# Patient Record
Sex: Female | Born: 1975 | Race: White | Hispanic: No | Marital: Single | Smoking: Never smoker
Health system: Southern US, Community
[De-identification: ages and names within clinical notes are randomized; demographics above are authoritative.]

## PROBLEM LIST (undated history)

## (undated) DIAGNOSIS — I422 Other hypertrophic cardiomyopathy: Secondary | ICD-10-CM

## (undated) DIAGNOSIS — D649 Anemia, unspecified: Secondary | ICD-10-CM

## (undated) DIAGNOSIS — F32A Depression, unspecified: Secondary | ICD-10-CM

## (undated) DIAGNOSIS — M549 Dorsalgia, unspecified: Secondary | ICD-10-CM

## (undated) DIAGNOSIS — F419 Anxiety disorder, unspecified: Secondary | ICD-10-CM

## (undated) DIAGNOSIS — F329 Major depressive disorder, single episode, unspecified: Secondary | ICD-10-CM

## (undated) HISTORY — DX: Depression, unspecified: F32.A

## (undated) HISTORY — DX: Major depressive disorder, single episode, unspecified: F32.9

## (undated) HISTORY — DX: Anxiety disorder, unspecified: F41.9

## (undated) HISTORY — PX: OTHER SURGICAL HISTORY: SHX169

## (undated) HISTORY — PX: INNER EAR SURGERY: SHX679

---

## 2002-07-14 HISTORY — PX: ABDOMINAL SURGERY: SHX537

## 2002-07-14 HISTORY — PX: LAPAROSCOPIC GASTRIC BYPASS: SUR771

## 2004-08-11 ENCOUNTER — Emergency Department: Payer: Self-pay | Admitting: Emergency Medicine

## 2004-11-15 ENCOUNTER — Ambulatory Visit: Payer: Self-pay | Admitting: Otolaryngology

## 2005-11-06 ENCOUNTER — Other Ambulatory Visit: Payer: Self-pay

## 2005-11-06 ENCOUNTER — Emergency Department: Payer: Self-pay | Admitting: Emergency Medicine

## 2006-12-01 ENCOUNTER — Emergency Department: Payer: Self-pay | Admitting: Emergency Medicine

## 2007-05-15 ENCOUNTER — Emergency Department: Payer: Self-pay | Admitting: Emergency Medicine

## 2007-09-10 ENCOUNTER — Inpatient Hospital Stay: Payer: Self-pay | Admitting: *Deleted

## 2007-09-10 ENCOUNTER — Other Ambulatory Visit: Payer: Self-pay

## 2007-12-15 ENCOUNTER — Emergency Department: Payer: Self-pay | Admitting: Emergency Medicine

## 2007-12-15 ENCOUNTER — Other Ambulatory Visit: Payer: Self-pay

## 2008-04-05 ENCOUNTER — Emergency Department: Payer: Self-pay | Admitting: Emergency Medicine

## 2008-09-21 ENCOUNTER — Emergency Department: Payer: Self-pay | Admitting: Emergency Medicine

## 2009-09-12 ENCOUNTER — Emergency Department: Payer: Self-pay | Admitting: Emergency Medicine

## 2010-05-12 ENCOUNTER — Emergency Department: Payer: Self-pay | Admitting: Emergency Medicine

## 2010-10-29 ENCOUNTER — Emergency Department: Payer: Self-pay | Admitting: Emergency Medicine

## 2010-12-30 ENCOUNTER — Emergency Department: Payer: Self-pay | Admitting: Emergency Medicine

## 2011-03-28 ENCOUNTER — Emergency Department: Payer: Self-pay | Admitting: *Deleted

## 2011-08-27 ENCOUNTER — Emergency Department: Payer: Self-pay | Admitting: *Deleted

## 2012-07-17 ENCOUNTER — Emergency Department: Payer: Self-pay | Admitting: Emergency Medicine

## 2012-07-17 LAB — RAPID INFLUENZA A&B ANTIGENS

## 2014-06-05 ENCOUNTER — Encounter: Payer: Self-pay | Admitting: Family Medicine

## 2014-06-13 ENCOUNTER — Encounter: Payer: Self-pay | Admitting: Family Medicine

## 2014-09-11 ENCOUNTER — Emergency Department: Payer: Self-pay | Admitting: Emergency Medicine

## 2015-10-12 ENCOUNTER — Emergency Department
Admission: EM | Admit: 2015-10-12 | Discharge: 2015-10-12 | Disposition: A | Discharge status: Home / Self Care | Payer: Medicaid Other | Attending: Emergency Medicine | Admitting: Emergency Medicine

## 2015-10-12 ENCOUNTER — Emergency Department: Payer: Medicaid Other

## 2015-10-12 DIAGNOSIS — X500XXA Overexertion from strenuous movement or load, initial encounter: Secondary | ICD-10-CM | POA: Insufficient documentation

## 2015-10-12 DIAGNOSIS — Y9389 Activity, other specified: Secondary | ICD-10-CM | POA: Insufficient documentation

## 2015-10-12 DIAGNOSIS — Y9289 Other specified places as the place of occurrence of the external cause: Secondary | ICD-10-CM | POA: Insufficient documentation

## 2015-10-12 DIAGNOSIS — M5432 Sciatica, left side: Secondary | ICD-10-CM | POA: Insufficient documentation

## 2015-10-12 DIAGNOSIS — Y990 Civilian activity done for income or pay: Secondary | ICD-10-CM | POA: Insufficient documentation

## 2015-10-12 DIAGNOSIS — S39012A Strain of muscle, fascia and tendon of lower back, initial encounter: Secondary | ICD-10-CM

## 2015-10-12 MED ORDER — IBUPROFEN 800 MG PO TABS: 800.0000 mg | ORAL_TABLET | Freq: Three times a day (TID) | ORAL | Status: DC | PRN

## 2015-10-12 MED ORDER — DIAZEPAM 5 MG PO TABS: 5.0000 mg | ORAL_TABLET | Freq: Three times a day (TID) | ORAL | Status: DC | PRN

## 2015-10-12 MED ORDER — OXYCODONE-ACETAMINOPHEN 5-325 MG PO TABS
2.0000 | ORAL_TABLET | Freq: Once | ORAL | Status: AC
  Administered 2015-10-12: 2 via ORAL
  Filled 2015-10-12: qty 2

## 2015-10-12 MED ORDER — PREDNISONE 10 MG (21) PO TBPK: 10.0000 mg | ORAL_TABLET | Freq: Every day | ORAL | Status: DC

## 2015-10-12 NOTE — ED Provider Notes (Signed)
[facility] Emergency Department Provider Note     Time seen: ----------------------------------------- 7:20 AM on 10/12/2015 -----------------------------------------    I have reviewed the triage vital signs and the nursing notes.   HISTORY  Chief Complaint Back Pain    HPI Rebecca Lang is a 40 y.o. female who presents the ER for left-sided lower back pain that radiates down her buttock and leg. She reports a history of this on and off for years. Seemed to start around the time of an epidural during childbirth. She taken Tylenol without improvement. 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   No past medical history on file.  There are no active problems to display for this patient.   No past surgical history on file.  Allergies Review of patient's allergies indicates no known allergies.  Social History Social History  Substance Use Topics  . Smoking status: Not on file  . Smokeless tobacco: Not on file  . Alcohol Use: Not on file    Review of Systems Constitutional: Negative for fever. Gastrointestinal: Negative for abdominal pain, vomiting and diarrhea. Musculoskeletal: Positive for left-sided low back pain, left-sided radicular pain Skin: Negative for rash. Neurological: Negative for headaches, focal weakness or numbness.  10-point ROS otherwise negative.  ____________________________________________   PHYSICAL EXAM:  VITAL SIGNS: ED Triage Vitals  Enc Vitals Group     BP 10/12/15 0112 129/60 mmHg     Pulse Rate 10/12/15 0112 82     Resp 10/12/15 0112 18     Temp 10/12/15 0112 98.2 F (36.8 C)     Temp Source 10/12/15 0112 Oral     SpO2 10/12/15 0112 99 %     Weight 10/12/15 0112 205 lb (92.987 kg)     Height 10/12/15 0112 5\' 3"  (1.6 m)     Head Cir --      Peak Flow --      Pain Score 10/12/15 0113 10     Pain Loc --      Pain Edu? --      Excl. in GC? --     Constitutional: Alert and oriented. Well appearing and in no distress. Eyes: Conjunctivae are normal. PERRL. Normal extraocular movements. Musculoskeletal: Nontender with normal range of motion in all extremities. Negative cross straight leg raise, when I lift her left leg to proximal 45 she states it pulls in her lower back but does not increase radicular symptoms. Neurologic:  Normal speech and language. No gross focal neurologic deficits are appreciated. No sensory deficits are noted  Skin:  Skin is warm, dry and intact. No rash noted. Psychiatric: Mood and affect are normal. Speech and behavior are normal. Patient exhibits appropriate insight and judgment.  ____________________________________________  ED COURSE:  Pertinent labs & imaging results that were available during my care of the patient were reviewed by me and considered in my medical decision making (see chart for details). Patient is in no acute distress, likely sciatica exacerbated by low back strain. I will obtain basic imaging of the low back. ____________________________________________   RADIOLOGY  Lumbar spine IMPRESSION: Stable scoliosis. No fracture or spondylolisthesis. No appreciable disc space narrowing. ____________________________________________  FINAL ASSESSMENT AND PLAN  Mild sciatica, lumbosacral strain  Plan: Patient with imaging as dictated above. Patient will be discharged with prednisone, Motrin and Valium. We will refer to orthopedics for follow-up. She may need an MRI for symptoms persist.   Emily FilbertWilliams, Assia Meanor E, MD  Emily Filbert, MD 10/12/15 [phone]

## 2015-10-12 NOTE — Discharge Instructions (Signed)
Sciatica °Sciatica is pain, weakness, numbness, or tingling along the path of the sciatic nerve. The nerve starts in the lower back and runs down the back of each leg. The nerve controls the muscles in the lower leg and in the back of the knee, while also providing sensation to the back of the thigh, lower leg, and the sole of your foot. Sciatica is a symptom of another medical condition. For instance, nerve damage or certain conditions, such as a herniated disk or bone spur on the spine, pinch or put pressure on the sciatic nerve. This causes the pain, weakness, or other sensations normally associated with sciatica. Generally, sciatica only affects one side of the body. °CAUSES  °· Herniated or slipped disc. °· Degenerative disk disease. °· A pain disorder involving the narrow muscle in the buttocks (piriformis syndrome). °· Pelvic injury or fracture. °· Pregnancy. °· Tumor (rare). °SYMPTOMS  °Symptoms can vary from mild to very severe. The symptoms usually travel from the low back to the buttocks and down the back of the leg. Symptoms can include: °· Mild tingling or dull aches in the lower back, leg, or hip. °· Numbness in the back of the calf or sole of the foot. °· Burning sensations in the lower back, leg, or hip. °· Sharp pains in the lower back, leg, or hip. °· Leg weakness. °· Severe back pain inhibiting movement. °These symptoms may get worse with coughing, sneezing, laughing, or prolonged sitting or standing. Also, being overweight may worsen symptoms. °DIAGNOSIS  °Your caregiver will perform a physical exam to look for common symptoms of sciatica. He or she may ask you to do certain movements or activities that would trigger sciatic nerve pain. Other tests may be performed to find the cause of the sciatica. These may include: °· Blood tests. °· X-rays. °· Imaging tests, such as an MRI or CT scan. °TREATMENT  °Treatment is directed at the cause of the sciatic pain. Sometimes, treatment is not necessary  and the pain and discomfort goes away on its own. If treatment is needed, your caregiver may suggest: °· Over-the-counter medicines to relieve pain. °· Prescription medicines, such as anti-inflammatory medicine, muscle relaxants, or narcotics. °· Applying heat or ice to the painful area. °· Steroid injections to lessen pain, irritation, and inflammation around the nerve. °· Reducing activity during periods of pain. °· Exercising and stretching to strengthen your abdomen and improve flexibility of your spine. Your caregiver may suggest losing weight if the extra weight makes the back pain worse. °· Physical therapy. °· Surgery to eliminate what is pressing or pinching the nerve, such as a bone spur or part of a herniated disk. °HOME CARE INSTRUCTIONS  °· Only take over-the-counter or prescription medicines for pain or discomfort as directed by your caregiver. °· Apply ice to the affected area for 20 minutes, 3-4 times a day for the first 48-72 hours. Then try heat in the same way. °· Exercise, stretch, or perform your usual activities if these do not aggravate your pain. °· Attend physical therapy sessions as directed by your caregiver. °· Keep all follow-up appointments as directed by your caregiver. °· Do not wear high heels or shoes that do not provide proper support. °· Check your mattress to see if it is too soft. A firm mattress may lessen your pain and discomfort. °SEEK IMMEDIATE MEDICAL CARE IF:  °· You lose control of your bowel or bladder (incontinence). °· You have increasing weakness in the lower back, pelvis, buttocks,   or legs.  You have redness or swelling of your back.  You have a burning sensation when you urinate.  You have pain that gets worse when you lie down or awakens you at night.  Your pain is worse than you have experienced in the past.  Your pain is lasting longer than 4 weeks.  You are suddenly losing weight without reason. MAKE SURE YOU:  Understand these  instructions.  Will watch your condition.  Will get help right away if you are not doing well or get worse.   This information is not intended to replace advice given to you by your health care provider. Make sure you discuss any questions you have with your health care provider.   Document Released: 06/24/2001 Document Revised: 03/21/2015 Document Reviewed: 11/09/2011 [employer] Interactive Patient Education 2016 [employer]  Low Back Sprain With Rehab A sprain is an injury in which a ligament is torn. The ligaments of the lower back are vulnerable to sprains. However, they are strong and require great force to be injured. These ligaments are important for stabilizing the spinal column. Sprains are classified into three categories. Grade 1 sprains cause pain, but the tendon is not lengthened. Grade 2 sprains include a lengthened ligament, due to the ligament being stretched or partially ruptured. With grade 2 sprains there is still function, although the function may be decreased. Grade 3 sprains involve a complete tear of the tendon or muscle, and function is usually impaired. SYMPTOMS   Severe pain in the lower back.  Sometimes, a feeling of a "pop," "snap," or tear, at the time of injury.  Tenderness and sometimes swelling at the injury site.  Uncommonly, bruising (contusion) within 48 hours of injury.  Muscle spasms in the back. CAUSES  Low back sprains occur when a force is placed on the ligaments that is greater than they can handle. Common causes of injury include:  Performing a stressful act while off-balance.  Repetitive stressful activities that involve movement of the lower back.  Direct hit (trauma) to the lower back. RISK INCREASES WITH:  Contact sports (football, wrestling).  Collisions (major skiing accidents).  Sports that require throwing or lifting (baseball, weightlifting).  Sports involving twisting of the spine (gymnastics, diving, tennis, golf).  Poor  strength and flexibility.  Inadequate protection.  Previous back injury or surgery (especially fusion). PREVENTION  Wear properly fitted and padded protective equipment.  Warm up and stretch properly before activity.  Allow for adequate recovery between workouts.  Maintain physical fitness:  Strength, flexibility, and endurance.  Cardiovascular fitness.  Maintain a healthy body weight. PROGNOSIS  If treated properly, low back sprains usually heal with non-surgical treatment. The length of time for healing depends on the severity of the injury.  RELATED COMPLICATIONS   Recurring symptoms, resulting in a chronic problem.  Chronic inflammation and pain in the low back.  Delayed healing or resolution of symptoms, especially if activity is resumed too soon.  Prolonged impairment.  Unstable or arthritic joints of the low back. TREATMENT  Treatment first involves the use of ice and medicine, to reduce pain and inflammation. The use of strengthening and stretching exercises may help reduce pain with activity. These exercises may be performed at home or with a therapist. Severe injuries may require referral to a therapist for further evaluation and treatment, such as ultrasound. Your caregiver may advise that you wear a back brace or corset, to help reduce pain and discomfort. Often, prolonged bed rest results in greater harm  then benefit. Corticosteroid injections may be recommended. However, these should be reserved for the most serious cases. It is important to avoid using your back when lifting objects. At night, sleep on your back on a firm mattress, with a pillow placed under your knees. If non-surgical treatment is unsuccessful, surgery may be needed.  MEDICATION   If pain medicine is needed, nonsteroidal anti-inflammatory medicines (aspirin and ibuprofen), or other minor pain relievers (acetaminophen), are often advised.  Do not take pain medicine for 7 days before  surgery.  Prescription pain relievers may be given, if your caregiver thinks they are needed. Use only as directed and only as much as you need.  Ointments applied to the skin may be helpful.  Corticosteroid injections may be given by your caregiver. These injections should be reserved for the most serious cases, because they may only be given a certain number of times. HEAT AND COLD  Cold treatment (icing) should be applied for 10 to 15 minutes every 2 to 3 hours for inflammation and pain, and immediately after activity that aggravates your symptoms. Use ice packs or an ice massage.  Heat treatment may be used before performing stretching and strengthening activities prescribed by your caregiver, physical therapist, or athletic trainer. Use a heat pack or a warm water soak. SEEK MEDICAL CARE IF:   Symptoms get worse or do not improve in 2 to 4 weeks, despite treatment.  You develop numbness or weakness in either leg.  You lose bowel or bladder function.  Any of the following occur after surgery: fever, increased pain, swelling, redness, drainage of fluids, or bleeding in the affected area.  New, unexplained symptoms develop. (Drugs used in treatment may produce side effects.) EXERCISES  RANGE OF MOTION (ROM) AND STRETCHING EXERCISES - Low Back Sprain Most people with lower back pain will find that their symptoms get worse with excessive bending forward (flexion) or arching at the lower back (extension). The exercises that will help resolve your symptoms will focus on the opposite motion.  Your physician, physical therapist or athletic trainer will help you determine which exercises will be most helpful to resolve your lower back pain. Do not complete any exercises without first consulting with your caregiver. Discontinue any exercises which make your symptoms worse, until you speak to your caregiver. If you have pain, numbness or tingling which travels down into your buttocks, leg or  foot, the goal of the therapy is for these symptoms to move closer to your back and eventually resolve. Sometimes, these leg symptoms will get better, but your lower back pain may worsen. This is often an indication of progress in your rehabilitation. Be very alert to any changes in your symptoms and the activities in which you participated in the 24 hours prior to the change. Sharing this information with your caregiver will allow him or her to most efficiently treat your condition. These exercises may help you when beginning to rehabilitate your injury. Your symptoms may resolve with or without further involvement from your physician, physical therapist or athletic trainer. While completing these exercises, remember:   Restoring tissue flexibility helps normal motion to return to the joints. This allows healthier, less painful movement and activity.  An effective stretch should be held for at least 30 seconds.  A stretch should never be painful. You should only feel a gentle lengthening or release in the stretched tissue. FLEXION RANGE OF MOTION AND STRETCHING EXERCISES: STRETCH - Flexion, Single Knee to Chest   Lie on a  firm bed or floor with both legs extended in front of you.  Keeping one leg in contact with the floor, bring your opposite knee to your chest. Hold your leg in place by either grabbing behind your thigh or at your knee.  Pull until you feel a gentle stretch in your low back. Hold __________ seconds.  Slowly release your grasp and repeat the exercise with the opposite side. Repeat __________ times. Complete this exercise __________ times per day.  STRETCH - Flexion, Double Knee to Chest  Lie on a firm bed or floor with both legs extended in front of you.  Keeping one leg in contact with the floor, bring your opposite knee to your chest.  Tense your stomach muscles to support your back and then lift your other knee to your chest. Hold your legs in place by either grabbing  behind your thighs or at your knees.  Pull both knees toward your chest until you feel a gentle stretch in your low back. Hold __________ seconds.  Tense your stomach muscles and slowly return one leg at a time to the floor. Repeat __________ times. Complete this exercise __________ times per day.  STRETCH - Low Trunk Rotation  Lie on a firm bed or floor. Keeping your legs in front of you, bend your knees so they are both pointed toward the ceiling and your feet are flat on the floor.  Extend your arms out to the side. This will stabilize your upper body by keeping your shoulders in contact with the floor.  Gently and slowly drop both knees together to one side until you feel a gentle stretch in your low back. Hold for __________ seconds.  Tense your stomach muscles to support your lower back as you bring your knees back to the starting position. Repeat the exercise to the other side. Repeat __________ times. Complete this exercise __________ times per day  EXTENSION RANGE OF MOTION AND FLEXIBILITY EXERCISES: STRETCH - Extension, Prone on Elbows   Lie on your stomach on the floor, a bed will be too soft. Place your palms about shoulder width apart and at the height of your head.  Place your elbows under your shoulders. If this is too painful, stack pillows under your chest.  Allow your body to relax so that your hips drop lower and make contact more completely with the floor.  Hold this position for __________ seconds.  Slowly return to lying flat on the floor. Repeat __________ times. Complete this exercise __________ times per day.  RANGE OF MOTION - Extension, Prone Press Ups  Lie on your stomach on the floor, a bed will be too soft. Place your palms about shoulder width apart and at the height of your head.  Keeping your back as relaxed as possible, slowly straighten your elbows while keeping your hips on the floor. You may adjust the placement of your hands to maximize your  comfort. As you gain motion, your hands will come more underneath your shoulders.  Hold this position __________ seconds.  Slowly return to lying flat on the floor. Repeat __________ times. Complete this exercise __________ times per day.  RANGE OF MOTION- Quadruped, Neutral Spine   Assume a hands and knees position on a firm surface. Keep your hands under your shoulders and your knees under your hips. You may place padding under your knees for comfort.  Drop your head and point your tailbone toward the ground below you. This will round out your lower back like an  angry cat. Hold this position for __________ seconds.  Slowly lift your head and release your tail bone so that your back sags into a large arch, like an old horse.  Hold this position for __________ seconds.  Repeat this until you feel limber in your low back.  Now, find your "sweet spot." This will be the most comfortable position somewhere between the two previous positions. This is your neutral spine. Once you have found this position, tense your stomach muscles to support your low back.  Hold this position for __________ seconds. Repeat __________ times. Complete this exercise __________ times per day.  STRENGTHENING EXERCISES - Low Back Sprain These exercises may help you when beginning to rehabilitate your injury. These exercises should be done near your "sweet spot." This is the neutral, low-back arch, somewhere between fully rounded and fully arched, that is your least painful position. When performed in this safe range of motion, these exercises can be used for people who have either a flexion or extension based injury. These exercises may resolve your symptoms with or without further involvement from your physician, physical therapist or athletic trainer. While completing these exercises, remember:   Muscles can gain both the endurance and the strength needed for everyday activities through controlled  exercises.  Complete these exercises as instructed by your physician, physical therapist or athletic trainer. Increase the resistance and repetitions only as guided.  You may experience muscle soreness or fatigue, but the pain or discomfort you are trying to eliminate should never worsen during these exercises. If this pain does worsen, stop and make certain you are following the directions exactly. If the pain is still present after adjustments, discontinue the exercise until you can discuss the trouble with your caregiver. STRENGTHENING - Deep Abdominals, Pelvic Tilt   Lie on a firm bed or floor. Keeping your legs in front of you, bend your knees so they are both pointed toward the ceiling and your feet are flat on the floor.  Tense your lower abdominal muscles to press your low back into the floor. This motion will rotate your pelvis so that your tail bone is scooping upwards rather than pointing at your feet or into the floor. With a gentle tension and even breathing, hold this position for __________ seconds. Repeat __________ times. Complete this exercise __________ times per day.  STRENGTHENING - Abdominals, Crunches   Lie on a firm bed or floor. Keeping your legs in front of you, bend your knees so they are both pointed toward the ceiling and your feet are flat on the floor. Cross your arms over your chest.  Slightly tip your chin down without bending your neck.  Tense your abdominals and slowly lift your trunk high enough to just clear your shoulder blades. Lifting higher can put excessive stress on the lower back and does not further strengthen your abdominal muscles.  Control your return to the starting position. Repeat __________ times. Complete this exercise __________ times per day.  STRENGTHENING - Quadruped, Opposite UE/LE Lift   Assume a hands and knees position on a firm surface. Keep your hands under your shoulders and your knees under your hips. You may place padding under  your knees for comfort.  Find your neutral spine and gently tense your abdominal muscles so that you can maintain this position. Your shoulders and hips should form a rectangle that is parallel with the floor and is not twisted.  Keeping your trunk steady, lift your right hand no higher than your  shoulder and then your left leg no higher than your hip. Make sure you are not holding your breath. Hold this position for __________ seconds.  Continuing to keep your abdominal muscles tense and your back steady, slowly return to your starting position. Repeat with the opposite arm and leg. Repeat __________ times. Complete this exercise __________ times per day.  STRENGTHENING - Abdominals and Quadriceps, Straight Leg Raise   Lie on a firm bed or floor with both legs extended in front of you.  Keeping one leg in contact with the floor, bend the other knee so that your foot can rest flat on the floor.  Find your neutral spine, and tense your abdominal muscles to maintain your spinal position throughout the exercise.  Slowly lift your straight leg off the floor about 6 inches for a count of 15, making sure to not hold your breath.  Still keeping your neutral spine, slowly lower your leg all the way to the floor. Repeat this exercise with each leg __________ times. Complete this exercise __________ times per day. POSTURE AND BODY MECHANICS CONSIDERATIONS - Low Back Sprain Keeping correct posture when sitting, standing or completing your activities will reduce the stress put on different body tissues, allowing injured tissues a chance to heal and limiting painful experiences. The following are general guidelines for improved posture. Your physician or physical therapist will provide you with any instructions specific to your needs. While reading these guidelines, remember:  The exercises prescribed by your provider will help you have the flexibility and strength to maintain correct postures.  The  correct posture provides the best environment for your joints to work. All of your joints have less wear and tear when properly supported by a spine with good posture. This means you will experience a healthier, less painful body.  Correct posture must be practiced with all of your activities, especially prolonged sitting and standing. Correct posture is as important when doing repetitive low-stress activities (typing) as it is when doing a single heavy-load activity (lifting). RESTING POSITIONS Consider which positions are most painful for you when choosing a resting position. If you have pain with flexion-based activities (sitting, bending, stooping, squatting), choose a position that allows you to rest in a less flexed posture. You would want to avoid curling into a fetal position on your side. If your pain worsens with extension-based activities (prolonged standing, working overhead), avoid resting in an extended position such as sleeping on your stomach. Most people will find more comfort when they rest with their spine in a more neutral position, neither too rounded nor too arched. Lying on a non-sagging bed on your side with a pillow between your knees, or on your back with a pillow under your knees will often provide some relief. Keep in mind, being in any one position for a prolonged period of time, no matter how correct your posture, can still lead to stiffness. PROPER SITTING POSTURE In order to minimize stress and discomfort on your spine, you must sit with correct posture. Sitting with good posture should be effortless for a healthy body. Returning to good posture is a gradual process. Many people can work toward this most comfortably by using various supports until they have the flexibility and strength to maintain this posture on their own. When sitting with proper posture, your ears will fall over your shoulders and your shoulders will fall over your hips. You should use the back of the chair  to support your upper back. Your lower back  will be in a neutral position, just slightly arched. You may place a small pillow or folded towel at the base of your lower back for  support.  When working at a desk, create an environment that supports good, upright posture. Without extra support, muscles tire, which leads to excessive strain on joints and other tissues. Keep these recommendations in mind: CHAIR:  A chair should be able to slide under your desk when your back makes contact with the back of the chair. This allows you to work closely.  The chair's height should allow your eyes to be level with the upper part of your monitor and your hands to be slightly lower than your elbows. BODY POSITION  Your feet should make contact with the floor. If this is not possible, use a foot rest.  Keep your ears over your shoulders. This will reduce stress on your neck and low back. INCORRECT SITTING POSTURES  If you are feeling tired and unable to assume a healthy sitting posture, do not slouch or slump. This puts excessive strain on your back tissues, causing more damage and pain. Healthier options include:  Using more support, like a lumbar pillow.  Switching tasks to something that requires you to be upright or walking.  Talking a brief walk.  Lying down to rest in a neutral-spine position. PROLONGED STANDING WHILE SLIGHTLY LEANING FORWARD  When completing a task that requires you to lean forward while standing in one place for a long time, place either foot up on a stationary 2-4 inch high object to help maintain the best posture. When both feet are on the ground, the lower back tends to lose its slight inward curve. If this curve flattens (or becomes too large), then the back and your other joints will experience too much stress, tire more quickly, and can cause pain. CORRECT STANDING POSTURES Proper standing posture should be assumed with all daily activities, even if they only take a few  moments, like when brushing your teeth. As in sitting, your ears should fall over your shoulders and your shoulders should fall over your hips. You should keep a slight tension in your abdominal muscles to brace your spine. Your tailbone should point down to the ground, not behind your body, resulting in an over-extended swayback posture.  INCORRECT STANDING POSTURES  Common incorrect standing postures include a forward head, locked knees and/or an excessive swayback. WALKING Walk with an upright posture. Your ears, shoulders and hips should all line-up. PROLONGED ACTIVITY IN A FLEXED POSITION When completing a task that requires you to bend forward at your waist or lean over a low surface, try to find a way to stabilize 3 out of 4 of your limbs. You can place a hand or elbow on your thigh or rest a knee on the surface you are reaching across. This will provide you more stability, so that your muscles do not tire as quickly. By keeping your knees relaxed, or slightly bent, you will also reduce stress across your lower back. CORRECT LIFTING TECHNIQUES DO :  Assume a wide stance. This will provide you more stability and the opportunity to get as close as possible to the object which you are lifting.  Tense your abdominals to brace your spine. Bend at the knees and hips. Keeping your back locked in a neutral-spine position, lift using your leg muscles. Lift with your legs, keeping your back straight.  Test the weight of unknown objects before attempting to lift them.  Try to  keep your elbows locked down at your sides in order get the best strength from your shoulders when carrying an object.  Always ask for help when lifting heavy or awkward objects. INCORRECT LIFTING TECHNIQUES DO NOT:   Lock your knees when lifting, even if it is a small object.  Bend and twist. Pivot at your feet or move your feet when needing to change directions.  Assume that you can safely pick up even a paperclip  without proper posture.   This information is not intended to replace advice given to you by your health care provider. Make sure you discuss any questions you have with your health care provider.   Document Released: 06/30/2005 Document Revised: 07/21/2014 Document Reviewed: 10/12/2008 Elsevier Interactive Patient Education [employer]

## 2015-10-12 NOTE — ED Notes (Signed)
Patient transported to X-ray 

## 2015-10-12 NOTE — ED Notes (Signed)
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

## 2015-10-12 NOTE — ED Notes (Signed)
Pt in with left lower back  Pain radiating to left buttocks, hx of the same for few years since epidural.

## 2015-11-03 ENCOUNTER — Encounter: Payer: Self-pay | Admitting: Emergency Medicine

## 2015-11-03 DIAGNOSIS — R6883 Chills (without fever): Secondary | ICD-10-CM | POA: Insufficient documentation

## 2015-11-03 DIAGNOSIS — L03115 Cellulitis of right lower limb: Secondary | ICD-10-CM | POA: Insufficient documentation

## 2015-11-03 DIAGNOSIS — L03116 Cellulitis of left lower limb: Secondary | ICD-10-CM | POA: Insufficient documentation

## 2015-11-03 LAB — CBC WITH DIFFERENTIAL/PLATELET
Basophils Absolute: 0.1 10*3/uL (ref 0–0.1)
Basophils Relative: 0 %
Eosinophils Absolute: 0 10*3/uL (ref 0–0.7)
Eosinophils Relative: 0 %
HEMATOCRIT: 27.8 % — AB (ref 35.0–47.0)
HEMOGLOBIN: 8.3 g/dL — AB (ref 12.0–16.0)
LYMPHS ABS: 0.8 10*3/uL — AB (ref 1.0–3.6)
Lymphocytes Relative: 6 %
MCH: 19.1 pg — AB (ref 26.0–34.0)
MCHC: 29.8 g/dL — AB (ref 32.0–36.0)
MCV: 64.3 fL — ABNORMAL LOW (ref 80.0–100.0)
MONOS PCT: 4 %
Monocytes Absolute: 0.6 10*3/uL (ref 0.2–0.9)
NEUTROS ABS: 12.4 10*3/uL — AB (ref 1.4–6.5)
NEUTROS PCT: 90 %
Platelets: 446 10*3/uL — ABNORMAL HIGH (ref 150–440)
RBC: 4.33 MIL/uL (ref 3.80–5.20)
RDW: 18.4 % — ABNORMAL HIGH (ref 11.5–14.5)
WBC: 13.9 10*3/uL — ABNORMAL HIGH (ref 3.6–11.0)

## 2015-11-03 LAB — COMPREHENSIVE METABOLIC PANEL
ALT: 18 U/L (ref 14–54)
ANION GAP: 7 (ref 5–15)
AST: 28 U/L (ref 15–41)
Albumin: 4 g/dL (ref 3.5–5.0)
Alkaline Phosphatase: 95 U/L (ref 38–126)
BUN: 11 mg/dL (ref 6–20)
CHLORIDE: 107 mmol/L (ref 101–111)
CO2: 24 mmol/L (ref 22–32)
Calcium: 9 mg/dL (ref 8.9–10.3)
Creatinine, Ser: 0.57 mg/dL (ref 0.44–1.00)
GFR calc non Af Amer: >60 mL/min (ref >60)
Glucose, Bld: 101 mg/dL — ABNORMAL HIGH (ref 65–99)
Potassium: 3.7 mmol/L (ref 3.5–5.1)
SODIUM: 138 mmol/L (ref 135–145)
Total Bilirubin: 0.3 mg/dL (ref 0.3–1.2)
Total Protein: 7.6 g/dL (ref 6.5–8.1)

## 2015-11-03 LAB — URINALYSIS COMPLETE WITH MICROSCOPIC (ARMC ONLY)
Bilirubin Urine: NEGATIVE
Glucose, UA: NEGATIVE mg/dL
Hgb urine dipstick: NEGATIVE
Ketones, ur: NEGATIVE mg/dL
Leukocytes, UA: NEGATIVE
Nitrite: NEGATIVE
Protein, ur: NEGATIVE mg/dL
SPECIFIC GRAVITY, URINE: 1.024 (ref 1.005–1.030)
pH: 5 (ref 5.0–8.0)

## 2015-11-03 LAB — POCT PREGNANCY, URINE: PREG TEST UR: NEGATIVE

## 2015-11-03 NOTE — ED Notes (Signed)
Pt ambulatory to triage c/o being cold and numbness d/t cold in fingertips and toes.  Sensation and circulation intact to fingertips and toes.  Pt reports being at work and suddenly reports becoming cold to the point of shaking.    Pt with no acute distress, slow to ambulate however, denies fever, HA N/V/D

## 2015-11-04 ENCOUNTER — Emergency Department
Admission: EM | Admit: 2015-11-04 | Discharge: 2015-11-04 | Disposition: A | Discharge status: Home / Self Care | Payer: Medicaid Other | Attending: Emergency Medicine | Admitting: Emergency Medicine

## 2015-11-04 DIAGNOSIS — R202 Paresthesia of skin: Secondary | ICD-10-CM

## 2015-11-04 DIAGNOSIS — R6883 Chills (without fever): Secondary | ICD-10-CM

## 2015-11-04 DIAGNOSIS — L03119 Cellulitis of unspecified part of limb: Secondary | ICD-10-CM

## 2015-11-04 HISTORY — DX: Anemia, unspecified: D64.9

## 2015-11-04 HISTORY — DX: Dorsalgia, unspecified: M54.9

## 2015-11-04 MED ORDER — CLINDAMYCIN HCL 300 MG PO CAPS: 300.0000 mg | ORAL_CAPSULE | Freq: Three times a day (TID) | ORAL | Status: DC

## 2015-11-04 MED ORDER — DIPHENHYDRAMINE HCL 25 MG PO CAPS
25.0000 mg | ORAL_CAPSULE | Freq: Once | ORAL | Status: AC
  Administered 2015-11-04: 25 mg via ORAL
  Filled 2015-11-04: qty 1

## 2015-11-04 MED ORDER — CLINDAMYCIN HCL 150 MG PO CAPS
300.0000 mg | ORAL_CAPSULE | Freq: Once | ORAL | Status: AC
  Administered 2015-11-04: 300 mg via ORAL
  Filled 2015-11-04: qty 2

## 2015-11-04 NOTE — ED Provider Notes (Signed)
[facility] Emergency Department Provider Note  ____________________________________________  Time seen: Approximately 343 AM  I have reviewed the triage vital signs and the nursing notes.   HISTORY  Chief Complaint Numbness and Chills    HPI Rebecca Lang is a 40 y.o. female who comes into the hospital today with a rash to her lower extremities and numbness in her fingers and toes. The patient reports initially she didn't have any and station her fingers in her feet. She also was cold and shivering. She reports that when she came back into the room she noticed the rash on her legs. The numbness started about 45 minutes prior to her coming into the emergency department after 9 PM. The patient reports that she was at work doing normal job duties. The symptoms aren't all of a sudden. She's never had these symptoms before and she denies any weakness with the symptoms. The patient does have some fatigue but reports that she lives a highly stressful life with a 58-year-old full-time job. The patient reports that the legs are itchy and red. Initially she didn't notice it but it became itchy afterwards. The patient has been maintaining herself for the past 2 weeks in the tanning bed in preparation for a trip to the beach.The patient's finger numbness has resolved.   Past Medical History  Diagnosis Date  . Anemia   . Back pain     There are no active problems to display for this patient.   Past Surgical History  Procedure Laterality Date  . Cesarean section    . Abdominal surgery  2004    gastric bypass  . Tubal rupture      Current Outpatient Rx  Name  Route  Sig  Dispense  Refill  . diazepam (VALIUM) 5 MG tablet   Oral   Take 1 tablet (5 mg total) by mouth every 8 (eight) hours as needed for muscle spasms.   20 tablet   0   . ibuprofen (ADVIL,MOTRIN) 800 MG tablet   Oral   Take 1 tablet (800 mg total) by mouth every 8 (eight) hours as needed.   30  tablet   0   . clindamycin (CLEOCIN) 300 MG capsule   Oral   Take 1 capsule (300 mg total) by mouth 3 (three) times daily.   30 capsule   0     Allergies Review of patient's allergies indicates no known allergies.  History reviewed. No pertinent family history.  Social History Social History  Substance Use Topics  . Smoking status: Never Smoker   . Smokeless tobacco: None  . Alcohol Use: No    Review of Systems Constitutional: No fever/chills Eyes: No visual changes. ENT: No sore throat. Cardiovascular: Denies chest pain. Respiratory: Denies shortness of breath. Gastrointestinal: No abdominal pain.  No nausea, no vomiting.  No diarrhea.  No constipation. Genitourinary: Negative for dysuria. Musculoskeletal: Negative for back pain. Skin:  rash. Neurological: Numbness to hands and feet  10-point ROS otherwise negative.  ____________________________________________   PHYSICAL EXAM:  VITAL SIGNS: ED Triage Vitals  Enc Vitals Group     BP 11/03/15 2158 162/72 mmHg     Pulse Rate 11/03/15 2158 90     Resp 11/03/15 2158 18     Temp 11/03/15 2158 98.7 F (37.1 C)     Temp Source 11/03/15 2158 Oral     SpO2 11/03/15 2158 99 %     Weight 11/03/15 2158 200 lb (90.719 kg)  Height 11/03/15 2158 5\' 3"  (1.6 m)     Head Cir --      Peak Flow --      Pain Score 11/03/15 2159 0     Pain Loc --      Pain Edu? --      Excl. in GC? --     Constitutional: Alert and oriented. Well appearing and in Mild distress. Eyes: Conjunctivae are normal. PERRL. EOMI. Head: Atraumatic. Nose: No congestion/rhinnorhea. Mouth/Throat: Mucous membranes are moist.  Oropharynx non-erythematous. Cardiovascular: Normal rate, regular rhythm. Grossly normal heart sounds.  Good peripheral circulation. Respiratory: Normal respiratory effort.  No retractions. Lungs CTAB. Gastrointestinal: Soft and nontender. No distention. Positive bowel sounds Musculoskeletal: No lower extremity tenderness  nor edema.   Neurologic:  Normal speech and language. Sensation intact throughout Skin:  Bilateral lower extremities with erythematous rash that is macular and hot to the touch. Noticed on the right lower extremity as well as on the left lower extremity. No blisters noted no bruising noted no urticaria noted.  Psychiatric: Mood and affect are normal.   ____________________________________________   LABS (all labs ordered are listed, but only abnormal results are displayed)  Labs Reviewed  COMPREHENSIVE METABOLIC PANEL - Abnormal; Notable for the following:    Glucose, Bld 101 (*)    All other components within normal limits  CBC WITH DIFFERENTIAL/PLATELET - Abnormal; Notable for the following:    WBC 13.9 (*)    Hemoglobin 8.3 (*)    HCT 27.8 (*)    MCV 64.3 (*)    MCH 19.1 (*)    MCHC 29.8 (*)    RDW 18.4 (*)    Platelets 446 (*)    Neutro Abs 12.4 (*)    Lymphs Abs 0.8 (*)    All other components within normal limits  URINALYSIS COMPLETEWITH MICROSCOPIC ([facility] ONLY) - Abnormal; Notable for the following:    Color, Urine YELLOW (*)    APPearance HAZY (*)    Bacteria, UA RARE (*)    Squamous Epithelial / LPF 0-5 (*)    All other components within normal limits  POCT PREGNANCY, URINE   ____________________________________________  EKG  none ____________________________________________  RADIOLOGY  none ____________________________________________   PROCEDURES  Procedure(s) performed: None  Critical Care performed: No  ____________________________________________   INITIAL IMPRESSION / ASSESSMENT AND PLAN / ED COURSE  Pertinent labs & imaging results that were available during my care of the patient were reviewed by me and considered in my medical decision making (see chart for details).  This is a 40 year old female who comes into the hospital today with chills a rash to her lower extremities and some numbness to her fingers and toes. Looking at the rash to  her legs. The patient may have some cellulitis. I did give the patient some Benadryl for itching and I'll give her some clindamycin. Otherwise the patient has no other concerns or complaints at this time. She is anemic at baseline. She'll be discharged to follow-up with her primary care physician. ____________________________________________   FINAL CLINICAL IMPRESSION(S) / ED DIAGNOSES  Final diagnoses:  Cellulitis of lower extremity, unspecified laterality  Chills  Paresthesia      Rebecka ApleyAllison P Webster, MD 11/04/15 [phone]

## 2015-11-04 NOTE — Discharge Instructions (Signed)
Cellulitis Cellulitis is an infection of the skin and the tissue beneath it. The infected area is usually red and tender. Cellulitis occurs most often in the arms and lower legs.  CAUSES  Cellulitis is caused by bacteria that enter the skin through cracks or cuts in the skin. The most common types of bacteria that cause cellulitis are staphylococci and streptococci. SIGNS AND SYMPTOMS   Redness and warmth.  Swelling.  Tenderness or pain.  Fever. DIAGNOSIS  Your health care provider can usually determine what is wrong based on a physical exam. Blood tests may also be done. TREATMENT  Treatment usually involves taking an antibiotic medicine. HOME CARE INSTRUCTIONS   Take your antibiotic medicine as directed by your health care provider. Finish the antibiotic even if you start to feel better.  Keep the infected arm or leg elevated to reduce swelling.  Apply a warm cloth to the affected area up to 4 times per day to relieve pain.  Take medicines only as directed by your health care provider.  Keep all follow-up visits as directed by your health care provider. SEEK MEDICAL CARE IF:   You notice red streaks coming from the infected area.  Your red area gets larger or turns dark in color.  Your bone or joint underneath the infected area becomes painful after the skin has healed.  Your infection returns in the same area or another area.  You notice a swollen bump in the infected area.  You develop new symptoms.  You have a fever. SEEK IMMEDIATE MEDICAL CARE IF:   You feel very sleepy.  You develop vomiting or diarrhea.  You have a general ill feeling (malaise) with muscle aches and pains.   This information is not intended to replace advice given to you by your health care provider. Make sure you discuss any questions you have with your health care provider.   Document Released: 04/09/2005 Document Revised: 03/21/2015 Document Reviewed: 09/15/2011 Elsevier Interactive  Patient Education 2016 [employer]  Paresthesia Paresthesia is a burning or prickling feeling. This feeling can happen in any part of the body. It often happens in the hands, arms, legs, or feet. Usually, it is not painful. In most cases, the feeling goes away in a short time and is not a sign of a serious problem. HOME CARE  Avoid drinking alcohol.  Try massage or needle therapy (acupuncture) to help with your problems.  Keep all follow-up visits as told by your doctor. This is important. GET HELP IF:  You keep on having episodes of paresthesia.  Your burning or prickling feeling gets worse when you walk.  You have pain or cramps.  You feel dizzy.  You have a rash. GET HELP RIGHT AWAY IF:  You feel weak.  You have trouble walking or moving.  You have problems speaking, understanding, or seeing.  You feel confused.  You cannot control when you pee (urinate) or poop (bowel movement).  You lose feeling (numbness) after an injury.  You pass out (faint).   This information is not intended to replace advice given to you by your health care provider. Make sure you discuss any questions you have with your health care provider.   Document Released: 06/12/2008 Document Revised: 11/14/2014 Document Reviewed: 06/26/2014 Elsevier Interactive Patient Education [employer]

## 2015-11-07 ENCOUNTER — Encounter: Payer: Self-pay | Admitting: Emergency Medicine

## 2015-11-07 ENCOUNTER — Emergency Department
Admission: EM | Admit: 2015-11-07 | Discharge: 2015-11-07 | Disposition: A | Discharge status: Home / Self Care | Payer: Medicaid Other | Attending: Emergency Medicine | Admitting: Emergency Medicine

## 2015-11-07 DIAGNOSIS — B373 Candidiasis of vulva and vagina: Secondary | ICD-10-CM | POA: Insufficient documentation

## 2015-11-07 DIAGNOSIS — B3731 Acute candidiasis of vulva and vagina: Secondary | ICD-10-CM

## 2015-11-07 DIAGNOSIS — R21 Rash and other nonspecific skin eruption: Secondary | ICD-10-CM | POA: Insufficient documentation

## 2015-11-07 MED ORDER — FAMOTIDINE 20 MG PO TABS
20.0000 mg | ORAL_TABLET | Freq: Once | ORAL | Status: AC
  Administered 2015-11-07: 20 mg via ORAL
  Filled 2015-11-07: qty 1

## 2015-11-07 MED ORDER — CEPHALEXIN 500 MG PO CAPS: 500.0000 mg | ORAL_CAPSULE | Freq: Four times a day (QID) | ORAL | Status: DC

## 2015-11-07 MED ORDER — FLUCONAZOLE 150 MG PO TABS: 150.0000 mg | ORAL_TABLET | ORAL | Status: DC

## 2015-11-07 MED ORDER — METHYLPREDNISOLONE SODIUM SUCC 125 MG IJ SOLR
125.0000 mg | Freq: Once | INTRAMUSCULAR | Status: AC
  Administered 2015-11-07: 125 mg via INTRAVENOUS
  Filled 2015-11-07: qty 2

## 2015-11-07 MED ORDER — DIPHENHYDRAMINE HCL 50 MG/ML IJ SOLN
50.0000 mg | Freq: Once | INTRAMUSCULAR | Status: AC
  Administered 2015-11-07: 50 mg via INTRAMUSCULAR
  Filled 2015-11-07: qty 1

## 2015-11-07 MED ORDER — TRIAMCINOLONE ACETONIDE 0.1 % EX CREA: 1.0000 "application " | TOPICAL_CREAM | Freq: Four times a day (QID) | CUTANEOUS | Status: DC

## 2015-11-07 NOTE — Discharge Instructions (Signed)
Drug Rash A drug rash is a change in the color or texture of the skin that is caused by a drug. It can develop minutes, hours, or days after the person takes the drug. CAUSES This condition is usually caused by a drug allergy. It can also be caused by exposure to sunlight after taking a drug that makes the skin sensitive to light. Drugs that commonly cause rashes include:  Penicillin.  Antibiotic medicines.  Medicines that treat seizures.  Medicines that treat cancer (chemotherapy).  Aspirin and other nonsteroidal anti-inflammatory drugs (NSAIDs).  Injectable dyes that contain iodine.  Insulin. SYMPTOMS Symptoms of this condition include:  Redness.  Tiny bumps.  Peeling.  Itching.  Itchy welts (hives).  Swelling. The rash may appear on a small area of skin or all over the body. DIAGNOSIS To diagnose the condition, your health care provider will do a physical exam. He or she may also order tests to find out which drug caused the rash. Tests to find the cause of a rash include:  Skin tests.  Blood tests.  Drug challenge. For this test, you stop taking all of the drugs that you do not need to take, and then you start taking them again by adding back one of the drugs at a time. TREATMENT A drug rash may be treated with medicines, including:  Antihistamines. These may be given to relieve itching.  An NSAID. This may be given to reduce swelling and treat pain.  A steroid drug. This may be given to reduce swelling. The rash usually goes away when the person stops taking the drug that caused it. HOME CARE INSTRUCTIONS  Take medicines only as directed by your health care provider.  Let all of your health care providers know about any drug reactions you have had in the past.  If you have hives, take a cool shower or use a cool compress to relieve itchiness. SEEK MEDICAL CARE IF:  You have a fever.  Your rash is not going away.  Your rash gets worse.  Your rash  comes back.  You have wheezing or coughing. SEEK IMMEDIATE MEDICAL CARE IF:  You start to have breathing problems.  You start to have shortness of breath.  You face or throat starts to swell.  You have severe weakness with dizziness or fainting.  You have chest pain.   This information is not intended to replace advice given to you by your health care provider. Make sure you discuss any questions you have with your health care provider.   Document Released: 08/07/2004 Document Revised: 07/21/2014 Document Reviewed: 04/26/2014 Elsevier Interactive Patient Education 2016 [employer]  Vaginitis Vaginitis is an inflammation of the vagina. It is most often caused by a change in the normal balance of the bacteria and yeast that live in the vagina. This change in balance causes an overgrowth of certain bacteria or yeast, which causes the inflammation. There are different types of vaginitis, but the most common types are:  Bacterial vaginosis.  Yeast infection (candidiasis).  Trichomoniasis vaginitis. This is a sexually transmitted infection (STI).  Viral vaginitis.  Atrophic vaginitis.  Allergic vaginitis. CAUSES  The cause depends on the type of vaginitis. Vaginitis can be caused by:  Bacteria (bacterial vaginosis).  Yeast (yeast infection).  A parasite (trichomoniasis vaginitis)  A virus (viral vaginitis).  Low hormone levels (atrophic vaginitis). Low hormone levels can occur during pregnancy, breastfeeding, or after menopause.  Irritants, such as bubble baths, scented tampons, and feminine sprays (allergic vaginitis).  Other factors can change the normal balance of the yeast and bacteria that live in the vagina. These include:  Antibiotic medicines.  Poor hygiene.  Diaphragms, vaginal sponges, spermicides, birth control pills, and intrauterine devices (IUD).  Sexual intercourse.  Infection.  Uncontrolled diabetes.  A weakened immune system. SYMPTOMS    Symptoms can vary depending on the cause of the vaginitis. Common symptoms include:  Abnormal vaginal discharge.  The discharge is white, gray, or yellow with bacterial vaginosis.  The discharge is thick, white, and cheesy with a yeast infection.  The discharge is frothy and yellow or greenish with trichomoniasis.  A bad vaginal odor.  The odor is fishy with bacterial vaginosis.  Vaginal itching, pain, or swelling.  Painful intercourse.  Pain or burning when urinating. Sometimes, there are no symptoms. TREATMENT  Treatment will vary depending on the type of infection.   Bacterial vaginosis and trichomoniasis are often treated with antibiotic creams or pills.  Yeast infections are often treated with antifungal medicines, such as vaginal creams or suppositories.  Viral vaginitis has no cure, but symptoms can be treated with medicines that relieve discomfort. Your sexual partner should be treated as well.  Atrophic vaginitis may be treated with an estrogen cream, pill, suppository, or vaginal ring. If vaginal dryness occurs, lubricants and moisturizing creams may help. You may be told to avoid scented soaps, sprays, or douches.  Allergic vaginitis treatment involves quitting the use of the product that is causing the problem. Vaginal creams can be used to treat the symptoms. HOME CARE INSTRUCTIONS   Take all medicines as directed by your caregiver.  Keep your genital area clean and dry. Avoid soap and only rinse the area with water.  Avoid douching. It can remove the healthy bacteria in the vagina.  Do not use tampons or have sexual intercourse until your vaginitis has been treated. Use sanitary pads while you have vaginitis.  Wipe from front to back. This avoids the spread of bacteria from the rectum to the vagina.  Let air reach your genital area.  Wear cotton underwear to decrease moisture buildup.  Avoid wearing underwear while you sleep until your vaginitis is  gone.  Avoid tight pants and underwear or nylons without a cotton panel.  Take off wet clothing (especially bathing suits) as soon as possible.  Use mild, non-scented products. Avoid using irritants, such as:  Scented feminine sprays.  Fabric softeners.  Scented detergents.  Scented tampons.  Scented soaps or bubble baths.  Practice safe sex and use condoms. Condoms may prevent the spread of trichomoniasis and viral vaginitis. SEEK MEDICAL CARE IF:   You have abdominal pain.  You have a fever or persistent symptoms for more than 2-3 days.  You have a fever and your symptoms suddenly get worse.   This information is not intended to replace advice given to you by your health care provider. Make sure you discuss any questions you have with your health care provider.   Document Released: 04/27/2007 Document Revised: 11/14/2014 Document Reviewed: 12/11/2011 Elsevier Interactive Patient Education [employer]

## 2015-11-07 NOTE — ED Provider Notes (Signed)
[facility] Emergency Department Provider Note  ____________________________________________  Time seen: Approximately 10:14 PM  I have reviewed the triage vital signs and the nursing notes.   HISTORY  Chief Complaint No chief complaint on file.    HPI Rebecca Lang is a 40 y.o. female who presents emergency department for complaint of itching and rash. Patient was seen in this department on 11/04/2015 for complaint of rash to her lower extremities. Patient was diagnosed with cellulitis of the time and was placed on clindamycin. Patient states that the initial rash to her lower extremities has been clearing but states that early this morning and throughout the day she has developed a rash to the right thigh as well as left upper arm. Patient reports that she is also experiencing some generalized pruritus without rash. Patient denies any difficulty breathing, wheezing, oral swelling. Patient has taken Benadryl with some mild relief.  Patient also reports having a yeast infection from antibiotic use and is requesting Diflucan for same.   Past Medical History  Diagnosis Date  . Anemia   . Back pain     There are no active problems to display for this patient.   Past Surgical History  Procedure Laterality Date  . Cesarean section    . Abdominal surgery  2004    gastric bypass  . Tubal rupture      Current Outpatient Rx  Name  Route  Sig  Dispense  Refill  . cephALEXin (KEFLEX) 500 MG capsule   Oral   Take 1 capsule (500 mg total) by mouth 4 (four) times daily.   28 capsule   0   . clindamycin (CLEOCIN) 300 MG capsule   Oral   Take 1 capsule (300 mg total) by mouth 3 (three) times daily.   30 capsule   0   . diazepam (VALIUM) 5 MG tablet   Oral   Take 1 tablet (5 mg total) by mouth every 8 (eight) hours as needed for muscle spasms.   20 tablet   0   . fluconazole (DIFLUCAN) 150 MG tablet   Oral   Take 1 tablet (150 mg total) by mouth  as directed. Take 1 tablet now and 1 tablet after finishing antibiotics.   2 tablet   0   . ibuprofen (ADVIL,MOTRIN) 800 MG tablet   Oral   Take 1 tablet (800 mg total) by mouth every 8 (eight) hours as needed.   30 tablet   0   . triamcinolone cream (KENALOG) 0.1 %   Topical   Apply 1 application topically 4 (four) times daily.   30 g   0     Allergies Review of patient's allergies indicates no known allergies.  No family history on file.  Social History Social History  Substance Use Topics  . Smoking status: Never Smoker   . Smokeless tobacco: None  . Alcohol Use: No     Review of Systems  Constitutional: No fever/chills Cardiovascular: no chest pain. Respiratory: no cough. No SOB.No audible wheezing. Genitourinary: Patient reports having a yeast infection. Skin: Positive for rash to the right lateral side and left posterior arm. Neurological: Negative for headaches, focal weakness or numbness. 10-point ROS otherwise negative.  ____________________________________________   PHYSICAL EXAM:  VITAL SIGNS: ED Triage Vitals  Enc Vitals Group     BP 11/07/15 2203 137/65 mmHg     Pulse Rate 11/07/15 2203 81     Resp 11/07/15 2203 20     Temp  11/07/15 2203 98 F (36.7 C)     Temp Source 11/07/15 2203 Oral     SpO2 11/07/15 2203 100 %     Weight 11/07/15 2203 200 lb (90.719 kg)     Height 11/07/15 2203  (1.6 m)     Head Cir --      Peak Flow --      Pain Score --      Pain Loc --      Pain Edu? --      Excl. in GC? --      Constitutional: Alert and oriented. Well appearing and in no acute distress. Eyes: Conjunctivae are normal. PERRL. EOMI. Head: Atraumatic. ENT:      Mouth/Throat: Mucous membranes are moist. No oropharyngeal swelling. Uvula is midline. Neck: No stridor.   Hematological/Lymphatic/Immunilogical: No cervical lymphadenopathy. Cardiovascular: Normal rate, regular rhythm. Normal S1 and S2.  Good peripheral circulation. Respiratory:  Normal respiratory effort without tachypnea or retractions. Lungs CTAB. No wheezing. Good air entry into the bases. Neurologic:  Normal speech and language. No gross focal neurologic deficits are appreciated.  Skin:  Skin is warm, dry and intact. No visualized rash to lower extremities and previous distribution. There is a fine maculopapular rash to the right lateral thigh and left posterior upper arm. Rash to the right lateral thigh is approximately 6 cm in diameter. Area to the left upper arm is approximately 5 cm in diameter. Area is not firm to palpation. No fluctuance is noted. Psychiatric: Mood and affect are normal. Speech and behavior are normal. Patient exhibits appropriate insight and judgement.   ____________________________________________   LABS (all labs ordered are listed, but only abnormal results are displayed)  Labs Reviewed - No data to display ____________________________________________  EKG   ____________________________________________  RADIOLOGY   No results found.  ____________________________________________    PROCEDURES  Procedure(s) performed:       Medications  diphenhydrAMINE (BENADRYL) injection 50 mg (50 mg Intramuscular Given 11/07/15 2228)  methylPREDNISolone sodium succinate (SOLU-MEDROL) 125 mg/2 mL injection 125 mg (125 mg Intravenous Given 11/07/15 2229)  famotidine (PEPCID) tablet 20 mg (20 mg Oral Given 11/07/15 2228)     ____________________________________________   INITIAL IMPRESSION / ASSESSMENT AND PLAN / ED COURSE  Pertinent labs & imaging results that were available during my care of the patient were reviewed by me and considered in my medical decision making (see chart for details).  Patient's diagnosis is consistent with rash. Patient was seen 4 days prior and placed on clindamycin for cellulitis to her lower extremity's. While the cellulitis has been improving patient endorses a new rash and generalized pruritus. Upon  inspection of new rash, it appears to be more contact in nature versus wheals/hives from allergic reaction. There is no systemic complaints of shortness of breath, difficulty breathing, or oropharyngeal swelling. However, Patient will be treated with Benadryl, steroids, and famotidine here in the emergency department. Patient will also be switched from oral clindamycin to Keflex for the remainder of antibiotic treatment for her previous cellulitis.. Patient will be discharged home with prescriptions for Keflex and triamcinolone ointment. Patient is encouraged to continue using Benadryl as needed. Patient also endorses having a yeast infection from antibiotic use. Patient states that she has had these in the past and symptoms are consistent with same. Patient is offered pelvic exam with swab to determine use versus bacterial vaginosis and she declines at this time. Patient will be treated on a symptom based diagnosis with Diflucan for this infection.Marland Kitchen  Patient is to follow up with primary care provider if symptoms persist past this treatment course. Patient is given ED precautions to return to the ED for any worsening or new symptoms.     ____________________________________________  FINAL CLINICAL IMPRESSION(S) / ED DIAGNOSES  Final diagnoses:  Rash and nonspecific skin eruption  Vaginal candidiasis      NEW MEDICATIONS STARTED DURING THIS VISIT:  Discharge Medication List as of 11/07/2015 10:28 PM    START taking these medications   Details  cephALEXin (KEFLEX) 500 MG capsule Take 1 capsule (500 mg total) by mouth 4 (four) times daily., Starting 11/07/2015, Until Discontinued, Print    fluconazole (DIFLUCAN) 150 MG tablet Take 1 tablet (150 mg total) by mouth as directed. Take 1 tablet now and 1 tablet after finishing antibiotics., Starting 11/07/2015, Until Discontinued, Print    triamcinolone cream (KENALOG) 0.1 % Apply 1 application topically 4 (four) times daily., Starting 11/07/2015, Until  Discontinued, Print            This chart was dictated using voice recognition software/Dragon. Despite best efforts to proofread, errors can occur which can change the meaning. Any change was purely unintentional.    Delorise RoyalsJonathan D Cuthriell, PA-C 11/08/15 0000  Rockne MenghiniAnne-Caroline Norman, MD 11/13/15 1530

## 2015-11-07 NOTE — ED Notes (Addendum)
Patient ambulatory to triage with steady gait, without difficulty or distress noted; pt reports generalized itching, st unsure if reaction to clindamycin; took 2-25mg  benadryl at 4pm

## 2016-05-25 ENCOUNTER — Encounter: Payer: Self-pay | Admitting: Emergency Medicine

## 2016-05-25 ENCOUNTER — Emergency Department
Admission: EM | Admit: 2016-05-25 | Discharge: 2016-05-25 | Disposition: A | Discharge status: Home / Self Care | Payer: Self-pay | Attending: Emergency Medicine | Admitting: Emergency Medicine

## 2016-05-25 ENCOUNTER — Emergency Department
Admission: EM | Admit: 2016-05-25 | Discharge: 2016-05-26 | Disposition: A | Discharge status: Home / Self Care | Payer: Self-pay | Attending: Emergency Medicine | Admitting: Emergency Medicine

## 2016-05-25 DIAGNOSIS — Z791 Long term (current) use of non-steroidal anti-inflammatories (NSAID): Secondary | ICD-10-CM | POA: Insufficient documentation

## 2016-05-25 DIAGNOSIS — Z79899 Other long term (current) drug therapy: Secondary | ICD-10-CM | POA: Insufficient documentation

## 2016-05-25 DIAGNOSIS — G43809 Other migraine, not intractable, without status migrainosus: Secondary | ICD-10-CM | POA: Insufficient documentation

## 2016-05-25 DIAGNOSIS — Z792 Long term (current) use of antibiotics: Secondary | ICD-10-CM | POA: Insufficient documentation

## 2016-05-25 DIAGNOSIS — G43009 Migraine without aura, not intractable, without status migrainosus: Secondary | ICD-10-CM | POA: Insufficient documentation

## 2016-05-25 DIAGNOSIS — N12 Tubulo-interstitial nephritis, not specified as acute or chronic: Secondary | ICD-10-CM | POA: Insufficient documentation

## 2016-05-25 MED ORDER — KETOROLAC TROMETHAMINE 30 MG/ML IJ SOLN
30.0000 mg | Freq: Once | INTRAMUSCULAR | Status: AC
  Administered 2016-05-25: 30 mg via INTRAMUSCULAR
  Filled 2016-05-25: qty 1

## 2016-05-25 MED ORDER — METOCLOPRAMIDE HCL 5 MG/ML IJ SOLN
10.0000 mg | Freq: Once | INTRAMUSCULAR | Status: AC
  Administered 2016-05-26: 10 mg via INTRAVENOUS
  Filled 2016-05-25: qty 2

## 2016-05-25 MED ORDER — DIPHENHYDRAMINE HCL 50 MG/ML IJ SOLN
25.0000 mg | Freq: Once | INTRAMUSCULAR | Status: AC
  Administered 2016-05-26: 25 mg via INTRAVENOUS
  Filled 2016-05-25: qty 1

## 2016-05-25 MED ORDER — ACETAMINOPHEN 325 MG PO TABS
650.0000 mg | ORAL_TABLET | Freq: Once | ORAL | Status: AC
  Administered 2016-05-25: 650 mg via ORAL
  Filled 2016-05-25: qty 2

## 2016-05-25 MED ORDER — ONDANSETRON 4 MG PO TBDP
4.0000 mg | ORAL_TABLET | Freq: Once | ORAL | Status: AC
  Administered 2016-05-25: 4 mg via ORAL
  Filled 2016-05-25: qty 1

## 2016-05-25 MED ORDER — SODIUM CHLORIDE 0.9 % IV BOLUS (SEPSIS)
500.0000 mL | Freq: Once | INTRAVENOUS | Status: AC
  Administered 2016-05-26: 500 mL via INTRAVENOUS

## 2016-05-25 NOTE — ED Triage Notes (Signed)
Pt seen earlier today for migraine and given IM Toradol and zofran ODT and felt better when left but woke up tonight and stated it felt like she hadn't even gotten anything today. States no longer feels nauseated but feels dizzy and vision is blurry and having light sensitivity.

## 2016-05-25 NOTE — ED Provider Notes (Signed)
[facility] Emergency Department Provider Note   ____________________________________________   First MD Initiated Contact with Patient 05/25/16 2336     (approximate)  I have reviewed the triage vital signs and the nursing notes.   HISTORY  Chief Complaint Migraine    HPI Rebecca Lang is a 40 y.o. female who comes in today with a migraine. The patient was seen earlier this afternoon with a migraine. She was treated and felt improved. The patient went home and went to sleep but when she woke up her headache was worse. The patient did not take anything but came right back into the hospital. The patient did not check her temperature prior to coming in but she is febrile here in the ER. The patient reports that she's had this migraine on and off for the past 3 days. She also has some low back soreness. She reports that she sometimes gets back pain due to stress so she did not think much of it. The patient has had no pain with urination but did have some nausea earlier. She's had no vomiting no sore throat no neck pain no cough or runny nose. The patient rates her pain a 10 out of 10 in intensity and says that her headaches have never gotten this bad. The patient does have a history of migraines but reports that she has not had them very often in some time. The patient is here for evaluation today and treatment of the symptoms.The patient denies any neck pain.   Past Medical History:  Diagnosis Date  . Anemia   . Back pain     There are no active problems to display for this patient.   Past Surgical History:  Procedure Laterality Date  . ABDOMINAL SURGERY  2004   gastric bypass  . CESAREAN SECTION    . tubal rupture      Prior to Admission medications   Medication Sig Start Date End Date Taking? Authorizing Provider  cephALEXin (KEFLEX) 500 MG capsule Take 1 capsule (500 mg total) by mouth 4 (four) times daily. 11/07/15   Delorise RoyalsJonathan D Cuthriell, PA-C    cephALEXin (KEFLEX) 500 MG capsule Take 1 capsule (500 mg total) by mouth 4 (four) times daily. 05/26/16 06/05/16  Rebecka ApleyAllison P Jhania Etherington, MD  clindamycin (CLEOCIN) 300 MG capsule Take 1 capsule (300 mg total) by mouth 3 (three) times daily. 11/04/15   Rebecka ApleyAllison P Noa Constante, MD  diazepam (VALIUM) 5 MG tablet Take 1 tablet (5 mg total) by mouth every 8 (eight) hours as needed for muscle spasms. 10/12/15   Emily FilbertJonathan E Williams, MD  fluconazole (DIFLUCAN) 150 MG tablet Take 1 tablet (150 mg total) by mouth as directed. Take 1 tablet now and 1 tablet after finishing antibiotics. 11/07/15   Delorise RoyalsJonathan D Cuthriell, PA-C  fluconazole (DIFLUCAN) 150 MG tablet Take 1 tablet (150 mg total) by mouth once. 05/26/16 05/26/16  Rebecka ApleyAllison P Lilyahna Sirmon, MD  ibuprofen (ADVIL,MOTRIN) 800 MG tablet Take 1 tablet (800 mg total) by mouth every 8 (eight) hours as needed. 10/12/15   Emily FilbertJonathan E Williams, MD  triamcinolone cream (KENALOG) 0.1 % Apply 1 application topically 4 (four) times daily. 11/07/15   Delorise RoyalsJonathan D Cuthriell, PA-C    Allergies Clindamycin/lincomycin  History reviewed. No pertinent family history.  Social History Social History  Substance Use Topics  . Smoking status: Never Smoker  . Smokeless tobacco: Not on file  . Alcohol use No    Review of Systems Constitutional:  fever/chills Eyes: No  visual changes. ENT: No sore throat. Cardiovascular: Denies chest pain. Respiratory: Denies shortness of breath. Gastrointestinal: No abdominal pain.  No nausea, no vomiting.  No diarrhea.  No constipation. Genitourinary: Negative for dysuria. Musculoskeletal: back pain. Skin: Negative for rash. Neurological: Headache  10-point ROS otherwise negative.  ____________________________________________   PHYSICAL EXAM:  VITAL SIGNS: ED Triage Vitals  Enc Vitals Group     BP 05/25/16 2325 (!) 149/62     Pulse Rate 05/25/16 2325 89     Resp --      Temp 05/25/16 2325 (!) 101.4 F (38.6 C)     Temp Source 05/25/16  2325 Oral     SpO2 05/25/16 2325 98 %     Weight 05/25/16 2327 200 lb (90.7 kg)     Height 05/25/16 2327 5\' 3"  (1.6 m)     Head Circumference --      Peak Flow --      Pain Score 05/25/16 2327 10     Pain Loc --      Pain Edu? --      Excl. in GC? --     Constitutional: Alert and oriented. Well appearing and in Moderate distress. Ears: Right TM with defect from previous surgery, bilateral TM is with no erythema or bulging. Eyes: Conjunctivae are normal. PERRL. EOMI. Head: Atraumatic. Nose: No congestion/rhinnorhea. Mouth/Throat: Mucous membranes are moist.  Oropharynx non-erythematous. Neck:  supple with no meningismus Cardiovascular: Normal rate, regular rhythm. Grossly normal heart sounds.  Good peripheral circulation. Respiratory: Normal respiratory effort.  No retractions. Lungs CTAB. Gastrointestinal: Soft and nontender. No distention. Positive bowel sounds, no CVA tenderness to palpation Musculoskeletal: No lower extremity tenderness nor edema.   Neurologic:  Normal speech and language.  Skin:  Skin is warm, dry and intact.  Psychiatric: Mood and affect are normal.   ____________________________________________   LABS (all labs ordered are listed, but only abnormal results are displayed)  Labs Reviewed  CBC - Abnormal; Notable for the following:       Result Value   Hemoglobin 8.3 (*)    HCT 27.8 (*)    MCV 64.7 (*)    MCH 19.3 (*)    MCHC 29.8 (*)    RDW 18.2 (*)    All other components within normal limits  BASIC METABOLIC PANEL - Abnormal; Notable for the following:    Sodium 134 (*)    Potassium 3.3 (*)    Glucose, Bld 105 (*)    Calcium 8.7 (*)    All other components within normal limits  URINALYSIS COMPLETEWITH MICROSCOPIC ([facility] ONLY) - Abnormal; Notable for the following:    Color, Urine YELLOW (*)    APPearance HAZY (*)    Ketones, ur TRACE (*)    Hgb urine dipstick 2+ (*)    Protein, ur 30 (*)    Nitrite POSITIVE (*)    Leukocytes, UA TRACE (*)     Bacteria, UA MANY (*)    Squamous Epithelial / LPF 0-5 (*)    All other components within normal limits  INFLUENZA PANEL BY PCR (TYPE A & B, H1N1)   ____________________________________________  EKG  none ____________________________________________  RADIOLOGY  CXR ____________________________________________   PROCEDURES  Procedure(s) performed: None  Procedures  Critical Care performed: No  ____________________________________________   INITIAL IMPRESSION / ASSESSMENT AND PLAN / ED COURSE  Pertinent labs & imaging results that were available during my care of the patient were reviewed by me and considered in my medical decision making (see  chart for details).  This is a 40 year old female who comes into the hospital today with a headache. The patient was seen earlier for migraine and treated but has returned with a temperature to 101.4 and a return of her headache. I feel that the patient's headache is related to her fever. I will check some blood work to include a CBC, urinalysis and a BMP to determine a cause of the patient's fever. I will also check a urinalysis, a flu and chest x-ray. I will give the patient a dose of Reglan, Benadryl, Tylenol and some normal saline. I will reassess the patient once I received the results of her blood work as well as her imaging and she's receive her medications.  Clinical Course    Appears that the patient has urinary tract infection. I will give the patient a dose of ceftriaxone. After the medications the patient reports that she feels improved. Her temperature is also improved. The patient will be discharged home and she has not vomiting and able to take by mouth. She also has a normal white blood cell count. She should return with any worsening symptoms vomiting or inability to keep things down. The patient has no further complaints or concerns she'll be discharged home.  ____________________________________________   FINAL  CLINICAL IMPRESSION(S) / ED DIAGNOSES  Final diagnoses:  Other migraine without status migrainosus, not intractable  Pyelonephritis      NEW MEDICATIONS STARTED DURING THIS VISIT:  New Prescriptions   CEPHALEXIN (KEFLEX) 500 MG CAPSULE    Take 1 capsule (500 mg total) by mouth 4 (four) times daily.   FLUCONAZOLE (DIFLUCAN) 150 MG TABLET    Take 1 tablet (150 mg total) by mouth once.     Note:  This document was prepared using Dragon voice recognition software and may include unintentional dictation errors.    Rebecka Apley, MD 05/26/16 [phone]

## 2016-05-25 NOTE — ED Notes (Signed)
NAD noted at time of D/C. Pt denies questions or concerns. Pt ambulatory to the lobby at this time.  

## 2016-05-25 NOTE — ED Triage Notes (Signed)
Patient from home via POV with complaint of possible migraine. Patient has taken motrin at home with temporary partial relief, however the headache comes back stronger. +nausea without vomiting. +blurred vision Sensitivity to sound and light.

## 2016-05-25 NOTE — ED Provider Notes (Signed)
[facility] Emergency Department Provider Note  ____________________________________________  Time seen: Approximately 12:02 PM  I have reviewed the triage vital signs and the nursing notes.   HISTORY  Chief Complaint Migraine    HPI Rebecca Lang is a 40 y.o. female , NAD, presents to the emergency department with three-day history of migraine headache. Patient states she has history of migraines over the course of her life time. Has had 2 other migraines in the last couple of years. States that this headache is with similar pattern and pain as previous migraines. Headache began about her temples and behind her eyes and has gradually worsened over time. Has been taking over-the-counter medications with the help for short period of time but insight having a rebound headache worse than what she had previously. Denies thunderclap onset nor is this is the worst headache of her life. Headache has not woken her from sleep. Has had some photophobia and mild blurred vision as well as some tingling in her fingers which is consistent with previous migraine headaches. Denies any focal numbness, weakness or tingling. Has had no changes in her speech, gait or mentation. Denies any head injuries, falls or traumas. No neck or back pain. Has felt nauseous but has not experienced any emesis. Denies any abdominal pain, chest pain, shortness of breath. Has had no upper respiratory symptoms or sinus congestion. Denies fever, chills, fatigue   Past Medical History:  Diagnosis Date  . Anemia   . Back pain     There are no active problems to display for this patient.   Past Surgical History:  Procedure Laterality Date  . ABDOMINAL SURGERY  2004   gastric bypass  . CESAREAN SECTION    . tubal rupture      Prior to Admission medications   Medication Sig Start Date End Date Taking? Authorizing Provider  cephALEXin (KEFLEX) 500 MG capsule Take 1 capsule (500 mg total) by mouth  4 (four) times daily. 11/07/15   Delorise RoyalsJonathan D Cuthriell, PA-C  clindamycin (CLEOCIN) 300 MG capsule Take 1 capsule (300 mg total) by mouth 3 (three) times daily. 11/04/15   Rebecka ApleyAllison P Webster, MD  diazepam (VALIUM) 5 MG tablet Take 1 tablet (5 mg total) by mouth every 8 (eight) hours as needed for muscle spasms. 10/12/15   Emily FilbertJonathan E Williams, MD  fluconazole (DIFLUCAN) 150 MG tablet Take 1 tablet (150 mg total) by mouth as directed. Take 1 tablet now and 1 tablet after finishing antibiotics. 11/07/15   Delorise RoyalsJonathan D Cuthriell, PA-C  ibuprofen (ADVIL,MOTRIN) 800 MG tablet Take 1 tablet (800 mg total) by mouth every 8 (eight) hours as needed. 10/12/15   Emily FilbertJonathan E Williams, MD  triamcinolone cream (KENALOG) 0.1 % Apply 1 application topically 4 (four) times daily. 11/07/15   Delorise RoyalsJonathan D Cuthriell, PA-C    Allergies Clindamycin/lincomycin  History reviewed. No pertinent family history.  Social History Social History  Substance Use Topics  . Smoking status: Never Smoker  . Smokeless tobacco: Not on file  . Alcohol use No     Review of Systems  Constitutional: No fever/chills, fatigue Eyes: Positive blurred vision. No visual loss or floaters.  ENT: No sore throat or ear pain, nasal congestion, runny nose, sinus pressure. Cardiovascular: No chest pain. Respiratory: No shortness of breath. No wheezing.  Gastrointestinal: Positive nausea no vomiting. No abdominal pain.    Musculoskeletal: Negative for back pain.  Skin: Negative for rash. Neurological: Positive for headaches, but no focal weakness or numbness. Positive  tingling in fingers that is intermittent. No LOC, dizziness. 10-point ROS otherwise negative.  ____________________________________________   PHYSICAL EXAM:  VITAL SIGNS: ED Triage Vitals  Enc Vitals Group     BP 05/25/16 1105 138/77     Pulse Rate 05/25/16 1105 80     Resp --      Temp 05/25/16 1105 99.1 F (37.3 C)     Temp Source 05/25/16 1105 Oral     SpO2 05/25/16 1105  99 %     Weight 05/25/16 1108 200 lb (90.7 kg)     Height 05/25/16 1108 5\' 3"  (1.6 m)     Head Circumference --      Peak Flow --      Pain Score 05/25/16 1110 10     Pain Loc --      Pain Edu? --      Excl. in GC? --      Constitutional: Alert and oriented. Well appearing and in no acute distress. Eyes: Conjunctivae are normal without icterus or injection. PERRLA. EOMI without pain.  Head: Atraumatic. Neck: Supple with full range of motion. No meningismus. Hematological/Lymphatic/Immunilogical: No cervical lymphadenopathy. Cardiovascular: Normal rate, regular rhythm. Normal S1 and S2.  Good peripheral circulation. Respiratory: Normal respiratory effort without tachypnea or retractions. Lungs CTAB with breath sounds noted in all lung fields. No wheeze, rhonchi, rales Musculoskeletal: Full range of motion of bilateral upper and lower extremities without pain or difficulty. Neurologic:  Normal speech and language. Normal gait. No gross focal neurologic deficits are appreciated. Cranial nerves III through XII grossly intact. Skin:  Skin is warm, dry and intact. No rash noted. Psychiatric: Mood and affect are normal. Speech and behavior are normal. Patient exhibits appropriate insight and judgement.   ____________________________________________   LABS  None ____________________________________________  EKG  None ____________________________________________  RADIOLOGY  None ____________________________________________    PROCEDURES  Procedure(s) performed: None   Procedures   Medications  ketorolac (TORADOL) 30 MG/ML injection 30 mg (30 mg Intramuscular Given 05/25/16 1213)  ondansetron (ZOFRAN-ODT) disintegrating tablet 4 mg (4 mg Oral Given 05/25/16 1213)     ____________________________________________   INITIAL IMPRESSION / ASSESSMENT AND PLAN / ED COURSE  Pertinent labs & imaging results that were available during my care of the patient were reviewed by  me and considered in my medical decision making (see chart for details).  Clinical Course as of May 25 1254  Wynelle LinkSun May 25, 2016  1254 Patient was sleeping when I entered the room. She was easily awoken and states that her headache and nausea had significantly improved since being given Toradol and Zofran.  [JH]    Clinical Course User Index [JH] Kyrra Prada L Raynah Gomes, PA-C    Patient's diagnosis is consistent with Migraine without aura and without status migrainosus that is not intractable. Patient will be discharged home with instructions to follow up with her primary care provider tomorrow if headache persists.  Patient is given ED precautions to return to the ED for any worsening or new symptoms.    ____________________________________________  FINAL CLINICAL IMPRESSION(S) / ED DIAGNOSES  Final diagnoses:  Migraine without aura and without status migrainosus, not intractable      NEW MEDICATIONS STARTED DURING THIS VISIT:  New Prescriptions   No medications on file         Hope PigeonJami L Georgi Tuel, PA-C 05/25/16 1256    Jeanmarie PlantJames A McShane, MD 05/25/16 1358

## 2016-05-26 ENCOUNTER — Emergency Department: Payer: Self-pay

## 2016-05-26 LAB — URINALYSIS COMPLETE WITH MICROSCOPIC (ARMC ONLY)
Bilirubin Urine: NEGATIVE
GLUCOSE, UA: NEGATIVE mg/dL
Nitrite: POSITIVE — AB
PROTEIN: 30 mg/dL — AB
Specific Gravity, Urine: 1.021 (ref 1.005–1.030)
pH: 5 (ref 5.0–8.0)

## 2016-05-26 LAB — INFLUENZA PANEL BY PCR (TYPE A & B)
INFLBPCR: NEGATIVE
Influenza A By PCR: NEGATIVE

## 2016-05-26 LAB — CBC
HEMATOCRIT: 27.8 % — AB (ref 35.0–47.0)
Hemoglobin: 8.3 g/dL — ABNORMAL LOW (ref 12.0–16.0)
MCH: 19.3 pg — AB (ref 26.0–34.0)
MCHC: 29.8 g/dL — AB (ref 32.0–36.0)
MCV: 64.7 fL — AB (ref 80.0–100.0)
Platelets: 398 10*3/uL (ref 150–440)
RBC: 4.29 MIL/uL (ref 3.80–5.20)
RDW: 18.2 % — AB (ref 11.5–14.5)
WBC: 10.7 10*3/uL (ref 3.6–11.0)

## 2016-05-26 LAB — BASIC METABOLIC PANEL
Anion gap: 6 (ref 5–15)
BUN: 8 mg/dL (ref 6–20)
CALCIUM: 8.7 mg/dL — AB (ref 8.9–10.3)
CO2: 25 mmol/L (ref 22–32)
CREATININE: 0.66 mg/dL (ref 0.44–1.00)
Chloride: 103 mmol/L (ref 101–111)
GFR calc Af Amer: >60 mL/min (ref >60)
GFR calc non Af Amer: >60 mL/min (ref >60)
GLUCOSE: 105 mg/dL — AB (ref 65–99)
Potassium: 3.3 mmol/L — ABNORMAL LOW (ref 3.5–5.1)
Sodium: 134 mmol/L — ABNORMAL LOW (ref 135–145)

## 2016-05-26 MED ORDER — CEFTRIAXONE SODIUM-DEXTROSE 1-3.74 GM-% IV SOLR
1.0000 g | Freq: Once | INTRAVENOUS | Status: AC
  Administered 2016-05-26: 1 g via INTRAVENOUS
  Filled 2016-05-26: qty 50

## 2016-05-26 MED ORDER — CEPHALEXIN 500 MG PO CAPS: 500.0000 mg | ORAL_CAPSULE | Freq: Four times a day (QID) | ORAL | 0 refills | Status: AC

## 2016-05-26 MED ORDER — FLUCONAZOLE 150 MG PO TABS: 150.0000 mg | ORAL_TABLET | Freq: Once | ORAL | 0 refills | Status: AC

## 2016-05-26 MED ORDER — DEXTROSE 5 % IV SOLN: 1.0000 g | Freq: Once | INTRAVENOUS | Status: DC

## 2016-05-28 LAB — URINE CULTURE: Culture: >=100000 — AB

## 2016-08-16 DIAGNOSIS — Z7722 Contact with and (suspected) exposure to environmental tobacco smoke (acute) (chronic): Secondary | ICD-10-CM | POA: Insufficient documentation

## 2016-08-16 DIAGNOSIS — R1012 Left upper quadrant pain: Secondary | ICD-10-CM | POA: Insufficient documentation

## 2016-08-16 DIAGNOSIS — R1013 Epigastric pain: Secondary | ICD-10-CM | POA: Insufficient documentation

## 2016-08-16 NOTE — ED Notes (Signed)
Reports symptoms for the past 2 weeks.  Reports pain under left breast area that occasionally will radiated across to right side and through to back.

## 2016-08-17 ENCOUNTER — Emergency Department: Payer: Self-pay

## 2016-08-17 ENCOUNTER — Encounter: Payer: Self-pay | Admitting: Emergency Medicine

## 2016-08-17 ENCOUNTER — Emergency Department
Admission: EM | Admit: 2016-08-17 | Discharge: 2016-08-17 | Disposition: A | Discharge status: Home / Self Care | Payer: Self-pay | Attending: Student in an Organized Health Care Education/Training Program | Admitting: Student in an Organized Health Care Education/Training Program

## 2016-08-17 DIAGNOSIS — R1013 Epigastric pain: Secondary | ICD-10-CM

## 2016-08-17 HISTORY — DX: Other hypertrophic cardiomyopathy: I42.2

## 2016-08-17 LAB — COMPREHENSIVE METABOLIC PANEL
ALBUMIN: 3.6 g/dL (ref 3.5–5.0)
ALT: 16 U/L (ref 14–54)
AST: 22 U/L (ref 15–41)
Alkaline Phosphatase: 96 U/L (ref 38–126)
Anion gap: 8 (ref 5–15)
BILIRUBIN TOTAL: 0.4 mg/dL (ref 0.3–1.2)
BUN: 18 mg/dL (ref 6–20)
CALCIUM: 8.7 mg/dL — AB (ref 8.9–10.3)
CO2: 22 mmol/L (ref 22–32)
Chloride: 106 mmol/L (ref 101–111)
Creatinine, Ser: 0.83 mg/dL (ref 0.44–1.00)
GFR calc Af Amer: >60 mL/min (ref >60)
GLUCOSE: 103 mg/dL — AB (ref 65–99)
Potassium: 3.4 mmol/L — ABNORMAL LOW (ref 3.5–5.1)
Sodium: 136 mmol/L (ref 135–145)
TOTAL PROTEIN: 7.1 g/dL (ref 6.5–8.1)

## 2016-08-17 LAB — TROPONIN I: Troponin I: <0.03 ng/mL (ref <0.03)

## 2016-08-17 LAB — CBC
HEMATOCRIT: 26 % — AB (ref 35.0–47.0)
Hemoglobin: 7.9 g/dL — ABNORMAL LOW (ref 12.0–16.0)
MCH: 19.7 pg — AB (ref 26.0–34.0)
MCHC: 30.4 g/dL — ABNORMAL LOW (ref 32.0–36.0)
MCV: 64.8 fL — AB (ref 80.0–100.0)
Platelets: 370 10*3/uL (ref 150–440)
RBC: 4.02 MIL/uL (ref 3.80–5.20)
RDW: 17.8 % — AB (ref 11.5–14.5)
WBC: 8.5 10*3/uL (ref 3.6–11.0)

## 2016-08-17 MED ORDER — PROMETHAZINE HCL 25 MG PO TABS
12.5000 mg | ORAL_TABLET | Freq: Once | ORAL | Status: AC
  Administered 2016-08-17: 12.5 mg via ORAL
  Filled 2016-08-17: qty 1

## 2016-08-17 MED ORDER — PROMETHAZINE HCL 12.5 MG PO TABS: 12.5000 mg | ORAL_TABLET | Freq: Four times a day (QID) | ORAL | 0 refills | Status: DC | PRN

## 2016-08-17 MED ORDER — DICYCLOMINE HCL 10 MG PO CAPS: 10.0000 mg | ORAL_CAPSULE | Freq: Three times a day (TID) | ORAL | 0 refills | Status: DC | PRN

## 2016-08-17 MED ORDER — DICYCLOMINE HCL 10 MG PO CAPS
10.0000 mg | ORAL_CAPSULE | Freq: Once | ORAL | Status: AC
  Administered 2016-08-17: 10 mg via ORAL
  Filled 2016-08-17: qty 1

## 2016-08-17 MED ORDER — IRON 325 (65 FE) MG PO TABS: 1.0000 | ORAL_TABLET | Freq: Three times a day (TID) | ORAL | 0 refills | Status: DC

## 2016-08-17 MED ORDER — POLYETHYLENE GLYCOL 3350 17 G PO PACK: 17.0000 g | PACK | Freq: Every day | ORAL | 0 refills | Status: DC

## 2016-08-17 NOTE — ED Provider Notes (Signed)
[facility] Emergency Department Provider Note    First MD Initiated Contact with Patient 08/17/16 [phone]     (approximate)  I have reviewed the triage vital signs and the nursing notes.   HISTORY  Chief Complaint Chest Pain    HPI Rebecca Lang is a 41 y.o. female presents with chief complaint of 2 weeks of epigastric and left upper quadrant pain as well as discomfort under her left breast. States it is worsened after eating. No nausea or vomiting. Denies any shortness of breath or fever. States that initially started last time it has migrated to the right side. States she has multiple family members with a history of cholelithiasis and therefore she is concerned this is related to her gallbladder.   Past Medical History:  Diagnosis Date  . Anemia   . Back pain   . Hypertrophic cardiomyopathy (HCC)    No family history on file. Past Surgical History:  Procedure Laterality Date  . ABDOMINAL SURGERY  2004   gastric bypass  . CESAREAN SECTION    . tubal rupture     There are no active problems to display for this patient.     Prior to Admission medications   Not on File    Allergies Clindamycin/lincomycin    Social History Social History  Substance Use Topics  . Smoking status: Passive Smoke Exposure - Never Smoker  . Smokeless tobacco: Never Used  . Alcohol use No    Review of Systems Patient denies headaches, rhinorrhea, blurry vision, numbness, shortness of breath, chest pain, edema, cough, abdominal pain, nausea, vomiting, diarrhea, dysuria, fevers, rashes or hallucinations unless otherwise stated above in HPI. ____________________________________________   PHYSICAL EXAM:  VITAL SIGNS: Vitals:   08/17/16 0006  BP: (!) 146/79  Pulse: 62  Resp: 16  Temp: 98.3 F (36.8 C)    Constitutional: Alert and oriented. Well appearing and in no acute distress. Eyes: Conjunctivae are normal. PERRL. EOMI. Head:  Atraumatic. Nose: No congestion/rhinnorhea. Mouth/Throat: Mucous membranes are moist.  Oropharynx non-erythematous. Neck: No stridor. Painless ROM. No cervical spine tenderness to palpation Hematological/Lymphatic/Immunilogical: No cervical lymphadenopathy. Cardiovascular: Normal rate, regular rhythm. Grossly normal heart sounds.  Good peripheral circulation. Respiratory: Normal respiratory effort.  No retractions. Lungs CTAB. Gastrointestinal: Soft and nontender. No distention. No abdominal bruits. No CVA tenderness. Genitourinary:  Musculoskeletal: No lower extremity tenderness nor edema.  No joint effusions. Neurologic:  Normal speech and language. No gross focal neurologic deficits are appreciated. No gait instability. Skin:  Skin is warm, dry and intact. No rash noted. Psychiatric: Mood and affect are normal. Speech and behavior are normal.  ____________________________________________   LABS (all labs ordered are listed, but only abnormal results are displayed)  Results for orders placed or performed during the hospital encounter of 08/17/16 (from the past 24 hour(s))  CBC     Status: Abnormal   Collection Time: 08/17/16 12:21 AM  Result Value Ref Range   WBC 8.5 3.6 - 11.0 K/uL   RBC 4.02 3.80 - 5.20 MIL/uL   Hemoglobin 7.9 (L) 12.0 - 16.0 g/dL   HCT 96.0 (L) 45.4 - 09.8 %   MCV 64.8 (L) 80.0 - 100.0 fL   MCH 19.7 (L) 26.0 - 34.0 pg   MCHC 30.4 (L) 32.0 - 36.0 g/dL   RDW 11.9 (H) 14.7 - 82.9 %   Platelets 370 150 - 440 K/uL  Troponin I     Status: None   Collection Time: 08/17/16 12:21 AM  Result Value Ref Range   Troponin I <0.03 <0.03 ng/mL  Comprehensive metabolic panel     Status: Abnormal   Collection Time: 08/17/16 12:21 AM  Result Value Ref Range   Sodium 136 135 - 145 mmol/L   Potassium 3.4 (L) 3.5 - 5.1 mmol/L   Chloride 106 101 - 111 mmol/L   CO2 22 22 - 32 mmol/L   Glucose, Bld 103 (H) 65 - 99 mg/dL   BUN 18 6 - 20 mg/dL   Creatinine, Ser 0.980.83 0.44 -  1.00 mg/dL   Calcium 8.7 (L) 8.9 - 10.3 mg/dL   Total Protein 7.1 6.5 - 8.1 g/dL   Albumin 3.6 3.5 - 5.0 g/dL   AST 22 15 - 41 U/L   ALT 16 14 - 54 U/L   Alkaline Phosphatase 96 38 - 126 U/L   Total Bilirubin 0.4 0.3 - 1.2 mg/dL   GFR calc non Af Amer >60 >60 mL/min   GFR calc Af Amer >60 >60 mL/min   Anion gap 8 5 - 15   ____________________________________________  EKG My review and personal interpretation at Time: 0:19   Indication: chest pain  Rate: 60  Rhythm: sinus Axis: normal Other: no acute ischemia, normal intervals ____________________________________________  RADIOLOGY  I personally reviewed all radiographic images ordered to evaluate for the above acute complaints and reviewed radiology reports and findings.  These findings were personally discussed with the patient.  Please see medical record for radiology report.  ____________________________________________   PROCEDURES  Procedure(s) performed:  Procedures    Critical Care performed: no ____________________________________________   INITIAL IMPRESSION / ASSESSMENT AND PLAN / ED COURSE  Pertinent labs & imaging results that were available during my care of the patient were reviewed by me and considered in my medical decision making (see chart for details).  DDX: Cholelithiasis, gastritis, enteritis, pneumonia, ACS. pe  Thompson Caulabatha L Sarver is a 41 y.o. who presents to the ED with for quadrant pain as described above.  Patient is AFVSS in ED. Exam as above. Given current presentation have considered the above differential.  EKG shows no evidence of acute ischemia and troponin is negative. Do not feel is clinically consistent with ACS is been ongoing for 2 weeks. Patient is low risk by well's and she has  PERC negative.  Chest x-ray shows no evidence of pneumonia. Her abdominal exam is soft and benign. Throat is otherwise reassuring. Due to her complaint of concern for cholelithiasis with discomfort after eating  and significant a history of the same will order a right upper quadrant ultrasound to further evaluate.  Will provide symptomatic management.  Clinical Course as of Aug 17 620  Wynelle LinkSun Aug 17, 2016  [id number] RUQ US reassuring.  Discussed results with patient.  Recommended follow up with PCP.  Patient was able to tolerate PO and was able to ambulate with a steady gait.  Have discussed with the patient and available family all diagnostics and treatments performed thus far and all questions were answered to the best of my ability. The patient demonstrates understanding and agreement with plan.     [PR]    Clinical Course User Index [PR] Willy EddyPatrick Arlynn Stare, MD     ____________________________________________   FINAL CLINICAL IMPRESSION(S) / ED DIAGNOSES  Final diagnoses:  Acute epigastric pain      NEW MEDICATIONS STARTED DURING THIS VISIT:  New Prescriptions   No medications on file     Note:  This document was prepared using Dragon voice recognition  software and may include unintentional dictation errors.    Willy Eddy, MD 08/17/16 [phone]

## 2016-08-17 NOTE — ED Notes (Signed)
Pt. States lt. Chest pain that radiates down left side to abdominal area.  Pt. States pain has been going on for the past 2 weeks.  Pt. Appears to be in no acute distress.

## 2016-08-17 NOTE — ED Triage Notes (Signed)
Pt states left lower chest pain that radiates to right lower chest and bilateral upper abd quadrants. Pt states she had had nausea. Pt states area is tender to touch and pt does complain of dizziness. Pt denies shob, fever. Pt appears in no acute distress in triage. Pt states pain began two weeks pta.

## 2016-08-17 NOTE — ED Notes (Signed)
Pt. Going home with friend 

## 2016-08-17 NOTE — Discharge Instructions (Signed)

## 2016-08-17 NOTE — ED Notes (Signed)
Patient transported to Ultrasound 

## 2017-03-30 ENCOUNTER — Encounter: Payer: Self-pay | Admitting: Emergency Medicine

## 2017-03-30 ENCOUNTER — Emergency Department
Admission: EM | Admit: 2017-03-30 | Discharge: 2017-03-30 | Disposition: A | Discharge status: Home / Self Care | Payer: Self-pay | Attending: Emergency Medicine | Admitting: Emergency Medicine

## 2017-03-30 DIAGNOSIS — H60331 Swimmer's ear, right ear: Secondary | ICD-10-CM | POA: Insufficient documentation

## 2017-03-30 DIAGNOSIS — Z79899 Other long term (current) drug therapy: Secondary | ICD-10-CM | POA: Insufficient documentation

## 2017-03-30 DIAGNOSIS — Z7722 Contact with and (suspected) exposure to environmental tobacco smoke (acute) (chronic): Secondary | ICD-10-CM | POA: Insufficient documentation

## 2017-03-30 MED ORDER — CIPROFLOXACIN-DEXAMETHASONE 0.3-0.1 % OT SUSP: 4.0000 [drp] | Freq: Two times a day (BID) | OTIC | 0 refills | Status: AC

## 2017-03-30 NOTE — ED Notes (Signed)
Pt reports she is having right ear pain that radiates into right side of face - pt states the pain is on the outside of her ear

## 2017-03-30 NOTE — ED Provider Notes (Signed)
[facility] Emergency Department Provider Note  ____________________________________________  Time seen: Approximately 10:38 PM  I have reviewed the triage vital signs and the nursing notes.   HISTORY  Chief Complaint Otalgia    HPI Rebecca Lang is a 41 y.o. female presenting to the emergency department with 10/10 right otalgia for the past 2 days. Patient states the pain is worsened with palpation of the tragus. She denies fever and chills. States that pain radiates along the right temple. No alleviating measures have been attempted.   Past Medical History:  Diagnosis Date  . Anemia   . Back pain   . Hypertrophic cardiomyopathy (HCC)     There are no active problems to display for this patient.   Past Surgical History:  Procedure Laterality Date  . ABDOMINAL SURGERY  2004   gastric bypass  . CESAREAN SECTION    . tubal rupture      Prior to Admission medications   Medication Sig Start Date End Date Taking? Authorizing Provider  ciprofloxacin-dexamethasone (CIPRODEX) OTIC suspension Place 4 drops into the right ear 2 (two) times daily. 03/30/17 04/06/17  Orvil Feil, PA-C  dicyclomine (BENTYL) 10 MG capsule Take 1 capsule (10 mg total) by mouth 3 (three) times daily as needed for spasms. 08/17/16 08/31/16  Willy Eddy, MD  Ferrous Sulfate (IRON) 325 (65 Fe) MG TABS Take 1 tablet by mouth 3 (three) times daily. Take every other day 08/17/16   Willy Eddy, MD  polyethylene glycol [facility] / Ethelene Hal) packet Take 17 g by mouth daily. Mix one tablespoon with 8oz of your favorite juice or water every day until you are having soft formed stools. Then start taking once daily if you didn't have a stool the day before. 08/17/16   Willy Eddy, MD  promethazine (PHENERGAN) 12.5 MG tablet Take 1 tablet (12.5 mg total) by mouth every 6 (six) hours as needed for nausea or vomiting. 08/17/16   Willy Eddy, MD     Allergies Clindamycin/lincomycin  No family history on file.  Social History Social History  Substance Use Topics  . Smoking status: Passive Smoke Exposure - Never Smoker  . Smokeless tobacco: Never Used  . Alcohol use No     Review of Systems  Constitutional: No fever/chills Eyes: No visual changes. No discharge ENT: Patient has right otalgia.  Cardiovascular: no chest pain. Respiratory: no cough. No SOB. Skin: Negative for rash, abrasions, lacerations, ecchymosis.   ____________________________________________   PHYSICAL EXAM:  VITAL SIGNS: ED Triage Vitals  Enc Vitals Group     BP 03/30/17 1954 125/66     Pulse Rate 03/30/17 1954 69     Resp 03/30/17 1954 20     Temp 03/30/17 1954 97.8 F (36.6 C)     Temp Source 03/30/17 1954 Oral     SpO2 03/30/17 1954 100 %     Weight 03/30/17 1955 200 lb (90.7 kg)     Height 03/30/17 1955  (1.6 m)     Head Circumference --      Peak Flow --      Pain Score 03/30/17 1953 10     Pain Loc --      Pain Edu? --      Excl. in GC? --      Constitutional: Alert and oriented. Well appearing and in no acute distress. Eyes: Conjunctivae are normal. PERRL. EOMI. Head: Atraumatic. ENT:      Ears: Patient has reproducible pain to palpation along the  right tragus. Right external auditory canal is mildly edematous. Right tympanic membrane is pearly.      Nose: No congestion/rhinnorhea.      Mouth/Throat: Mucous membranes are moist.  Cardiovascular: Normal rate, regular rhythm. Normal S1 and S2.  Good peripheral circulation. Respiratory: Normal respiratory effort without tachypnea or retractions. Lungs CTAB. Good air entry to the bases with no decreased or absent breath sounds. Skin:  Skin is warm, dry and intact. No rash noted. Psychiatric: Mood and affect are normal. Speech and behavior are normal. Patient exhibits appropriate insight and judgement.   ____________________________________________   LABS (all labs  ordered are listed, but only abnormal results are displayed)  Labs Reviewed - No data to display ____________________________________________  EKG   ____________________________________________  RADIOLOGY   No results found.  ____________________________________________    PROCEDURES  Procedure(s) performed:    Procedures    Medications - No data to display   ____________________________________________   INITIAL IMPRESSION / ASSESSMENT AND PLAN / ED COURSE  Pertinent labs & imaging results that were available during my care of the patient were reviewed by me and considered in my medical decision making (see chart for details).  Review of the  [place] was performed in accordance of the [place] prior to dispensing any controlled drugs.    Assessment and plan Otitis externa Patient presents to the emergency department with right otalgia reproduced with palpation of the tragus for the past 2 days. History and physical exam findings are consistent with otitis externa. Patient was discharged with Ciprodex. Patient was advised to follow-up with primary care as needed. All patient questions were answered.    ____________________________________________  FINAL CLINICAL IMPRESSION(S) / ED DIAGNOSES  Final diagnoses:  Acute swimmer's ear of right side      NEW MEDICATIONS STARTED DURING THIS VISIT:  New Prescriptions   CIPROFLOXACIN-DEXAMETHASONE (CIPRODEX) OTIC SUSPENSION    Place 4 drops into the right ear 2 (two) times daily.        This chart was dictated using voice recognition software/Dragon. Despite best efforts to proofread, errors can occur which can change the meaning. Any change was purely unintentional.    Orvil Feil, PA-C 03/31/17 0014    Nita Sickle, MD 04/01/17 2231

## 2017-03-30 NOTE — ED Notes (Signed)

## 2017-03-30 NOTE — ED Triage Notes (Addendum)
Patient to ER for c/o pain to right ear and area around outer ear.

## 2017-06-06 ENCOUNTER — Emergency Department: Payer: Self-pay

## 2017-06-06 ENCOUNTER — Other Ambulatory Visit: Payer: Self-pay

## 2017-06-06 DIAGNOSIS — Z7722 Contact with and (suspected) exposure to environmental tobacco smoke (acute) (chronic): Secondary | ICD-10-CM | POA: Insufficient documentation

## 2017-06-06 DIAGNOSIS — O200 Threatened abortion: Secondary | ICD-10-CM | POA: Insufficient documentation

## 2017-06-06 DIAGNOSIS — Z79899 Other long term (current) drug therapy: Secondary | ICD-10-CM | POA: Insufficient documentation

## 2017-06-06 DIAGNOSIS — O4691 Antepartum hemorrhage, unspecified, first trimester: Secondary | ICD-10-CM | POA: Insufficient documentation

## 2017-06-06 DIAGNOSIS — D649 Anemia, unspecified: Secondary | ICD-10-CM | POA: Insufficient documentation

## 2017-06-06 DIAGNOSIS — Z3A01 Less than 8 weeks gestation of pregnancy: Secondary | ICD-10-CM | POA: Insufficient documentation

## 2017-06-06 LAB — HCG, QUANTITATIVE, PREGNANCY: HCG, BETA CHAIN, QUANT, S: 422 m[IU]/mL — AB (ref <5)

## 2017-06-06 LAB — ABO/RH: ABO/RH(D): O POS

## 2017-06-06 NOTE — ED Triage Notes (Signed)
First nurse note: patient states that she is [redacted] weeks pregnant and started having vaginal bleeding this morning after intercourse. Patient states that the bleeding had stopped but when she went to the restroom this evening she noticed some blood on the tissue. Patient denies abdominal pain at this time.

## 2017-06-06 NOTE — ED Triage Notes (Signed)
Pt presents to ED via POV from home with c/o vaginal bleeding. Pt reports some abdominal pain yesterday that subsided; no cramping or abdominal pain today. Pt reports scant amount of pinkish bleeding noted only when wiping and not to the point of needing feminine pads. Pt is [redacted] weeks pregnant. Pt is A&O, in NAD; RR even, regular, and unlabored; skin color/temp is WNL.

## 2017-06-06 NOTE — ED Notes (Signed)
Patient transported to Ultrasound 

## 2017-06-07 ENCOUNTER — Emergency Department
Admission: EM | Admit: 2017-06-07 | Discharge: 2017-06-07 | Disposition: A | Discharge status: Home / Self Care | Payer: Self-pay | Attending: Emergency Medicine | Admitting: Emergency Medicine

## 2017-06-07 DIAGNOSIS — O208 Other hemorrhage in early pregnancy: Secondary | ICD-10-CM

## 2017-06-07 DIAGNOSIS — O4690 Antepartum hemorrhage, unspecified, unspecified trimester: Secondary | ICD-10-CM

## 2017-06-07 DIAGNOSIS — D649 Anemia, unspecified: Secondary | ICD-10-CM

## 2017-06-07 DIAGNOSIS — O209 Hemorrhage in early pregnancy, unspecified: Secondary | ICD-10-CM

## 2017-06-07 DIAGNOSIS — O200 Threatened abortion: Secondary | ICD-10-CM

## 2017-06-07 LAB — URINALYSIS, COMPLETE (UACMP) WITH MICROSCOPIC
Bacteria, UA: NONE SEEN
Bilirubin Urine: NEGATIVE
GLUCOSE, UA: NEGATIVE mg/dL
Hgb urine dipstick: NEGATIVE
Ketones, ur: NEGATIVE mg/dL
Leukocytes, UA: NEGATIVE
Nitrite: NEGATIVE
PH: 5 (ref 5.0–8.0)
Protein, ur: NEGATIVE mg/dL
Specific Gravity, Urine: 1.027 (ref 1.005–1.030)

## 2017-06-07 LAB — CBC
HCT: 28 % — ABNORMAL LOW (ref 35.0–47.0)
Hemoglobin: 8.4 g/dL — ABNORMAL LOW (ref 12.0–16.0)
MCH: 19.2 pg — AB (ref 26.0–34.0)
MCHC: 29.9 g/dL — AB (ref 32.0–36.0)
MCV: 64.1 fL — ABNORMAL LOW (ref 80.0–100.0)
PLATELETS: 462 10*3/uL — AB (ref 150–440)
RBC: 4.36 MIL/uL (ref 3.80–5.20)
RDW: 18.3 % — AB (ref 11.5–14.5)
WBC: 7.8 10*3/uL (ref 3.6–11.0)

## 2017-06-07 NOTE — ED Provider Notes (Signed)
[facility] Emergency Department Provider Note   ____________________________________________   First MD Initiated Contact with Patient 06/07/17 0115     (approximate)  I have reviewed the triage vital signs and the nursing notes.   HISTORY  Chief Complaint Vaginal Bleeding    HPI Rebecca Lang is a 41 y.o. female who presents to the ED from home with a chief complaint of vaginal bleeding.  Patient is  G6P3 Ab2 approximately [redacted] weeks pregnant by dates.  Had some pelvic cramping yesterday.  Began to spot lightly after sexual intercourse today.  Has not required use of maxi pad.  Denies associated fever, chills, chest pain, shortness of breath, diarrhea.  States she "always has UTIs".  Denies recent travel or trauma.  No use of anticoagulants.   Past Medical History:  Diagnosis Date  . Anemia   . Back pain   . Hypertrophic cardiomyopathy (HCC)     There are no active problems to display for this patient.   Past Surgical History:  Procedure Laterality Date  . ABDOMINAL SURGERY  2004   gastric bypass  . CESAREAN SECTION    . tubal rupture      Prior to Admission medications   Medication Sig Start Date End Date Taking? Authorizing Provider  dicyclomine (BENTYL) 10 MG capsule Take 1 capsule (10 mg total) by mouth 3 (three) times daily as needed for spasms. 08/17/16 08/31/16  Willy Eddy, MD  Ferrous Sulfate (IRON) 325 (65 Fe) MG TABS Take 1 tablet by mouth 3 (three) times daily. Take every other day 08/17/16   Willy Eddy, MD  polyethylene glycol [facility] Ethelene Hal) packet Take 17 g by mouth daily. Mix one tablespoon with 8oz of your favorite juice or water every day until you are having soft formed stools. Then start taking once daily if you didn't have a stool the day before. 08/17/16   Willy Eddy, MD  promethazine (PHENERGAN) 12.5 MG tablet Take 1 tablet (12.5 mg total) by mouth every 6 (six) hours as needed for nausea or vomiting.  08/17/16   Willy Eddy, MD    Allergies Clindamycin/lincomycin  No family history on file.  Social History Social History   Tobacco Use  . Smoking status: Passive Smoke Exposure - Never Smoker  . Smokeless tobacco: Never Used  Substance Use Topics  . Alcohol use: No  . Drug use: No    Review of Systems  Constitutional: No fever/chills. Eyes: No visual changes. ENT: No sore throat. Cardiovascular: Denies chest pain. Respiratory: Denies shortness of breath. Gastrointestinal: No abdominal pain.  No nausea, no vomiting.  No diarrhea.  No constipation. Genitourinary: Positive for vaginal spotting.  Negative for dysuria. Musculoskeletal: Negative for back pain. Skin: Negative for rash. Neurological: Negative for headaches, focal weakness or numbness.   ____________________________________________   PHYSICAL EXAM:  VITAL SIGNS: ED Triage Vitals [06/06/17 2141]  Enc Vitals Group     BP 125/68     Pulse Rate 71     Resp 17     Temp 98.8 F (37.1 C)     Temp Source Oral     SpO2 100 %     Weight 200 lb (90.7 kg)     Height 5\' 3"  (1.6 m)     Head Circumference      Peak Flow      Pain Score      Pain Loc      Pain Edu?      Excl. in GC?  Constitutional: Alert and oriented. Well appearing and in no acute distress. Eyes: Conjunctivae are normal. PERRL. EOMI. Head: Atraumatic. Nose: No congestion/rhinnorhea. Mouth/Throat: Mucous membranes are moist.  Oropharynx non-erythematous. Neck: No stridor.   Cardiovascular: Normal rate, regular rhythm. Grossly normal heart sounds.  Good peripheral circulation. Respiratory: Normal respiratory effort.  No retractions. Lungs CTAB. Gastrointestinal: Soft and nontender to light or deep palpation. No distention. No abdominal bruits. No CVA tenderness. Musculoskeletal: No lower extremity tenderness nor edema.  No joint effusions. Neurologic:  Normal speech and language. No gross focal neurologic deficits are appreciated.  No gait instability. Skin:  Skin is warm, dry and intact. No rash noted. Psychiatric: Mood and affect are normal. Speech and behavior are normal.  ____________________________________________   LABS (all labs ordered are listed, but only abnormal results are displayed)  Labs Reviewed  HCG, QUANTITATIVE, PREGNANCY - Abnormal; Notable for the following components:      Result Value   hCG, Beta Chain, Quant, S 422 (*)    All other components within normal limits  CBC - Abnormal; Notable for the following components:   Hemoglobin 8.4 (*)    HCT 28.0 (*)    MCV 64.1 (*)    MCH 19.2 (*)    MCHC 29.9 (*)    RDW 18.3 (*)    Platelets 462 (*)    All other components within normal limits  URINALYSIS, COMPLETE (UACMP) WITH MICROSCOPIC - Abnormal; Notable for the following components:   Color, Urine YELLOW (*)    APPearance CLEAR (*)    Squamous Epithelial / LPF 0-5 (*)    All other components within normal limits  ABO/RH   ____________________________________________  EKG  None ____________________________________________  RADIOLOGY  [place] Ob Comp Less 14 Wks  Result Date: 06/07/2017 CLINICAL DATA:  Vaginal bleeding during pregnancy. Estimated gestational age by LMP is 7 weeks 6 days. Quantitative beta HCG is 422. EXAM: OBSTETRIC <14 WK US AND TRANSVAGINAL OB US TECHNIQUE: Both transabdominal and transvaginal ultrasound examinations were performed for complete evaluation of the gestation as well as the maternal uterus, adnexal regions, and pelvic cul-de-sac. Transvaginal technique was performed to assess early pregnancy. COMPARISON:  None. FINDINGS: Intrauterine gestational sac: There is a tiny cystic collection within the endometrium which could represent an early gestational sac. Yolk sac:  Not Visualized. Embryo:  Not Visualized. Cardiac Activity: Not Visualized. MSD: 1.6  Mm, too small for dating. Subchorionic hemorrhage:  None visualized. Maternal uterus/adnexae: Focal defect in the  anterior low myometrium consistent with C-section scar. Small nabothian cysts in the cervix. Both ovaries are visualized and appear normal. No abnormal adnexal masses. Normal follicular changes. No abnormal pelvic fluid collections. IMPRESSION: Possible early intrauterine gestational sac, but no yolk sac, fetal pole, or cardiac activity yet visualized. Recommend follow-up quantitative B-HCG levels and follow-up US in 14 days to assess viability. This recommendation follows SRU consensus guidelines: Diagnostic Criteria for Nonviable Pregnancy Early in the First Trimester. Malva Limes Engl J Med 2013; 161:0960-45; 369:1443-51. Electronically Signed   By: Burman NievesWilliam  Stevens M.D.   On: 06/07/2017 00:04   [place] Ob Transvaginal  Result Date: 06/07/2017 CLINICAL DATA:  Vaginal bleeding during pregnancy. Estimated gestational age by LMP is 7 weeks 6 days. Quantitative beta HCG is 422. EXAM: OBSTETRIC <14 WK US AND TRANSVAGINAL OB US TECHNIQUE: Both transabdominal and transvaginal ultrasound examinations were performed for complete evaluation of the gestation as well as the maternal uterus, adnexal regions, and pelvic cul-de-sac. Transvaginal technique was performed to assess early pregnancy. COMPARISON:  None. FINDINGS: Intrauterine gestational sac: There is a tiny cystic collection within the endometrium which could represent an early gestational sac. Yolk sac:  Not Visualized. Embryo:  Not Visualized. Cardiac Activity: Not Visualized. MSD: 1.6  Mm, too small for dating. Subchorionic hemorrhage:  None visualized. Maternal uterus/adnexae: Focal defect in the anterior low myometrium consistent with C-section scar. Small nabothian cysts in the cervix. Both ovaries are visualized and appear normal. No abnormal adnexal masses. Normal follicular changes. No abnormal pelvic fluid collections. IMPRESSION: Possible early intrauterine gestational sac, but no yolk sac, fetal pole, or cardiac activity yet visualized. Recommend follow-up quantitative B-HCG  levels and follow-up US in 14 days to assess viability. This recommendation follows SRU consensus guidelines: Diagnostic Criteria for Nonviable Pregnancy Early in the First Trimester. Malva Limes Engl J Med 2013; [id number]:7829-56; 369:1443-51. Electronically Signed   By: Burman NievesWilliam  Stevens M.D.   On: 06/07/2017 00:04    ____________________________________________   PROCEDURES  Procedure(s) performed:   Pelvic exam: External exam within normal limits without rashes, lesions or vesicles.  Speculum exam reveals scant old blood.  Cervical os is closed.  Bimanual exam within normal limits.  Procedures  Critical Care performed: No  ____________________________________________   INITIAL IMPRESSION / ASSESSMENT AND PLAN / ED COURSE  As part of my medical decision making, I reviewed the following data within the electronic MEDICAL RECORD NUMBER Nursing notes reviewed and incorporated, Labs reviewed, Old chart reviewed and Notes from prior ED visits.   41 year old female G6P3 Ab2 approximately [redacted] weeks pregnant by dates who presents with vaginal spotting after sexual intercourse.  Beta hCG is only 422 with possible early intrauterine gestational sac seen on ultrasound.  Blood type is O+.  Patient reports history of anemia; H/H tonight is improved from 7.9/26 9 months ago. Patient not currently taking prenatal vitamins; I have strongly encouraged her to start taking them.  Awaiting urinalysis.  Clinical Course as of Jun 08 211  Wynelle LinkSun Jun 07, 2017  0210 Updated patient on negative urinalysis results.  She will return to outpatient lab in 48 hours for repeat beta hCG.  Pelvic rest instructions provided.  She will follow-up with OB/GYN next week.  Strict return precautions given.  Patient verbalizes understanding and agrees with plan of care.  [JS]    Clinical Course User Index [JS] Irean HongSung, Tracyann Duffell J, MD     ____________________________________________   FINAL CLINICAL IMPRESSION(S) / ED DIAGNOSES  Final diagnoses:  Vaginal  bleeding in pregnancy  Threatened miscarriage  Anemia, unspecified type     ED Discharge Orders    None       Note:  This document was prepared using Dragon voice recognition software and may include unintentional dictation errors.    Irean HongSung, Naiah Donahoe J, MD 06/07/17 0600

## 2017-06-07 NOTE — Discharge Instructions (Addendum)
1.  Return to outpatient lab on 11/27 to have your blood drawn for repeat blood pregnancy level. 2.  Pelvic rest until you are seen by the specialist.  Do not insert tampons, douche or have sexual intercourse. 3.  Drink plenty of fluids daily. 4. Start taking a daily prenatal vitamin. This will help your anemia.  5. Return to the ER for worsening symptoms, persistent vomiting, difficulty breathing or other concerns.

## 2017-06-08 ENCOUNTER — Other Ambulatory Visit
Admission: RE | Admit: 2017-06-08 | Discharge: 2017-06-08 | Disposition: A | Discharge status: Home / Self Care | Payer: Self-pay | Source: Ambulatory Visit | Attending: Emergency Medicine | Admitting: Emergency Medicine

## 2017-06-08 ENCOUNTER — Telehealth: Payer: Self-pay | Admitting: Obstetrics and Gynecology

## 2017-06-08 ENCOUNTER — Telehealth: Payer: Self-pay | Admitting: Emergency Medicine

## 2017-06-08 DIAGNOSIS — Z3A00 Weeks of gestation of pregnancy not specified: Secondary | ICD-10-CM | POA: Insufficient documentation

## 2017-06-08 DIAGNOSIS — O200 Threatened abortion: Secondary | ICD-10-CM | POA: Insufficient documentation

## 2017-06-08 LAB — HCG, QUANTITATIVE, PREGNANCY: HCG, BETA CHAIN, QUANT, S: 124 m[IU]/mL — AB (ref <5)

## 2017-06-08 NOTE — Telephone Encounter (Signed)
[facility] contacted office asking our office to contact pt to est care & f/u from pt's recent ED visit. I tried to contact pt. No answer. LMTCB. Thanks TNP

## 2017-06-08 NOTE — Telephone Encounter (Signed)
Pt returned call and appt is scheduled for 06/09/17. Thanks TNP

## 2017-06-08 NOTE — Telephone Encounter (Signed)
Patient called asking for hcg results from today.  I gave her results, and stressed that the results should be interpretted by an OB doctor. She says she has called for appt at [facility], but has not been called back yet.  She continues to bleed. I advised to return to Ed for severe bleeding, pain or if she gets fever.  I also called [facility] to notify of patient and that second hcg was done.  They will call her and get her established.

## 2017-06-09 ENCOUNTER — Ambulatory Visit (INDEPENDENT_AMBULATORY_CARE_PROVIDER_SITE_OTHER): Payer: Self-pay | Admitting: Obstetrics and Gynecology

## 2017-06-09 ENCOUNTER — Encounter: Payer: Self-pay | Admitting: Obstetrics and Gynecology

## 2017-06-09 DIAGNOSIS — O200 Threatened abortion: Secondary | ICD-10-CM

## 2017-06-09 NOTE — Progress Notes (Signed)
HPI:      Rebecca Lang is a 41 y.o. G1P0 who LMP was Patient's last menstrual period was 03/23/2017 (approximate).  Subjective:   She presents today after being seen in the emergency department twice in the last 4 days for vaginal bleeding and positive pregnancy test.  Patient's quant has gone from 422 down to 124.  She had significant bleeding on Sunday like a heavy menstrual.  And her bleeding is almost completely resolved at this time.  She denies pelvic pain or other symptoms. Patient has a history of ectopic pregnancy with tubal rupture in the past.  She has had 2 successful pregnancies since then. She was not using anything for birth control. Has a remote history of endometriosis reports that she has not had any problems from this since her last child 3 years ago.    Hx: The following portions of the patient's history were reviewed and updated as appropriate:             She  has a past medical history of Anemia, Back pain, and Hypertrophic cardiomyopathy (HCC). She does not have a problem list on file. She  has a past surgical history that includes Cesarean section; Abdominal surgery (2004); and tubal rupture. Her family history is not on file. She  reports that she is a non-smoker but has been exposed to tobacco smoke. she has never used smokeless tobacco. She reports that she does not drink alcohol or use drugs. She is allergic to clindamycin/lincomycin.       Review of Systems:  Review of Systems  Constitutional: Denied constitutional symptoms, night sweats, recent illness, fatigue, fever, insomnia and weight loss.  Eyes: Denied eye symptoms, eye pain, photophobia, vision change and visual disturbance.  Ears/Nose/Throat/Neck: Denied ear, nose, throat or neck symptoms, hearing loss, nasal discharge, sinus congestion and sore throat.  Cardiovascular: Denied cardiovascular symptoms, arrhythmia, chest pain/pressure, edema, exercise intolerance, orthopnea and palpitations.   Respiratory: Denied pulmonary symptoms, asthma, pleuritic pain, productive sputum, cough, dyspnea and wheezing.  Gastrointestinal: Denied, gastro-esophageal reflux, melena, nausea and vomiting.  Genitourinary: Denied genitourinary symptoms including symptomatic vaginal discharge, pelvic relaxation issues, and urinary complaints.  Musculoskeletal: Denied musculoskeletal symptoms, stiffness, swelling, muscle weakness and myalgia.  Dermatologic: Denied dermatology symptoms, rash and scar.  Neurologic: Denied neurology symptoms, dizziness, headache, neck pain and syncope.  Psychiatric: Denied psychiatric symptoms, anxiety and depression.  Endocrine: Denied endocrine symptoms including hot flashes and night sweats.   Meds:   Current Outpatient Medications on File Prior to Visit  Medication Sig Dispense Refill  . dicyclomine (BENTYL) 10 MG capsule Take 1 capsule (10 mg total) by mouth 3 (three) times daily as needed for spasms. 16 capsule 0   No current facility-administered medications on file prior to visit.     Objective:     There were no vitals filed for this visit.            Quantitative beta hCGs and ultrasounds were reviewed directly with the patient.  Assessment:    G1P0 There are no active problems to display for this patient.    1. Threatened abortion     Based on declining quant and symptoms patient likely had a miscarriages on Sunday.   Plan:            1.  We discussed miscarriage in detail.  We discussed ectopic pregnancy in detail.  Future workup discussed.  2.  Quantitative beta hCG in 1 week unless patient has additional significant symptoms.  Will follow quantitative beta hCG until 0.  3.  Future birth control versus pregnancy risks and benefits discussed in detail. Orders Orders Placed This Encounter  Procedures  . hCG, quantitative, pregnancy    No orders of the defined types were placed in this encounter.     F/U  Return in about 1 week (around  06/16/2017). I spent 32 minutes with this patient of which greater than 50% was spent discussing ectopic pregnancy, failed intrauterine pregnancy, bleeding in the first trimester, history of preeclampsia, future pregnancy risks and benefits, advanced maternal age.  Elonda Huskyavid J. Julieanne Hadsall, M.D. 06/09/2017 2:51 PM

## 2017-06-15 ENCOUNTER — Other Ambulatory Visit: Payer: Medicaid Other

## 2017-06-19 ENCOUNTER — Other Ambulatory Visit: Payer: Medicaid Other

## 2017-06-19 ENCOUNTER — Other Ambulatory Visit: Payer: Self-pay | Admitting: Obstetrics and Gynecology

## 2017-06-19 DIAGNOSIS — O200 Threatened abortion: Secondary | ICD-10-CM

## 2017-06-19 LAB — BETA HCG QUANT (REF LAB)

## 2017-08-14 ENCOUNTER — Encounter: Payer: Self-pay | Admitting: Emergency Medicine

## 2017-08-14 ENCOUNTER — Emergency Department
Admission: EM | Admit: 2017-08-14 | Discharge: 2017-08-14 | Disposition: A | Discharge status: Home / Self Care | Payer: Self-pay | Attending: Emergency Medicine | Admitting: Emergency Medicine

## 2017-08-14 ENCOUNTER — Other Ambulatory Visit: Payer: Self-pay

## 2017-08-14 DIAGNOSIS — Z7722 Contact with and (suspected) exposure to environmental tobacco smoke (acute) (chronic): Secondary | ICD-10-CM | POA: Insufficient documentation

## 2017-08-14 DIAGNOSIS — H7291 Unspecified perforation of tympanic membrane, right ear: Secondary | ICD-10-CM | POA: Insufficient documentation

## 2017-08-14 DIAGNOSIS — H608X1 Other otitis externa, right ear: Secondary | ICD-10-CM | POA: Insufficient documentation

## 2017-08-14 DIAGNOSIS — H60501 Unspecified acute noninfective otitis externa, right ear: Secondary | ICD-10-CM

## 2017-08-14 MED ORDER — CIPROFLOXACIN-DEXAMETHASONE 0.3-0.1 % OT SUSP
4.0000 [drp] | Freq: Once | OTIC | Status: AC
  Administered 2017-08-14: 4 [drp] via OTIC
  Filled 2017-08-14: qty 7.5

## 2017-08-14 MED ORDER — AMOXICILLIN-POT CLAVULANATE 875-125 MG PO TABS: 1.0000 | ORAL_TABLET | Freq: Two times a day (BID) | ORAL | 0 refills | Status: AC

## 2017-08-14 NOTE — ED Provider Notes (Signed)
[facility] Emergency Department Provider Note    First MD Initiated Contact with Patient 08/14/17 [phone]     (approximate)  I have reviewed the triage vital signs and the nursing notes.   HISTORY  Chief Complaint Ear Drainage    HPI ARISTA KETTLEWELL is a 42 y.o. 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  Patient denies any fever afebrile on presentation.   Past Medical History:  Diagnosis Date  . Anemia   . Back pain   . Hypertrophic cardiomyopathy (HCC)     There are no active problems to display for this patient.   Past Surgical History:  Procedure Laterality Date  . ABDOMINAL SURGERY  2004   gastric bypass  . CESAREAN SECTION    . tubal rupture      Prior to Admission medications   Medication Sig Start Date End Date Taking? Authorizing Provider  amoxicillin-clavulanate (AUGMENTIN) 875-125 MG tablet Take 1 tablet by mouth 2 (two) times daily for 10 days. 08/14/17 08/24/17  Darci Current, MD  dicyclomine (BENTYL) 10 MG capsule Take 1 capsule (10 mg total) by mouth 3 (three) times daily as needed for spasms. 08/17/16 08/31/16  Willy Eddy, MD    Allergies Clindamycin/lincomycin  No family history on file.  Social History Social History   Tobacco Use  . Smoking status: Passive Smoke Exposure - Never Smoker  . Smokeless tobacco: Never Used  Substance Use Topics  . Alcohol use: No  . Drug use: No    Review of Systems Constitutional: No fever/chills Eyes: No visual changes. ENT: No sore throat.  Positive for right ear pain with drainage Cardiovascular: Denies chest pain. Respiratory: Denies shortness of breath. Gastrointestinal: No abdominal pain.  No nausea, no vomiting.  No diarrhea.  No  constipation. Genitourinary: Negative for dysuria. Musculoskeletal: Negative for neck pain.  Negative for back pain. Integumentary: Negative for rash. Neurological: Negative for headaches, focal weakness or numbness.  ____________________________________________   PHYSICAL EXAM:  VITAL SIGNS: ED Triage Vitals [08/14/17 0239]  Enc Vitals Group     BP      Pulse      Resp      Temp      Temp src      SpO2      Weight 89.8 kg (198 lb)     Height 1.6 m (5\' 3" )     Head Circumference      Peak Flow      Pain Score      Pain Loc      Pain Edu?      Excl. in GC?     Constitutional: Alert and oriented. Well appearing and in no acute distress. Eyes: Conjunctivae are normal.  Head: Atraumatic. Ears: Copious discharge noted in the right external auditory canal that is yellowish white and blood-tinged.  Inability to visualize the right tympanic membrane. Nose: No congestion/rhinnorhea. Mouth/Throat: Mucous membranes are moist.  Oropharynx non-erythematous. Neck: No stridor.  Cardiovascular: Normal rate, regular rhythm. Good peripheral circulation. Grossly normal heart sounds. Respiratory: Normal respiratory effort.  No retractions. Lungs CTAB. Gastrointestinal: Soft and nontender. No distention.   Musculoskeletal: No lower extremity tenderness nor edema. No gross deformities of extremities. Neurologic:  Normal speech and language. No gross focal neurologic deficits are appreciated.  Skin:  Skin is warm, dry and intact. No rash noted. Psychiatric: Mood and affect are normal. Speech and behavior are normal.    Procedures   ____________________________________________   INITIAL IMPRESSION / ASSESSMENT AND PLAN / ED COURSE  As part of my medical decision making, I reviewed the following data within the electronic MEDICAL RECORD [id number] year old female presenting with acute otitis externa with possible TM perforation.  Patient given Ciprodex in the emergency department with  recommendation to follow-up with ENT.  Patient also given Augmentin ____________________________________________  FINAL CLINICAL IMPRESSION(S) / ED DIAGNOSES  Final diagnoses:  Acute otitis externa of right ear, unspecified type  Perforation of right tympanic membrane     MEDICATIONS GIVEN DURING THIS VISIT:  Medications  ciprofloxacin-dexamethasone (CIPRODEX) 0.3-0.1 % OTIC (EAR) suspension 4 drop (4 drops Right EAR Given 08/14/17 0400)     ED Discharge Orders        Ordered    amoxicillin-clavulanate (AUGMENTIN) 875-125 MG tablet  2 times daily     08/14/17 0401       Note:  This document was prepared using Dragon voice recognition software and may include unintentional dictation errors.    Darci CurrentBrown, Dotsero N, MD 08/14/17 [id number]

## 2017-08-14 NOTE — ED Triage Notes (Signed)
.  Patient ambulatory to triage with steady gait, without difficulty or distress noted; pt reports previous ear infection to right and "has no eardrum" ; seen here & with PCP for same; awaiting appt with ENT; having bleeding from ear tonight

## 2018-03-02 DIAGNOSIS — D649 Anemia, unspecified: Secondary | ICD-10-CM

## 2018-03-02 DIAGNOSIS — D509 Iron deficiency anemia, unspecified: Secondary | ICD-10-CM | POA: Insufficient documentation

## 2018-03-02 NOTE — Progress Notes (Signed)
[facility]  Telephone:([phone] Fax:([phone]  ID: Rebecca Lang OB: [birth year]  MR#: [id number]  VHQ#:[id number]#:[id number]  Patient Care Team: Evelene CroonNiemeyer, Meindert, MD as PCP - General (Family Medicine)  CHIEF COMPLAINT: Anemia, unspecified  INTERVAL HISTORY: Patient is a 42 year old female who was noted to have persistent anemia on routine blood work.  She currently feels well and is asymptomatic.  She does not complain of weakness or fatigue.  She has no neurologic complaints.  She denies any recent fevers or illnesses.  She has a good appetite and denies weight loss.  She has no chest pain or shortness of breath.  She denies any nausea, vomiting, constipation, or diarrhea.  She denies any melena or hematochezia.  She has no urinary complaints.  Patient offers no specific complaints today.  REVIEW OF SYSTEMS:   Review of Systems  Constitutional: Negative.  Negative for fever, malaise/fatigue and weight loss.  Respiratory: Negative.  Negative for cough and shortness of breath.   Cardiovascular: Negative.  Negative for chest pain and leg swelling.  Gastrointestinal: Negative.  Negative for abdominal pain, blood in stool and melena.  Genitourinary: Negative.  Negative for dysuria and hematuria.  Musculoskeletal: Negative.  Negative for back pain.  Skin: Negative.  Negative for rash.  Neurological: Negative.  Negative for focal weakness, weakness and headaches.  Psychiatric/Behavioral: Negative.  The patient is not nervous/anxious.     As per HPI. Otherwise, a complete review of systems is negative.  PAST MEDICAL HISTORY: Past Medical History:  Diagnosis Date  . Anemia   . Back pain   . Depression   . Hypertrophic cardiomyopathy (HCC)     PAST SURGICAL HISTORY: Past Surgical History:  Procedure Laterality Date  . ABDOMINAL SURGERY  2004   gastric bypass  . CESAREAN SECTION    . LAPAROSCOPIC GASTRIC BYPASS  2004  . tubal rupture      FAMILY  HISTORY: Family History  Problem Relation Age of Onset  . Arthritis Mother   . Other Mother        back pain w stimulator ( internal )  . Kidney disease Father   . Intestinal polyp Father   . Behavior problems Sister     ADVANCED DIRECTIVES (Y/N):  N  HEALTH MAINTENANCE: Social History   Tobacco Use  . Smoking status: Passive Smoke Exposure - Never Smoker  . Smokeless tobacco: Never Used  Substance Use Topics  . Alcohol use: No    Comment: socially beer  . Drug use: No     Colonoscopy:  PAP:  Bone density:  Lipid panel:  Allergies  Allergen Reactions  . Clindamycin/Lincomycin     Rash    Current Outpatient Medications  Medication Sig Dispense Refill  . citalopram (CELEXA) 10 MG tablet Take 10 mg by mouth daily.     No current facility-administered medications for this visit.     OBJECTIVE: Vitals:   03/03/18 1203  BP: 111/73  Pulse: 65  Resp: 18  Temp: (!) 97.4 F (36.3 C)     Body mass index is 36.24 kg/m.    ECOG FS:0 - Asymptomatic  General: Well-developed, well-nourished, no acute distress. Eyes: Pink conjunctiva, anicteric sclera. HEENT: Normocephalic, moist mucous membranes, clear oropharnyx. Lungs: Clear to auscultation bilaterally. Heart: Regular rate and rhythm. No rubs, murmurs, or gallops. Abdomen: Soft, nontender, nondistended. No organomegaly noted, normoactive bowel sounds. Musculoskeletal: No edema, cyanosis, or clubbing. Neuro: Alert, answering all questions appropriately. Cranial nerves grossly intact. Skin: No rashes or  petechiae noted. Psych: Normal affect. Lymphatics: No cervical, calvicular, axillary or inguinal LAD.   LAB RESULTS:  Lab Results  Component Value Date   NA 136 08/17/2016   K 3.4 (L) 08/17/2016   CL 106 08/17/2016   CO2 22 08/17/2016   GLUCOSE 103 (H) 08/17/2016   BUN 18 08/17/2016   CREATININE 0.83 08/17/2016   CALCIUM 8.7 (L) 08/17/2016   PROT 7.1 08/17/2016   ALBUMIN 3.6 08/17/2016   AST 22  08/17/2016   ALT 16 08/17/2016   ALKPHOS 96 08/17/2016   BILITOT 0.4 08/17/2016   GFRNONAA >60 08/17/2016   GFRAA >60 08/17/2016    Lab Results  Component Value Date   WBC 6.1 03/03/2018   NEUTROABS 12.4 (H) 11/03/2015   HGB 7.6 (L) 03/03/2018   HCT 25.3 (L) 03/03/2018   MCV 64.1 (L) 03/03/2018   PLT 354 03/03/2018   Lab Results  Component Value Date   IRON 17 (L) 03/03/2018   TIBC 487 (H) 03/03/2018   IRONPCTSAT 4 (L) 03/03/2018   Lab Results  Component Value Date   FERRITIN 3 (L) 03/03/2018     STUDIES: No results found.  ASSESSMENT: Anemia, unspecified  PLAN:   1. Anemia, unspecified: Patient's hemoglobin is significantly decreased and she is noted to have significantly reduced iron stores as well as a reduced B12 level.  The remainder of her laboratory work is either negative or within normal limits.  Her erythropoietin levels are appropriately elevated.  She has a normal hemoglobinopathy profile.  Return to clinic in 2 weeks for further evaluation and initiation of IV iron as well as intramuscular B12.  I spent a total of 45 minutes face-to-face with the patient of which greater than 50% of the visit was spent in counseling and coordination of care as detailed above.   Patient expressed understanding and was in agreement with this plan. She also understands that She can call clinic at any time with any questions, concerns, or complaints.    Jeralyn Ruthsimothy J Jartavious Mckimmy, MD   03/05/2018 7:48 PM

## 2018-03-03 ENCOUNTER — Other Ambulatory Visit: Payer: Self-pay

## 2018-03-03 ENCOUNTER — Encounter: Payer: Self-pay | Admitting: Oncology

## 2018-03-03 ENCOUNTER — Inpatient Hospital Stay: Payer: Medicaid Other | Attending: Oncology | Admitting: Oncology

## 2018-03-03 ENCOUNTER — Inpatient Hospital Stay: Payer: Medicaid Other

## 2018-03-03 DIAGNOSIS — E538 Deficiency of other specified B group vitamins: Secondary | ICD-10-CM | POA: Diagnosis not present

## 2018-03-03 DIAGNOSIS — M549 Dorsalgia, unspecified: Secondary | ICD-10-CM | POA: Insufficient documentation

## 2018-03-03 DIAGNOSIS — D649 Anemia, unspecified: Secondary | ICD-10-CM | POA: Diagnosis not present

## 2018-03-03 DIAGNOSIS — Z9884 Bariatric surgery status: Secondary | ICD-10-CM | POA: Insufficient documentation

## 2018-03-03 DIAGNOSIS — F329 Major depressive disorder, single episode, unspecified: Secondary | ICD-10-CM | POA: Insufficient documentation

## 2018-03-03 DIAGNOSIS — Z79899 Other long term (current) drug therapy: Secondary | ICD-10-CM | POA: Insufficient documentation

## 2018-03-03 DIAGNOSIS — I422 Other hypertrophic cardiomyopathy: Secondary | ICD-10-CM | POA: Insufficient documentation

## 2018-03-03 LAB — DAT, POLYSPECIFIC AHG (ARMC ONLY): POLYSPECIFIC AHG TEST: NEGATIVE

## 2018-03-03 LAB — CBC
HCT: 25.3 % — ABNORMAL LOW (ref 35.0–47.0)
HEMOGLOBIN: 7.6 g/dL — AB (ref 12.0–16.0)
MCH: 19.2 pg — ABNORMAL LOW (ref 26.0–34.0)
MCHC: 29.9 g/dL — AB (ref 32.0–36.0)
MCV: 64.1 fL — ABNORMAL LOW (ref 80.0–100.0)
Platelets: 354 10*3/uL (ref 150–440)
RBC: 3.94 MIL/uL (ref 3.80–5.20)
RDW: 18.8 % — AB (ref 11.5–14.5)
WBC: 6.1 10*3/uL (ref 3.6–11.0)

## 2018-03-03 LAB — VITAMIN B12: Vitamin B-12: 66 pg/mL — ABNORMAL LOW (ref 180–914)

## 2018-03-03 LAB — IRON AND TIBC
Iron: 17 ug/dL — ABNORMAL LOW (ref 28–170)
Saturation Ratios: 4 % — ABNORMAL LOW (ref 10.4–31.8)
TIBC: 487 ug/dL — ABNORMAL HIGH (ref 250–450)
UIBC: 470 ug/dL

## 2018-03-03 LAB — FOLATE: FOLATE: 16.6 ng/mL (ref >5.9)

## 2018-03-03 LAB — LACTATE DEHYDROGENASE: LDH: 165 U/L (ref 98–192)

## 2018-03-03 LAB — RETICULOCYTES
RBC.: 3.99 MIL/uL (ref 3.80–5.20)
RETIC COUNT ABSOLUTE: 59.9 10*3/uL (ref 19.0–183.0)
RETIC CT PCT: 1.5 % (ref 0.4–3.1)

## 2018-03-03 LAB — FERRITIN: FERRITIN: 3 ng/mL — AB (ref 11–307)

## 2018-03-03 NOTE — Progress Notes (Signed)
Here  for new pt eval  

## 2018-03-04 LAB — ERYTHROPOIETIN: Erythropoietin: 79.3 m[IU]/mL — ABNORMAL HIGH (ref 2.6–18.5)

## 2018-03-04 LAB — HEMOGLOBINOPATHY EVALUATION
HGB A2 QUANT: 1.7 % — AB (ref 1.8–3.2)
HGB A: 98.3 % (ref 96.4–98.8)
HGB C: 0 %
HGB S QUANTITAION: 0 %
Hgb F Quant: 0 % (ref 0.0–2.0)
Hgb Variant: 0 %

## 2018-03-04 LAB — HAPTOGLOBIN: Haptoglobin: 130 mg/dL (ref 34–200)

## 2018-03-21 NOTE — Progress Notes (Signed)
[facility]  Telephone:([phone] [phone]  ID: Rebecca Lang OB: Dec 19, 1975  MR#: [id number]  DEY#:[id number]  Patient Care Team: Evelene Croon, MD as PCP - General (Family Medicine)  CHIEF COMPLAINT: Iron deficiency and B12 deficiency anemia.  INTERVAL HISTORY: Patient returns to clinic today for discussion of her laboratory work and initiation of IV Feraheme and B12 injections.  She continues to have increased weakness and fatigue. She has no neurologic complaints.  She denies any recent fevers or illnesses.  She has a good appetite and denies weight loss.  She has no chest pain or shortness of breath.  She denies any nausea, vomiting, constipation, or diarrhea.  She denies any melena or hematochezia.  She has no urinary complaints.  Patient offers no further specific complaints today.  REVIEW OF SYSTEMS:   Review of Systems  Constitutional: Positive for malaise/fatigue. Negative for fever and weight loss.  Respiratory: Negative.  Negative for cough and shortness of breath.   Cardiovascular: Negative.  Negative for chest pain and leg swelling.  Gastrointestinal: Negative.  Negative for abdominal pain, blood in stool and melena.  Genitourinary: Negative.  Negative for dysuria and hematuria.  Musculoskeletal: Negative.  Negative for back pain.  Skin: Negative.  Negative for rash.  Neurological: Positive for weakness. Negative for focal weakness and headaches.  Psychiatric/Behavioral: Negative.  The patient is not nervous/anxious.     As per HPI. Otherwise, a complete review of systems is negative.  PAST MEDICAL HISTORY: Past Medical History:  Diagnosis Date  . Anemia   . Back pain   . Depression   . Hypertrophic cardiomyopathy (HCC)     PAST SURGICAL HISTORY: Past Surgical History:  Procedure Laterality Date  . ABDOMINAL SURGERY  2004   gastric bypass  . CESAREAN SECTION    . LAPAROSCOPIC GASTRIC BYPASS  2004  . tubal rupture       FAMILY HISTORY: Family History  Problem Relation Age of Onset  . Arthritis Mother   . Other Mother        back pain w stimulator ( internal )  . Kidney disease Father   . Intestinal polyp Father   . Behavior problems Sister     ADVANCED DIRECTIVES (Y/N):  N  HEALTH MAINTENANCE: Social History   Tobacco Use  . Smoking status: Passive Smoke Exposure - Never Smoker  . Smokeless tobacco: Never Used  Substance Use Topics  . Alcohol use: No    Comment: socially beer  . Drug use: No     Colonoscopy:  PAP:  Bone density:  Lipid panel:  Allergies  Allergen Reactions  . Clindamycin/Lincomycin     Rash    Current Outpatient Medications  Medication Sig Dispense Refill  . citalopram (CELEXA) 10 MG tablet Take 10 mg by mouth daily.     No current facility-administered medications for this visit.     OBJECTIVE: Vitals:   03/22/18 1029  BP: 122/77  Pulse: 62  Resp: 18  Temp: (!) 97.5 F (36.4 C)     Body mass index is 36.35 kg/m.    ECOG FS:0 - Asymptomatic  General: Well-developed, well-nourished, no acute distress. Eyes: Pink conjunctiva, anicteric sclera. HEENT: Normocephalic, moist mucous membranes. Lungs: Clear to auscultation bilaterally. Heart: Regular rate and rhythm. No rubs, murmurs, or gallops. Abdomen: Soft, nontender, nondistended. No organomegaly noted, normoactive bowel sounds. Musculoskeletal: No edema, cyanosis, or clubbing. Neuro: Alert, answering all questions appropriately. Cranial nerves grossly intact. Skin: No rashes or petechiae noted. Psych:  Normal affect.  LAB RESULTS:  Lab Results  Component Value Date   NA 136 08/17/2016   K 3.4 (L) 08/17/2016   CL 106 08/17/2016   CO2 22 08/17/2016   GLUCOSE 103 (H) 08/17/2016   BUN 18 08/17/2016   CREATININE 0.83 08/17/2016   CALCIUM 8.7 (L) 08/17/2016   PROT 7.1 08/17/2016   ALBUMIN 3.6 08/17/2016   AST 22 08/17/2016   ALT 16 08/17/2016   ALKPHOS 96 08/17/2016   BILITOT 0.4  08/17/2016   GFRNONAA >60 08/17/2016   GFRAA >60 08/17/2016    Lab Results  Component Value Date   WBC 6.1 03/03/2018   NEUTROABS 12.4 (H) 11/03/2015   HGB 7.6 (L) 03/03/2018   HCT 25.3 (L) 03/03/2018   MCV 64.1 (L) 03/03/2018   PLT 354 03/03/2018   Lab Results  Component Value Date   IRON 17 (L) 03/03/2018   TIBC 487 (H) 03/03/2018   IRONPCTSAT 4 (L) 03/03/2018   Lab Results  Component Value Date   FERRITIN 3 (L) 03/03/2018     STUDIES: No results found.  ASSESSMENT: Iron deficiency and B12 deficiency anemia  PLAN:   1. Iron deficiency and B12 deficiency anemia: Likely secondary to poor absorption from patient's history of gastric bypass surgery.  Patient's hemoglobin is significantly decreased and she is noted to have significantly reduced iron stores as well as a reduced B12 level.  Previously, the remainder of her laboratory work was either negative or within normal limits.  Her erythropoietin levels are appropriately elevated.  She has a normal hemoglobinopathy profile.  Proceed with 510 mg IV Feraheme today.  Patient will also receive 1000 mcg IM B12 today.  Return to clinic in 1 week for second infusion of IV Feraheme.  Patient will then return monthly for B12 injections and then in 4 months with repeat laboratory work and further evaluation.  I spent a total of 30 minutes face-to-face with the patient of which greater than 50% of the visit was spent in counseling and coordination of care as detailed above.   Patient expressed understanding and was in agreement with this plan. She also understands that She can call clinic at any time with any questions, concerns, or complaints.    Jeralyn Ruths, MD   03/23/2018 10:19 AM

## 2018-03-22 ENCOUNTER — Inpatient Hospital Stay: Payer: Medicaid Other | Attending: Oncology | Admitting: Oncology

## 2018-03-22 ENCOUNTER — Encounter: Payer: Self-pay | Admitting: Oncology

## 2018-03-22 ENCOUNTER — Inpatient Hospital Stay: Payer: Medicaid Other

## 2018-03-22 VITALS — BP 124/81 | HR 56 | Temp 96.0°F | Resp 18

## 2018-03-22 VITALS — BP 122/77 | HR 62 | Temp 97.5°F | Resp 18 | Wt 218.2 lb

## 2018-03-22 DIAGNOSIS — E538 Deficiency of other specified B group vitamins: Secondary | ICD-10-CM | POA: Diagnosis not present

## 2018-03-22 DIAGNOSIS — Z79899 Other long term (current) drug therapy: Secondary | ICD-10-CM | POA: Insufficient documentation

## 2018-03-22 DIAGNOSIS — I422 Other hypertrophic cardiomyopathy: Secondary | ICD-10-CM

## 2018-03-22 DIAGNOSIS — F329 Major depressive disorder, single episode, unspecified: Secondary | ICD-10-CM | POA: Diagnosis not present

## 2018-03-22 DIAGNOSIS — M549 Dorsalgia, unspecified: Secondary | ICD-10-CM | POA: Diagnosis not present

## 2018-03-22 DIAGNOSIS — D509 Iron deficiency anemia, unspecified: Secondary | ICD-10-CM | POA: Diagnosis not present

## 2018-03-22 DIAGNOSIS — Z9884 Bariatric surgery status: Secondary | ICD-10-CM | POA: Diagnosis not present

## 2018-03-22 MED ORDER — SODIUM CHLORIDE 0.9 % IV SOLN
Freq: Once | INTRAVENOUS | Status: AC
  Administered 2018-03-22: 12:00:00 via INTRAVENOUS
  Filled 2018-03-22: qty 250

## 2018-03-22 MED ORDER — SODIUM CHLORIDE 0.9 % IV SOLN
510.0000 mg | Freq: Once | INTRAVENOUS | Status: AC
  Administered 2018-03-22: 510 mg via INTRAVENOUS
  Filled 2018-03-22: qty 17

## 2018-03-22 MED ORDER — CYANOCOBALAMIN 1000 MCG/ML IJ SOLN
1000.0000 ug | Freq: Once | INTRAMUSCULAR | Status: AC
  Administered 2018-03-22: 1000 ug via INTRAMUSCULAR
  Filled 2018-03-22: qty 1

## 2018-03-22 NOTE — Progress Notes (Signed)
Pt tolerated infusion well. Pt and VS stable at discharge.  

## 2018-03-22 NOTE — Progress Notes (Signed)
Patient denies any concerns today.  

## 2018-03-29 ENCOUNTER — Inpatient Hospital Stay: Payer: Medicaid Other

## 2018-03-29 VITALS — BP 127/83 | HR 66 | Temp 97.6°F | Resp 18

## 2018-03-29 DIAGNOSIS — D509 Iron deficiency anemia, unspecified: Secondary | ICD-10-CM

## 2018-03-29 MED ORDER — SODIUM CHLORIDE 0.9 % IV SOLN
Freq: Once | INTRAVENOUS | Status: AC
  Administered 2018-03-29: 12:00:00 via INTRAVENOUS
  Filled 2018-03-29: qty 250

## 2018-03-29 MED ORDER — SODIUM CHLORIDE 0.9 % IV SOLN
510.0000 mg | Freq: Once | INTRAVENOUS | Status: AC
  Administered 2018-03-29: 510 mg via INTRAVENOUS
  Filled 2018-03-29: qty 17

## 2018-04-12 ENCOUNTER — Other Ambulatory Visit: Payer: Medicaid Other

## 2018-04-12 ENCOUNTER — Ambulatory Visit: Payer: Medicaid Other | Admitting: Oncology

## 2018-04-19 ENCOUNTER — Inpatient Hospital Stay: Payer: Medicaid Other

## 2018-04-20 ENCOUNTER — Inpatient Hospital Stay: Payer: Medicaid Other | Attending: Oncology

## 2018-04-20 DIAGNOSIS — E538 Deficiency of other specified B group vitamins: Secondary | ICD-10-CM | POA: Insufficient documentation

## 2018-04-20 DIAGNOSIS — Z79899 Other long term (current) drug therapy: Secondary | ICD-10-CM | POA: Diagnosis not present

## 2018-04-20 DIAGNOSIS — D509 Iron deficiency anemia, unspecified: Secondary | ICD-10-CM

## 2018-04-20 MED ORDER — CYANOCOBALAMIN 1000 MCG/ML IJ SOLN
1000.0000 ug | Freq: Once | INTRAMUSCULAR | Status: AC
  Administered 2018-04-20: 1000 ug via INTRAMUSCULAR

## 2018-05-17 ENCOUNTER — Inpatient Hospital Stay: Payer: Medicaid Other | Attending: Oncology

## 2018-05-17 DIAGNOSIS — Z79899 Other long term (current) drug therapy: Secondary | ICD-10-CM | POA: Insufficient documentation

## 2018-05-17 DIAGNOSIS — D509 Iron deficiency anemia, unspecified: Secondary | ICD-10-CM

## 2018-05-17 DIAGNOSIS — E538 Deficiency of other specified B group vitamins: Secondary | ICD-10-CM | POA: Insufficient documentation

## 2018-05-17 MED ORDER — CYANOCOBALAMIN 1000 MCG/ML IJ SOLN
1000.0000 ug | Freq: Once | INTRAMUSCULAR | Status: AC
  Administered 2018-05-17: 1000 ug via INTRAMUSCULAR

## 2018-06-14 ENCOUNTER — Inpatient Hospital Stay: Payer: Medicaid Other | Attending: Oncology

## 2018-06-14 DIAGNOSIS — D509 Iron deficiency anemia, unspecified: Secondary | ICD-10-CM

## 2018-06-14 DIAGNOSIS — E538 Deficiency of other specified B group vitamins: Secondary | ICD-10-CM | POA: Diagnosis present

## 2018-06-14 DIAGNOSIS — Z79899 Other long term (current) drug therapy: Secondary | ICD-10-CM | POA: Insufficient documentation

## 2018-06-14 MED ORDER — CYANOCOBALAMIN 1000 MCG/ML IJ SOLN
1000.0000 ug | Freq: Once | INTRAMUSCULAR | Status: AC
  Administered 2018-06-14: 1000 ug via INTRAMUSCULAR

## 2018-07-16 NOTE — Progress Notes (Signed)
[facility]  Telephone:(336) [phone] Fax:(336) [phone]  ID: Thompson Caul OB: [birth year]  MR#: [id number]  DPO#:[id number]  Patient Care Team: Evelene Croon, MD as PCP - General (Family Medicine)  CHIEF COMPLAINT: Iron deficiency and B12 deficiency anemia.  INTERVAL HISTORY: Patient returns to clinic today for repeat laboratory work and further evaluation.  She continues to have persistent weakness and fatigue, but admits it has significantly improved since receiving IV Feraheme and B12 injections.  She otherwise feels well.  She has no neurologic complaints.  She denies any recent fevers or illnesses.  She has a good appetite and denies weight loss.  She has no chest pain or shortness of breath.  She denies any nausea, vomiting, constipation, or diarrhea.  She denies any melena or hematochezia.  She has no urinary complaints.  Patient offers no further specific complaints today.  REVIEW OF SYSTEMS:   Review of Systems  Constitutional: Positive for malaise/fatigue. Negative for fever and weight loss.  Respiratory: Negative.  Negative for cough and shortness of breath.   Cardiovascular: Negative.  Negative for chest pain and leg swelling.  Gastrointestinal: Negative.  Negative for abdominal pain, blood in stool and melena.  Genitourinary: Negative.  Negative for dysuria and hematuria.  Musculoskeletal: Negative.  Negative for back pain.  Skin: Negative.  Negative for rash.  Neurological: Positive for weakness. Negative for focal weakness and headaches.  Psychiatric/Behavioral: Negative.  The patient is not nervous/anxious.     As per HPI. Otherwise, a complete review of systems is negative.  PAST MEDICAL HISTORY: Past Medical History:  Diagnosis Date  . Anemia   . Back pain   . Depression   . Hypertrophic cardiomyopathy (HCC)     PAST SURGICAL HISTORY: Past Surgical History:  Procedure Laterality Date  . ABDOMINAL SURGERY  2004   gastric bypass  .  CESAREAN SECTION    . LAPAROSCOPIC GASTRIC BYPASS  2004  . tubal rupture      FAMILY HISTORY: Family History  Problem Relation Age of Onset  . Arthritis Mother   . Other Mother        back pain w stimulator ( internal )  . Kidney disease Father   . Intestinal polyp Father   . Behavior problems Sister     ADVANCED DIRECTIVES (Y/N):  N  HEALTH MAINTENANCE: Social History   Tobacco Use  . Smoking status: Passive Smoke Exposure - Never Smoker  . Smokeless tobacco: Never Used  Substance Use Topics  . Alcohol use: No    Comment: socially beer  . Drug use: No     Colonoscopy:  PAP:  Bone density:  Lipid panel:  Allergies  Allergen Reactions  . Clindamycin/Lincomycin     Rash    No current outpatient medications on file.   No current facility-administered medications for this visit.     OBJECTIVE: Vitals:   07/19/18 1324  BP: 127/83  Pulse: (!) 55  Temp: (!) 97.3 F (36.3 C)     Body mass index is 39.14 kg/m.    ECOG FS:0 - Asymptomatic  General: Well-developed, well-nourished, no acute distress. Eyes: Pink conjunctiva, anicteric sclera. HEENT: Normocephalic, moist mucous membranes. Lungs: Clear to auscultation bilaterally. Heart: Regular rate and rhythm. No rubs, murmurs, or gallops. Abdomen: Soft, nontender, nondistended. No organomegaly noted, normoactive bowel sounds. Musculoskeletal: No edema, cyanosis, or clubbing. Neuro: Alert, answering all questions appropriately. Cranial nerves grossly intact. Skin: No rashes or petechiae noted. Psych: Normal affect.  LAB RESULTS:  Lab  Results  Component Value Date   NA 136 08/17/2016   K 3.4 (L) 08/17/2016   CL 106 08/17/2016   CO2 22 08/17/2016   GLUCOSE 103 (H) 08/17/2016   BUN 18 08/17/2016   CREATININE 0.83 08/17/2016   CALCIUM 8.7 (L) 08/17/2016   PROT 7.1 08/17/2016   ALBUMIN 3.6 08/17/2016   AST 22 08/17/2016   ALT 16 08/17/2016   ALKPHOS 96 08/17/2016   BILITOT 0.4 08/17/2016   GFRNONAA  >60 08/17/2016   GFRAA >60 08/17/2016    Lab Results  Component Value Date   WBC 6.8 07/19/2018   NEUTROABS 4.6 07/19/2018   HGB 11.7 (L) 07/19/2018   HCT 36.5 07/19/2018   MCV 88.2 07/19/2018   PLT 325 07/19/2018   Lab Results  Component Value Date   IRON 17 (L) 03/03/2018   TIBC 487 (H) 03/03/2018   IRONPCTSAT 4 (L) 03/03/2018   Lab Results  Component Value Date   FERRITIN 3 (L) 03/03/2018     STUDIES: No results found.  ASSESSMENT: Iron deficiency and B12 deficiency anemia  PLAN:   1. Iron deficiency and B12 deficiency anemia: Likely secondary to poor absorption from patient's history of gastric bypass surgery.  Previously, the remainder of her laboratory work was either negative or within normal limits.  Patient's hemoglobin has significantly improved and is now 11.7.  Iron stores were not drawn today.  She does not require additional IV Feraheme, but will continue with monthly B12 injections.  Return to clinic monthly for her B12 and then in 4 months with repeat laboratory work and further evaluation.    I spent a total of 30 minutes face-to-face with the patient of which greater than 50% of the visit was spent in counseling and coordination of care as detailed above.   Patient expressed understanding and was in agreement with this plan. She also understands that She can call clinic at any time with any questions, concerns, or complaints.    Jeralyn Ruths, MD   07/21/2018 6:52 AM

## 2018-07-19 ENCOUNTER — Inpatient Hospital Stay: Payer: Medicaid Other

## 2018-07-19 ENCOUNTER — Inpatient Hospital Stay (HOSPITAL_BASED_OUTPATIENT_CLINIC_OR_DEPARTMENT_OTHER): Payer: Medicaid Other | Admitting: Oncology

## 2018-07-19 ENCOUNTER — Inpatient Hospital Stay: Payer: Medicaid Other | Attending: Oncology

## 2018-07-19 ENCOUNTER — Other Ambulatory Visit: Payer: Self-pay

## 2018-07-19 VITALS — BP 127/83 | HR 55 | Temp 97.3°F | Ht 64.0 in | Wt 228.0 lb

## 2018-07-19 DIAGNOSIS — R5383 Other fatigue: Secondary | ICD-10-CM | POA: Diagnosis not present

## 2018-07-19 DIAGNOSIS — I422 Other hypertrophic cardiomyopathy: Secondary | ICD-10-CM

## 2018-07-19 DIAGNOSIS — D509 Iron deficiency anemia, unspecified: Secondary | ICD-10-CM

## 2018-07-19 DIAGNOSIS — F329 Major depressive disorder, single episode, unspecified: Secondary | ICD-10-CM | POA: Diagnosis not present

## 2018-07-19 DIAGNOSIS — Z9884 Bariatric surgery status: Secondary | ICD-10-CM

## 2018-07-19 DIAGNOSIS — R531 Weakness: Secondary | ICD-10-CM | POA: Diagnosis not present

## 2018-07-19 DIAGNOSIS — E538 Deficiency of other specified B group vitamins: Secondary | ICD-10-CM | POA: Diagnosis not present

## 2018-07-19 DIAGNOSIS — M549 Dorsalgia, unspecified: Secondary | ICD-10-CM | POA: Insufficient documentation

## 2018-07-19 DIAGNOSIS — D649 Anemia, unspecified: Secondary | ICD-10-CM

## 2018-07-19 LAB — CBC WITH DIFFERENTIAL/PLATELET
Abs Immature Granulocytes: 0.02 10*3/uL (ref 0.00–0.07)
BASOS ABS: 0 10*3/uL (ref 0.0–0.1)
Basophils Relative: 0 %
EOS ABS: 0.1 10*3/uL (ref 0.0–0.5)
Eosinophils Relative: 1 %
HCT: 36.5 % (ref 36.0–46.0)
Hemoglobin: 11.7 g/dL — ABNORMAL LOW (ref 12.0–15.0)
IMMATURE GRANULOCYTES: 0 %
Lymphocytes Relative: 24 %
Lymphs Abs: 1.6 10*3/uL (ref 0.7–4.0)
MCH: 28.3 pg (ref 26.0–34.0)
MCHC: 32.1 g/dL (ref 30.0–36.0)
MCV: 88.2 fL (ref 80.0–100.0)
Monocytes Absolute: 0.5 10*3/uL (ref 0.1–1.0)
Monocytes Relative: 7 %
NRBC: 0 % (ref 0.0–0.2)
Neutro Abs: 4.6 10*3/uL (ref 1.7–7.7)
Neutrophils Relative %: 68 %
Platelets: 325 10*3/uL (ref 150–400)
RBC: 4.14 MIL/uL (ref 3.87–5.11)
RDW: 14.4 % (ref 11.5–15.5)
WBC: 6.8 10*3/uL (ref 4.0–10.5)

## 2018-07-19 MED ORDER — CYANOCOBALAMIN 1000 MCG/ML IJ SOLN
1000.0000 ug | Freq: Once | INTRAMUSCULAR | Status: AC
  Administered 2018-07-19: 1000 ug via INTRAMUSCULAR
  Filled 2018-07-19: qty 1

## 2018-07-19 NOTE — Progress Notes (Signed)
Patient is here today to follow up on her iron deficiency anemia. Patient stated that she stays tired and fatigud all the time. Patient denied fever, chills, nausea, vomiting, constipation and/or diarrhea.

## 2018-08-19 ENCOUNTER — Inpatient Hospital Stay: Payer: Medicaid Other | Attending: Oncology

## 2018-08-27 ENCOUNTER — Emergency Department
Admission: EM | Admit: 2018-08-27 | Discharge: 2018-08-27 | Disposition: A | Discharge status: Home / Self Care | Payer: Medicaid Other | Attending: Emergency Medicine | Admitting: Emergency Medicine

## 2018-08-27 ENCOUNTER — Encounter: Payer: Self-pay | Admitting: Emergency Medicine

## 2018-08-27 ENCOUNTER — Other Ambulatory Visit: Payer: Self-pay

## 2018-08-27 DIAGNOSIS — N390 Urinary tract infection, site not specified: Secondary | ICD-10-CM | POA: Insufficient documentation

## 2018-08-27 DIAGNOSIS — Z7722 Contact with and (suspected) exposure to environmental tobacco smoke (acute) (chronic): Secondary | ICD-10-CM | POA: Insufficient documentation

## 2018-08-27 DIAGNOSIS — B349 Viral infection, unspecified: Secondary | ICD-10-CM | POA: Diagnosis not present

## 2018-08-27 DIAGNOSIS — R51 Headache: Secondary | ICD-10-CM | POA: Diagnosis present

## 2018-08-27 LAB — URINALYSIS, COMPLETE (UACMP) WITH MICROSCOPIC
Bilirubin Urine: NEGATIVE
Glucose, UA: NEGATIVE mg/dL
KETONES UR: 5 mg/dL — AB
Leukocytes,Ua: NEGATIVE
Nitrite: POSITIVE — AB
Protein, ur: NEGATIVE mg/dL
Specific Gravity, Urine: 1.019 (ref 1.005–1.030)
pH: 5 (ref 5.0–8.0)

## 2018-08-27 LAB — INFLUENZA PANEL BY PCR (TYPE A & B)
INFLBPCR: NEGATIVE
Influenza A By PCR: NEGATIVE

## 2018-08-27 MED ORDER — FLUCONAZOLE 150 MG PO TABS: 150.0000 mg | ORAL_TABLET | Freq: Once | ORAL | 1 refills | Status: AC

## 2018-08-27 MED ORDER — CEPHALEXIN 500 MG PO CAPS
500.0000 mg | ORAL_CAPSULE | Freq: Once | ORAL | Status: AC
  Administered 2018-08-27: 500 mg via ORAL
  Filled 2018-08-27: qty 1

## 2018-08-27 MED ORDER — CEPHALEXIN 500 MG PO CAPS: 500.0000 mg | ORAL_CAPSULE | Freq: Two times a day (BID) | ORAL | 0 refills | Status: DC

## 2018-08-27 NOTE — ED Provider Notes (Signed)
[facility] Emergency Department Provider Note   ____________________________________________    I have reviewed the triage vital signs and the nursing notes.   HISTORY  Chief Complaint Headache; Chills; and Generalized Body Aches     HPI Rebecca Lang is a 43 y.o. female who presents with complaints of headache chills, myalgias, fatigue which started this morning.  Patient reports she has never had the flu before but is worried that she may have it.  She denies recent travel.  No nausea or vomiting.  No dysuria.  No abdominal pain.  No chest pain.  No shortness of breath or cough.  Has not take anything for this  Past Medical History:  Diagnosis Date  . Anemia   . Back pain   . Depression   . Hypertrophic cardiomyopathy [facility])     Patient Active Problem List   Diagnosis Date Noted  . Anemia, unspecified 03/02/2018    Past Surgical History:  Procedure Laterality Date  . ABDOMINAL SURGERY  2004   gastric bypass  . CESAREAN SECTION    . INNER EAR SURGERY    . LAPAROSCOPIC GASTRIC BYPASS  2004  . tubal rupture      Prior to Admission medications   Medication Sig Start Date End Date Taking? Authorizing Provider  cephALEXin (KEFLEX) 500 MG capsule Take 1 capsule (500 mg total) by mouth 2 (two) times daily. 08/27/18   Jene Every, MD  fluconazole (DIFLUCAN) 150 MG tablet Take 1 tablet (150 mg total) by mouth once for 1 dose. 08/27/18 08/27/18  Jene Every, MD     Allergies Clindamycin/lincomycin  Family History  Problem Relation Age of Onset  . Arthritis Mother   . Other Mother        back pain w stimulator ( internal )  . Kidney disease Father   . Intestinal polyp Father   . Behavior problems Sister     Social History Social History   Tobacco Use  . Smoking status: Passive Smoke Exposure - Never Smoker  . Smokeless tobacco: Never Used  Substance Use Topics  . Alcohol use: Yes    Comment: socially beer  . Drug use: No     Review of Systems  Constitutional: Positive chills  ENT: No sore throat.   Gastrointestinal: No abdominal pain.  No nausea, no vomiting.    Musculoskeletal: Positive myalgias Skin: Negative for rash. Neurological: Intermittent headaches    ____________________________________________   PHYSICAL EXAM:  VITAL SIGNS: ED Triage Vitals [08/27/18 0146]  Enc Vitals Group     BP (!) 143/76     Pulse Rate 79     Resp 18     Temp 98.9 F (37.2 C)     Temp Source Oral     SpO2 99 %     Weight 104.3 kg (230 lb)     Height 1.626 m (5\' 4" )     Head Circumference      Peak Flow      Pain Score 6     Pain Loc      Pain Edu?      Excl. in GC?      Constitutional: Alert and oriented. No acute distress. Pleasant and interactive Eyes: Conjunctivae are normal.   Nose: No congestion/rhinnorhea. Mouth/Throat: Mucous membranes are moist.   Cardiovascular: Normal rate, regular rhythm.  Respiratory: Normal respiratory effort.  No retractions.  There to auscultation bilaterally  Musculoskeletal: No lower extremity tenderness nor edema.   Neurologic:  Normal speech and language. No gross focal neurologic deficits are appreciated.   Skin:  Skin is warm, dry and intact. No rash noted.   ____________________________________________   LABS (all labs ordered are listed, but only abnormal results are displayed)  Labs Reviewed  URINALYSIS, COMPLETE (UACMP) WITH MICROSCOPIC - Abnormal; Notable for the following components:      Result Value   Color, Urine YELLOW (*)    APPearance CLEAR (*)    Hgb urine dipstick SMALL (*)    Ketones, ur 5 (*)    Nitrite POSITIVE (*)    Bacteria, UA FEW (*)    All other components within normal limits  INFLUENZA PANEL BY PCR (TYPE A & B)   ____________________________________________  EKG   ____________________________________________  RADIOLOGY  None ____________________________________________   PROCEDURES  Procedure(s)  performed: No  Procedures   Critical Care performed: No ____________________________________________   INITIAL IMPRESSION / ASSESSMENT AND PLAN / ED COURSE  Pertinent labs & imaging results that were available during my care of the patient were reviewed by me and considered in my medical decision making (see chart for details).  Patient presents with influenza-like symptoms, however influenza is negative, suspect nonspecific viral illness as the cause.  Recommend supportive care.  Urinalysis does demonstrate positive nitrites will treat with Keflex   ____________________________________________   FINAL CLINICAL IMPRESSION(S) / ED DIAGNOSES  Final diagnoses:  Viral illness  Lower urinary tract infectious disease      NEW MEDICATIONS STARTED DURING THIS VISIT:  New Prescriptions   CEPHALEXIN (KEFLEX) 500 MG CAPSULE    Take 1 capsule (500 mg total) by mouth 2 (two) times daily.   FLUCONAZOLE (DIFLUCAN) 150 MG TABLET    Take 1 tablet (150 mg total) by mouth once for 1 dose.     Note:  This document was prepared using Dragon voice recognition software and may include unintentional dictation errors.   Jene Every, MD 08/27/18 [phone]

## 2018-08-27 NOTE — ED Triage Notes (Signed)
Pt presents to ED with chills, body aches, and headache today. Worse the past few hours. Pt states she changed her diet to gluten free a little over a month ago and is not sure if that is related to her current symptoms. Denies nausea currently but states she was earlier.

## 2018-09-16 ENCOUNTER — Inpatient Hospital Stay: Payer: Medicaid Other | Attending: Oncology

## 2018-09-24 ENCOUNTER — Inpatient Hospital Stay: Payer: Medicaid Other

## 2018-10-13 ENCOUNTER — Other Ambulatory Visit: Payer: Self-pay

## 2018-10-14 ENCOUNTER — Inpatient Hospital Stay: Payer: Medicaid Other | Attending: Oncology

## 2018-10-14 ENCOUNTER — Other Ambulatory Visit: Payer: Self-pay

## 2018-10-14 DIAGNOSIS — D509 Iron deficiency anemia, unspecified: Secondary | ICD-10-CM | POA: Insufficient documentation

## 2018-10-14 DIAGNOSIS — E538 Deficiency of other specified B group vitamins: Secondary | ICD-10-CM | POA: Insufficient documentation

## 2018-10-14 DIAGNOSIS — Z79899 Other long term (current) drug therapy: Secondary | ICD-10-CM | POA: Diagnosis not present

## 2018-10-14 MED ORDER — CYANOCOBALAMIN 1000 MCG/ML IJ SOLN
1000.0000 ug | Freq: Once | INTRAMUSCULAR | Status: AC
  Administered 2018-10-14: 1000 ug via INTRAMUSCULAR

## 2018-11-22 ENCOUNTER — Other Ambulatory Visit: Payer: Medicaid Other

## 2018-11-22 ENCOUNTER — Ambulatory Visit: Payer: Medicaid Other | Admitting: Oncology

## 2018-11-22 ENCOUNTER — Ambulatory Visit: Payer: Medicaid Other

## 2018-12-02 ENCOUNTER — Encounter: Payer: Self-pay | Admitting: Emergency Medicine

## 2018-12-02 ENCOUNTER — Observation Stay
Admission: EM | Admit: 2018-12-02 | Discharge: 2018-12-04 | Disposition: A | Discharge status: Home / Self Care | Payer: Medicaid Other | Attending: Obstetrics and Gynecology | Admitting: Obstetrics and Gynecology

## 2018-12-02 ENCOUNTER — Other Ambulatory Visit: Payer: Self-pay

## 2018-12-02 ENCOUNTER — Emergency Department: Payer: Medicaid Other

## 2018-12-02 DIAGNOSIS — Z791 Long term (current) use of non-steroidal anti-inflammatories (NSAID): Secondary | ICD-10-CM | POA: Insufficient documentation

## 2018-12-02 DIAGNOSIS — O99341 Other mental disorders complicating pregnancy, first trimester: Secondary | ICD-10-CM | POA: Diagnosis not present

## 2018-12-02 DIAGNOSIS — F329 Major depressive disorder, single episode, unspecified: Secondary | ICD-10-CM | POA: Diagnosis not present

## 2018-12-02 DIAGNOSIS — I422 Other hypertrophic cardiomyopathy: Secondary | ICD-10-CM | POA: Diagnosis not present

## 2018-12-02 DIAGNOSIS — Z881 Allergy status to other antibiotic agents status: Secondary | ICD-10-CM | POA: Insufficient documentation

## 2018-12-02 DIAGNOSIS — O00101 Right tubal pregnancy without intrauterine pregnancy: Secondary | ICD-10-CM | POA: Diagnosis present

## 2018-12-02 DIAGNOSIS — O99011 Anemia complicating pregnancy, first trimester: Secondary | ICD-10-CM | POA: Diagnosis not present

## 2018-12-02 DIAGNOSIS — Z818 Family history of other mental and behavioral disorders: Secondary | ICD-10-CM | POA: Diagnosis not present

## 2018-12-02 DIAGNOSIS — Z8261 Family history of arthritis: Secondary | ICD-10-CM | POA: Diagnosis not present

## 2018-12-02 DIAGNOSIS — N736 Female pelvic peritoneal adhesions (postinfective): Secondary | ICD-10-CM | POA: Diagnosis not present

## 2018-12-02 DIAGNOSIS — Z79899 Other long term (current) drug therapy: Secondary | ICD-10-CM | POA: Diagnosis not present

## 2018-12-02 DIAGNOSIS — N8311 Corpus luteum cyst of right ovary: Secondary | ICD-10-CM | POA: Insufficient documentation

## 2018-12-02 DIAGNOSIS — N83291 Other ovarian cyst, right side: Secondary | ICD-10-CM | POA: Diagnosis not present

## 2018-12-02 DIAGNOSIS — Z841 Family history of disorders of kidney and ureter: Secondary | ICD-10-CM | POA: Insufficient documentation

## 2018-12-02 DIAGNOSIS — O00109 Unspecified tubal pregnancy without intrauterine pregnancy: Secondary | ICD-10-CM | POA: Diagnosis present

## 2018-12-02 DIAGNOSIS — N8301 Follicular cyst of right ovary: Secondary | ICD-10-CM | POA: Insufficient documentation

## 2018-12-02 DIAGNOSIS — Z1159 Encounter for screening for other viral diseases: Secondary | ICD-10-CM | POA: Diagnosis not present

## 2018-12-02 DIAGNOSIS — Z8371 Family history of colonic polyps: Secondary | ICD-10-CM | POA: Diagnosis not present

## 2018-12-02 DIAGNOSIS — O99841 Bariatric surgery status complicating pregnancy, first trimester: Secondary | ICD-10-CM | POA: Diagnosis not present

## 2018-12-02 DIAGNOSIS — Z3A01 Less than 8 weeks gestation of pregnancy: Secondary | ICD-10-CM | POA: Insufficient documentation

## 2018-12-02 LAB — CBC WITH DIFFERENTIAL/PLATELET
Abs Immature Granulocytes: 0.02 10*3/uL (ref 0.00–0.07)
Basophils Absolute: 0 10*3/uL (ref 0.0–0.1)
Basophils Relative: 0 %
Eosinophils Absolute: 0.1 10*3/uL (ref 0.0–0.5)
Eosinophils Relative: 1 %
HCT: 37.3 % (ref 36.0–46.0)
Hemoglobin: 12 g/dL (ref 12.0–15.0)
Immature Granulocytes: 0 %
Lymphocytes Relative: 29 %
Lymphs Abs: 2.9 10*3/uL (ref 0.7–4.0)
MCH: 27.6 pg (ref 26.0–34.0)
MCHC: 32.2 g/dL (ref 30.0–36.0)
MCV: 85.9 fL (ref 80.0–100.0)
Monocytes Absolute: 0.8 10*3/uL (ref 0.1–1.0)
Monocytes Relative: 8 %
Neutro Abs: 6.2 10*3/uL (ref 1.7–7.7)
Neutrophils Relative %: 62 %
Platelets: 359 10*3/uL (ref 150–400)
RBC: 4.34 MIL/uL (ref 3.87–5.11)
RDW: 15.4 % (ref 11.5–15.5)
WBC: 10 10*3/uL (ref 4.0–10.5)
nRBC: 0 % (ref 0.0–0.2)

## 2018-12-02 LAB — URINALYSIS, COMPLETE (UACMP) WITH MICROSCOPIC
Bacteria, UA: NONE SEEN
Bilirubin Urine: NEGATIVE
Glucose, UA: NEGATIVE mg/dL
Ketones, ur: NEGATIVE mg/dL
Leukocytes,Ua: NEGATIVE
Nitrite: NEGATIVE
Protein, ur: NEGATIVE mg/dL
Specific Gravity, Urine: 1.024 (ref 1.005–1.030)
pH: 5 (ref 5.0–8.0)

## 2018-12-02 LAB — HCG, QUANTITATIVE, PREGNANCY: hCG, Beta Chain, Quant, S: 4802 m[IU]/mL — ABNORMAL HIGH (ref <5)

## 2018-12-02 LAB — COMPREHENSIVE METABOLIC PANEL
ALT: 41 U/L (ref 0–44)
AST: 35 U/L (ref 15–41)
Albumin: 3.8 g/dL (ref 3.5–5.0)
Alkaline Phosphatase: 103 U/L (ref 38–126)
Anion gap: 8 (ref 5–15)
BUN: 14 mg/dL (ref 6–20)
CO2: 23 mmol/L (ref 22–32)
Calcium: 9.2 mg/dL (ref 8.9–10.3)
Chloride: 106 mmol/L (ref 98–111)
Creatinine, Ser: 0.51 mg/dL (ref 0.44–1.00)
GFR calc Af Amer: >60 mL/min (ref >60)
GFR calc non Af Amer: >60 mL/min (ref >60)
Glucose, Bld: 101 mg/dL — ABNORMAL HIGH (ref 70–99)
Potassium: 4 mmol/L (ref 3.5–5.1)
Sodium: 137 mmol/L (ref 135–145)
Total Bilirubin: 0.4 mg/dL (ref 0.3–1.2)
Total Protein: 7.6 g/dL (ref 6.5–8.1)

## 2018-12-02 LAB — POCT PREGNANCY, URINE: Preg Test, Ur: POSITIVE — AB

## 2018-12-02 NOTE — ED Notes (Signed)
Pt to u/s accomp by u/s tech 

## 2018-12-02 NOTE — ED Triage Notes (Addendum)
Patient ambulatory to triage with steady gait, without difficulty or distress noted; Pt st approx [redacted]wks pregnant; G7P3; reports vag bleeding today with no pain; blood type O+ listed in results

## 2018-12-02 NOTE — ED Provider Notes (Signed)
[facility] Emergency Department Provider Note    First MD Initiated Contact with Patient 12/02/18 2320     (approximate)  I have reviewed the triage vital signs and the nursing notes.   HISTORY  Chief Complaint Vaginal Bleeding    HPI Rebecca Lang is a 43 y.o. female G7, P3 (previous ectopic pregnancies and miscarriages) approximately [redacted] weeks pregnant presents to the emergency department with painless vaginal bleeding which patient states began 30 minutes before arrival.  Patient states that blood is noted with wiping.        Past Medical History:  Diagnosis Date   Anemia    Back pain    Depression    Hypertrophic cardiomyopathy [facility])     Patient Active Problem List   Diagnosis Date Noted   Ectopic pregnancy, tubal 12/03/2018   Anemia, unspecified 03/02/2018    Past Surgical History:  Procedure Laterality Date   ABDOMINAL SURGERY  2004   gastric bypass   CESAREAN SECTION     INNER EAR SURGERY     LAPAROSCOPIC GASTRIC BYPASS  2004   tubal rupture      Prior to Admission medications   Not on File    Allergies Clindamycin/lincomycin  Family History  Problem Relation Age of Onset   Arthritis Mother    Other Mother        back pain w stimulator ( internal )   Kidney disease Father    Intestinal polyp Father    Behavior problems Sister     Social History Social History   Tobacco Use   Smoking status: Passive Smoke Exposure - Never Smoker   Smokeless tobacco: Never Used  Substance Use Topics   Alcohol use: Yes    Comment: socially beer   Drug use: No    Review of Systems Constitutional: No fever/chills Eyes: No visual changes. ENT: No sore throat. Cardiovascular: Denies chest pain. Respiratory: Denies shortness of breath. Gastrointestinal: No abdominal pain.  No nausea, no vomiting.  No diarrhea.  No constipation. Genitourinary: Negative for dysuria.  Positive for vaginal  bleeding Musculoskeletal: Negative for neck pain.  Negative for back pain. Integumentary: Negative for rash. Neurological: Negative for headaches, focal weakness or numbness.  ____________________________________________   PHYSICAL EXAM:  VITAL SIGNS: ED Triage Vitals  Enc Vitals Group     BP 12/02/18 2112 (!) 141/89     Pulse Rate 12/02/18 2112 71     Resp 12/02/18 2112 (!) 6     Temp 12/02/18 2112 98.2 F (36.8 C)     Temp Source 12/02/18 2112 Oral     SpO2 12/02/18 2112 99 %     Weight 12/02/18 2113 99.8 kg (220 lb)     Height 12/02/18 2113 1.626 m (5\' 4" )     Head Circumference --      Peak Flow --      Pain Score 12/02/18 2104 0     Pain Loc --      Pain Edu? --      Excl. in GC? --     Constitutional: Alert and oriented. Well appearing and in no acute distress. Eyes: Conjunctivae are normal.  Mouth/Throat: Mucous membranes are moist.  Oropharynx non-erythematous. Neck: No stridor.   Cardiovascular: Normal rate, regular rhythm. Good peripheral circulation. Grossly normal heart sounds. Respiratory: Normal respiratory effort.  No retractions. No audible wheezing. Gastrointestinal: Soft and nontender. No distention.  Musculoskeletal: No lower extremity tenderness nor edema. No gross deformities of extremities. Neurologic:  Normal speech and language. No gross focal neurologic deficits are appreciated.  Skin:  Skin is warm, dry and intact. No rash noted. Psychiatric: Mood and affect are normal. Speech and behavior are normal.  ____________________________________________   LABS (all labs ordered are listed, but only abnormal results are displayed)  Labs Reviewed  URINALYSIS, COMPLETE (UACMP) WITH MICROSCOPIC - Abnormal; Notable for the following components:      Result Value   Color, Urine YELLOW (*)    APPearance HAZY (*)    Hgb urine dipstick MODERATE (*)    All other components within normal limits  HCG, QUANTITATIVE, PREGNANCY - Abnormal; Notable for the  following components:   hCG, Beta Chain, Quant, S 4,802 (*)    All other components within normal limits  COMPREHENSIVE METABOLIC PANEL - Abnormal; Notable for the following components:   Glucose, Bld 101 (*)    All other components within normal limits  POCT PREGNANCY, URINE - Abnormal; Notable for the following components:   Preg Test, Ur POSITIVE (*)    All other components within normal limits  SARS CORONAVIRUS 2 (HOSPITAL ORDER, PERFORMED IN [facility])  CBC WITH DIFFERENTIAL/PLATELET  TYPE AND SCREEN     RADIOLOGY I, Fabens N Tyona Nilsen, personally viewed and evaluated these images (plain radiographs) as part of my medical decision making, as well as reviewing the written report by the radiologist.  ED MD interpretation: Live ectopic pregnancy within the right adnexa no free fluid noted on pelvic ultrasound per radiologist.  Official radiology report(s): [place] Ob Comp Less 14 Wks  Result Date: 12/02/2018 CLINICAL DATA:  Initial evaluation for acute vaginal bleeding, early pregnancy. EXAM: OBSTETRIC <14 WK US AND TRANSVAGINAL OB US TECHNIQUE: Both transabdominal and transvaginal ultrasound examinations were performed for complete evaluation of the gestation as well as the maternal uterus, adnexal regions, and pelvic cul-de-sac. Transvaginal technique was performed to assess early pregnancy. COMPARISON:  None available. FINDINGS: Intrauterine gestational sac: Negative. Yolk sac:  Present-right adnexa. Embryo:  Present-right adnexa. Cardiac Activity: Present-right adnexa. Heart Rate: 89 bpm CRL: 2.0 mm   5 w   5 d                  US EDC: N/A Subchorionic hemorrhage:  None visualized. Maternal uterus/adnexae: Left ovary normal in appearance. Right adnexal mass measuring 1.4 x 1.2 x 1.2 cm containing a yolk sac and embryo is seen. Positive fetal heart rate as above. Peripherally increased vascularity. This is positioned immediately adjacent to the right ovary, consistent with right  ectopic pregnancy, likely tubal in location. Adjacent right ovary enlarged with a large simple cyst measuring 4.5 x 3.5 x 4.4 cm. No free fluid within the pelvis. IMPRESSION: 1. Live ectopic pregnancy position within the right adnexa as above. No free fluid to suggest rupture. 2. Adjacent 4.5 cm simple right ovarian cyst. Critical Value/emergent results were called by telephone at the time of interpretation on 12/02/2018 at 11:29 pm to Dr. Dorothea GlassmanPAUL MALINDA , who verbally acknowledged these results. Electronically Signed   By: Rise MuBenjamin  McClintock M.D.   On: 12/02/2018 23:29   [place] Ob Transvaginal  Result Date: 12/02/2018 CLINICAL DATA:  Initial evaluation for acute vaginal bleeding, early pregnancy. EXAM: OBSTETRIC <14 WK US AND TRANSVAGINAL OB US TECHNIQUE: Both transabdominal and transvaginal ultrasound examinations were performed for complete evaluation of the gestation as well as the maternal uterus, adnexal regions, and pelvic cul-de-sac. Transvaginal technique was performed to assess early pregnancy. COMPARISON:  None available. FINDINGS: Intrauterine  gestational sac: Negative. Yolk sac:  Present-right adnexa. Embryo:  Present-right adnexa. Cardiac Activity: Present-right adnexa. Heart Rate: 89 bpm CRL: 2.0 mm   5 w   5 d                  [place] EDC: N/A Subchorionic hemorrhage:  None visualized. Maternal uterus/adnexae: Left ovary normal in appearance. Right adnexal mass measuring 1.4 x 1.2 x 1.2 cm containing a yolk sac and embryo is seen. Positive fetal heart rate as above. Peripherally increased vascularity. This is positioned immediately adjacent to the right ovary, consistent with right ectopic pregnancy, likely tubal in location. Adjacent right ovary enlarged with a large simple cyst measuring 4.5 x 3.5 x 4.4 cm. No free fluid within the pelvis. IMPRESSION: 1. Live ectopic pregnancy position within the right adnexa as above. No free fluid to suggest rupture. 2. Adjacent 4.5 cm simple right ovarian cyst.  Critical Value/emergent results were called by telephone at the time of interpretation on 12/02/2018 at 11:29 pm to Dr. Dorothea Glassman , who verbally acknowledged these results. Electronically Signed   By: Rise Mu M.D.   On: 12/02/2018 23:29    Procedures   ____________________________________________   INITIAL IMPRESSION / MDM / ASSESSMENT AND PLAN / ED COURSE  As part of my medical decision making, I reviewed the following data within the electronic MEDICAL RECORD NUMBER   43 year old female presented with above-stated history and physical exam secondary to vaginal bleeding with concern for possible ectopic pregnancy versus miscarriage.  OB ultrasound consistent with right ectopic pregnancy.  Patient discussed with Dr. Dalbert Garnet GYN who will admit the patient for further evaluation and management.  *Rebecca Lang was evaluated in Emergency Department on 12/03/2018 for the symptoms described in the history of present illness. She was evaluated in the context of the global COVID-19 pandemic, which necessitated consideration that the patient might be at risk for infection with the SARS-CoV-2 virus that causes COVID-19. Institutional protocols and algorithms that pertain to the evaluation of patients at risk for COVID-19 are in a state of rapid change based on information released by regulatory bodies including the CDC and federal and state organizations. These policies and algorithms were followed during the patient's care in the ED.  Some ED evaluations and interventions may be delayed as a result of limited staffing during the pandemic.*   ____________________________________________  FINAL CLINICAL IMPRESSION(S) / ED DIAGNOSES  Final diagnoses:  Right tubal pregnancy without intrauterine pregnancy     MEDICATIONS GIVEN DURING THIS VISIT:  Medications  lactated ringers infusion ( Intravenous New Bag/Given 12/03/18 0128)  ibuprofen (ADVIL) tablet 600 mg (has no administration in  time range)  zolpidem (AMBIEN) tablet 5 mg (has no administration in time range)  docusate sodium (COLACE) capsule 100 mg (has no administration in time range)  magnesium hydroxide (MILK OF MAGNESIA) suspension 30 mL (has no administration in time range)  bisacodyl (DULCOLAX) EC tablet 5 mg (has no administration in time range)  magnesium citrate solution 1 Bottle (has no administration in time range)  alum & mag hydroxide-simeth (MAALOX/MYLANTA) 200-200-20 MG/5ML suspension 30 mL (has no administration in time range)  ondansetron (ZOFRAN) tablet 4 mg (has no administration in time range)    Or  ondansetron (ZOFRAN) injection 4 mg (has no administration in time range)  lactated ringers infusion (has no administration in time range)  gabapentin (NEURONTIN) capsule 300 mg (has no administration in time range)  acetaminophen (TYLENOL) tablet 1,000 mg (has no administration in  time range)     ED Discharge Orders    None       Note:  This document was prepared using Dragon voice recognition software and may include unintentional dictation errors.   Darci Current, MD 12/03/18 [phone]

## 2018-12-03 ENCOUNTER — Encounter
Admission: EM | Disposition: A | Discharge status: Home / Self Care | Payer: Self-pay | Source: Home / Self Care | Attending: Emergency Medicine

## 2018-12-03 ENCOUNTER — Observation Stay: Payer: Medicaid Other | Admitting: Anesthesiology

## 2018-12-03 ENCOUNTER — Encounter: Payer: Self-pay | Admitting: *Deleted

## 2018-12-03 DIAGNOSIS — O00109 Unspecified tubal pregnancy without intrauterine pregnancy: Secondary | ICD-10-CM | POA: Diagnosis present

## 2018-12-03 HISTORY — PX: DIAGNOSTIC LAPAROSCOPY WITH REMOVAL OF ECTOPIC PREGNANCY: SHX6449

## 2018-12-03 LAB — TYPE AND SCREEN
ABO/RH(D): O POS
Antibody Screen: NEGATIVE
DAT, IgG: NEGATIVE
Weak D: POSITIVE

## 2018-12-03 LAB — SARS CORONAVIRUS 2 BY RT PCR (HOSPITAL ORDER, PERFORMED IN ~~LOC~~ HOSPITAL LAB): SARS Coronavirus 2: NEGATIVE

## 2018-12-03 SURGERY — LAPAROSCOPY, WITH ECTOPIC PREGNANCY SURGICAL TREATMENT
Anesthesia: General | Site: Abdomen | Laterality: Right

## 2018-12-03 MED ORDER — ROCURONIUM BROMIDE 100 MG/10ML IV SOLN
INTRAVENOUS | Status: DC | PRN
  Administered 2018-12-03: 5 mg via INTRAVENOUS
  Administered 2018-12-03: 20 mg via INTRAVENOUS
  Administered 2018-12-03: 50 mg via INTRAVENOUS

## 2018-12-03 MED ORDER — SUGAMMADEX SODIUM 200 MG/2ML IV SOLN
INTRAVENOUS | Status: DC | PRN
  Administered 2018-12-03: 199.6 mg via INTRAVENOUS

## 2018-12-03 MED ORDER — LIDOCAINE HCL 1 % IJ SOLN
INTRAMUSCULAR | Status: DC | PRN
Start: 2018-12-03 — End: 2018-12-03
  Administered 2018-12-03: 10 mL

## 2018-12-03 MED ORDER — ACETAMINOPHEN 500 MG PO TABS: 1000.0000 mg | ORAL_TABLET | Freq: Four times a day (QID) | ORAL | 0 refills | Status: AC

## 2018-12-03 MED ORDER — LIDOCAINE HCL (CARDIAC) PF 100 MG/5ML IV SOSY
PREFILLED_SYRINGE | INTRAVENOUS | Status: DC | PRN
  Administered 2018-12-03: 100 mg via INTRAVENOUS

## 2018-12-03 MED ORDER — PROPOFOL 10 MG/ML IV BOLUS
INTRAVENOUS | Status: DC | PRN
  Administered 2018-12-03: 180 mg via INTRAVENOUS

## 2018-12-03 MED ORDER — OXYCODONE HCL 5 MG PO CAPS: 5.0000 mg | ORAL_CAPSULE | Freq: Four times a day (QID) | ORAL | 0 refills | Status: DC | PRN

## 2018-12-03 MED ORDER — FENTANYL CITRATE (PF) 100 MCG/2ML IJ SOLN
INTRAMUSCULAR | Status: AC
  Filled 2018-12-03: qty 2

## 2018-12-03 MED ORDER — ONDANSETRON HCL 4 MG/2ML IJ SOLN
INTRAMUSCULAR | Status: DC | PRN
  Administered 2018-12-03: 4 mg via INTRAVENOUS

## 2018-12-03 MED ORDER — ZOLPIDEM TARTRATE 5 MG PO TABS: 5.0000 mg | ORAL_TABLET | Freq: Every evening | ORAL | Status: DC | PRN

## 2018-12-03 MED ORDER — IBUPROFEN 800 MG PO TABS: 800.0000 mg | ORAL_TABLET | Freq: Three times a day (TID) | ORAL | 1 refills | Status: AC

## 2018-12-03 MED ORDER — ONDANSETRON HCL 4 MG PO TABS: 4.0000 mg | ORAL_TABLET | Freq: Four times a day (QID) | ORAL | Status: DC | PRN

## 2018-12-03 MED ORDER — DEXAMETHASONE SODIUM PHOSPHATE 10 MG/ML IJ SOLN
INTRAMUSCULAR | Status: DC | PRN
  Administered 2018-12-03: 10 mg via INTRAVENOUS

## 2018-12-03 MED ORDER — FENTANYL CITRATE (PF) 100 MCG/2ML IJ SOLN
INTRAMUSCULAR | Status: DC | PRN
  Administered 2018-12-03 (×6): 50 ug via INTRAVENOUS

## 2018-12-03 MED ORDER — SUGAMMADEX SODIUM 200 MG/2ML IV SOLN
INTRAVENOUS | Status: AC
  Filled 2018-12-03: qty 2

## 2018-12-03 MED ORDER — OXYCODONE HCL 5 MG PO TABS: 5.0000 mg | ORAL_TABLET | Freq: Once | ORAL | Status: DC | PRN

## 2018-12-03 MED ORDER — OXYCODONE HCL 5 MG/5ML PO SOLN: 5.0000 mg | Freq: Once | ORAL | Status: DC | PRN

## 2018-12-03 MED ORDER — MAGNESIUM HYDROXIDE 400 MG/5ML PO SUSP: 30.0000 mL | Freq: Every day | ORAL | Status: DC | PRN

## 2018-12-03 MED ORDER — FENTANYL CITRATE (PF) 100 MCG/2ML IJ SOLN
25.0000 ug | INTRAMUSCULAR | Status: DC | PRN
  Administered 2018-12-03: 50 ug via INTRAVENOUS
  Administered 2018-12-03 (×2): 25 ug via INTRAVENOUS

## 2018-12-03 MED ORDER — ONDANSETRON HCL 4 MG/2ML IJ SOLN: 4.0000 mg | Freq: Four times a day (QID) | INTRAMUSCULAR | Status: DC | PRN

## 2018-12-03 MED ORDER — PROPOFOL 10 MG/ML IV BOLUS
INTRAVENOUS | Status: AC
  Filled 2018-12-03: qty 20

## 2018-12-03 MED ORDER — DOCUSATE SODIUM 100 MG PO CAPS: 100.0000 mg | ORAL_CAPSULE | Freq: Two times a day (BID) | ORAL | 0 refills | Status: DC

## 2018-12-03 MED ORDER — ACETAMINOPHEN 500 MG PO TABS
1000.0000 mg | ORAL_TABLET | ORAL | Status: AC
Start: 2018-12-03 — End: 2018-12-03
  Administered 2018-12-03: 08:00:00 1000 mg via ORAL
  Filled 2018-12-03: qty 2

## 2018-12-03 MED ORDER — LIDOCAINE HCL (PF) 1 % IJ SOLN
INTRAMUSCULAR | Status: AC
  Filled 2018-12-03: qty 30

## 2018-12-03 MED ORDER — LACTATED RINGERS IV SOLN: INTRAVENOUS | Status: DC

## 2018-12-03 MED ORDER — KETOROLAC TROMETHAMINE 30 MG/ML IJ SOLN
INTRAMUSCULAR | Status: DC | PRN
  Administered 2018-12-03: 30 mg via INTRAVENOUS

## 2018-12-03 MED ORDER — SUCCINYLCHOLINE CHLORIDE 20 MG/ML IJ SOLN
INTRAMUSCULAR | Status: DC | PRN
  Administered 2018-12-03: 200 mg via INTRAVENOUS

## 2018-12-03 MED ORDER — ONDANSETRON HCL 4 MG/2ML IJ SOLN
INTRAMUSCULAR | Status: AC
  Filled 2018-12-03: qty 2

## 2018-12-03 MED ORDER — DOCUSATE SODIUM 100 MG PO CAPS
100.0000 mg | ORAL_CAPSULE | Freq: Two times a day (BID) | ORAL | Status: DC
  Administered 2018-12-03 – 2018-12-04 (×2): 100 mg via ORAL
  Filled 2018-12-03 (×2): qty 1

## 2018-12-03 MED ORDER — LIDOCAINE HCL (PF) 2 % IJ SOLN
INTRAMUSCULAR | Status: AC
  Filled 2018-12-03: qty 10

## 2018-12-03 MED ORDER — MIDAZOLAM HCL 2 MG/2ML IJ SOLN
INTRAMUSCULAR | Status: DC | PRN
  Administered 2018-12-03: 2 mg via INTRAVENOUS

## 2018-12-03 MED ORDER — MENTHOL 3 MG MT LOZG
1.0000 | LOZENGE | OROMUCOSAL | Status: DC | PRN
  Filled 2018-12-03: qty 9

## 2018-12-03 MED ORDER — BISACODYL 5 MG PO TBEC: 5.0000 mg | DELAYED_RELEASE_TABLET | Freq: Every day | ORAL | Status: DC | PRN

## 2018-12-03 MED ORDER — FENTANYL CITRATE (PF) 100 MCG/2ML IJ SOLN
INTRAMUSCULAR | Status: AC
  Administered 2018-12-03: 11:00:00 25 ug via INTRAVENOUS
  Filled 2018-12-03: qty 2

## 2018-12-03 MED ORDER — KETOROLAC TROMETHAMINE 30 MG/ML IJ SOLN
INTRAMUSCULAR | Status: AC
  Filled 2018-12-03: qty 1

## 2018-12-03 MED ORDER — HYDROMORPHONE HCL 1 MG/ML IJ SOLN
0.5000 mg | INTRAMUSCULAR | Status: DC | PRN
  Administered 2018-12-03: 13:00:00 1 mg via INTRAVENOUS
  Filled 2018-12-03: qty 1

## 2018-12-03 MED ORDER — MAGNESIUM CITRATE PO SOLN
1.0000 | Freq: Once | ORAL | Status: DC | PRN
  Filled 2018-12-03: qty 296

## 2018-12-03 MED ORDER — ALUM & MAG HYDROXIDE-SIMETH 200-200-20 MG/5ML PO SUSP: 30.0000 mL | ORAL | Status: DC | PRN

## 2018-12-03 MED ORDER — DEXAMETHASONE SODIUM PHOSPHATE 10 MG/ML IJ SOLN
INTRAMUSCULAR | Status: AC
  Filled 2018-12-03: qty 1

## 2018-12-03 MED ORDER — OXYCODONE-ACETAMINOPHEN 5-325 MG PO TABS
1.0000 | ORAL_TABLET | ORAL | Status: DC | PRN
  Administered 2018-12-03: 21:00:00 2 via ORAL
  Administered 2018-12-03 – 2018-12-04 (×3): 1 via ORAL
  Filled 2018-12-03 (×3): qty 1
  Filled 2018-12-03: qty 2

## 2018-12-03 MED ORDER — LACTATED RINGERS IV SOLN
INTRAVENOUS | Status: DC
  Administered 2018-12-03 (×2): via INTRAVENOUS

## 2018-12-03 MED ORDER — GABAPENTIN 800 MG PO TABS: 800.0000 mg | ORAL_TABLET | Freq: Every day | ORAL | 0 refills | Status: DC

## 2018-12-03 MED ORDER — IBUPROFEN 600 MG PO TABS
600.0000 mg | ORAL_TABLET | Freq: Four times a day (QID) | ORAL | Status: DC | PRN
  Administered 2018-12-03 – 2018-12-04 (×3): 600 mg via ORAL
  Filled 2018-12-03 (×3): qty 1

## 2018-12-03 MED ORDER — MIDAZOLAM HCL 2 MG/2ML IJ SOLN
INTRAMUSCULAR | Status: AC
  Filled 2018-12-03: qty 2

## 2018-12-03 MED ORDER — GABAPENTIN 300 MG PO CAPS
300.0000 mg | ORAL_CAPSULE | ORAL | Status: AC
Start: 2018-12-03 — End: 2018-12-03
  Administered 2018-12-03: 300 mg via ORAL
  Filled 2018-12-03: qty 1

## 2018-12-03 SURGICAL SUPPLY — 48 items
ANCHOR TIS RET SYS 235ML (MISCELLANEOUS) ×3 IMPLANT
BACTOSHIELD CHG 4% 4OZ (MISCELLANEOUS) ×2
BAG URINE DRAINAGE (UROLOGICAL SUPPLIES) ×3 IMPLANT
BLADE SURG SZ11 CARB STEEL (BLADE) ×3 IMPLANT
CANISTER SUCT 1200ML W/VALVE (MISCELLANEOUS) ×3 IMPLANT
CATH FOLEY 2WAY  5CC 16FR (CATHETERS) ×2
CATH URTH 16FR FL 2W BLN LF (CATHETERS) ×1 IMPLANT
CHLORAPREP W/TINT 26 (MISCELLANEOUS) ×3 IMPLANT
CLOSURE WOUND 1/2 X4 (GAUZE/BANDAGES/DRESSINGS) ×1
COVER WAND RF STERILE (DRAPES) ×3 IMPLANT
CUP MEDICINE 2OZ PLAST GRAD ST (MISCELLANEOUS) ×3 IMPLANT
DERMABOND ADVANCED (GAUZE/BANDAGES/DRESSINGS) ×2
DERMABOND ADVANCED .7 DNX12 (GAUZE/BANDAGES/DRESSINGS) ×1 IMPLANT
DEVICE SUTURE ENDOST 10MM (ENDOMECHANICALS) ×3 IMPLANT
DEVICE TROCAR PUNCTURE CLOSURE (ENDOMECHANICALS) IMPLANT
DRAPE LEGGINS SURG 28X43 STRL (DRAPES) ×3 IMPLANT
DRAPE UNDER BUTTOCK W/FLU (DRAPES) ×3 IMPLANT
GLOVE PI ORTHOPRO 6.5 (GLOVE) ×2
GLOVE PI ORTHOPRO STRL 6.5 (GLOVE) ×1 IMPLANT
GLOVE SURG SYN 6.5 ES PF (GLOVE) ×9 IMPLANT
GOWN STRL REUS W/ TWL LRG LVL3 (GOWN DISPOSABLE) ×2 IMPLANT
GOWN STRL REUS W/TWL LRG LVL3 (GOWN DISPOSABLE) ×4
GRASPER SUT TROCAR 14GX15 (MISCELLANEOUS) IMPLANT
IRRIGATION STRYKERFLOW (MISCELLANEOUS) ×1 IMPLANT
IRRIGATOR STRYKERFLOW (MISCELLANEOUS) ×3
IV LACTATED RINGERS 1000ML (IV SOLUTION) ×3 IMPLANT
IV NS 1000ML (IV SOLUTION) ×2
IV NS 1000ML BAXH (IV SOLUTION) ×1 IMPLANT
KIT PINK PAD W/HEAD ARE REST (MISCELLANEOUS) ×3
KIT PINK PAD W/HEAD ARM REST (MISCELLANEOUS) ×1 IMPLANT
KIT TURNOVER CYSTO (KITS) ×3 IMPLANT
LIGASURE VESSEL 5MM BLUNT TIP (ELECTROSURGICAL) ×3 IMPLANT
MANIPULATOR UTERINE 4.5 ZUMI (MISCELLANEOUS) IMPLANT
NS IRRIG 500ML POUR BTL (IV SOLUTION) ×3 IMPLANT
PACK LAP CHOLECYSTECTOMY (MISCELLANEOUS) ×3 IMPLANT
PAD OB MATERNITY 4.3X12.25 (PERSONAL CARE ITEMS) ×3 IMPLANT
PAD PREP 24X41 OB/GYN DISP (PERSONAL CARE ITEMS) ×3 IMPLANT
SCISSORS METZENBAUM CVD 33 (INSTRUMENTS) IMPLANT
SCRUB CHG 4% DYNA-HEX 4OZ (MISCELLANEOUS) ×1 IMPLANT
SET TUBE SMOKE EVAC HIGH FLOW (TUBING) ×3 IMPLANT
SLEEVE ENDOPATH XCEL 5M (ENDOMECHANICALS) ×6 IMPLANT
STRIP CLOSURE SKIN 1/2X4 (GAUZE/BANDAGES/DRESSINGS) ×2 IMPLANT
SUT ENDO VLOC 180-0-8IN (SUTURE) ×3 IMPLANT
SUT MNCRL AB 4-0 PS2 18 (SUTURE) ×3 IMPLANT
SUT VIC AB 2-0 UR6 27 (SUTURE) ×3 IMPLANT
SYR 10ML LL (SYRINGE) ×3 IMPLANT
TROCAR ENDO BLADELESS 11MM (ENDOMECHANICALS) ×3 IMPLANT
TROCAR XCEL NON-BLD 5MMX100MML (ENDOMECHANICALS) ×3 IMPLANT

## 2018-12-03 NOTE — Progress Notes (Signed)
Heart rate in low 50's  Blood pressure good dr piscitello had no new orders at this time

## 2018-12-03 NOTE — Anesthesia Procedure Notes (Signed)
Procedure Name: Intubation Performed by: Demetrius Charity, CRNA Pre-anesthesia Checklist: Patient identified, Patient being monitored, Timeout performed, Emergency Drugs available and Suction available Patient Re-evaluated:Patient Re-evaluated prior to induction Oxygen Delivery Method: Circle system utilized Preoxygenation: Pre-oxygenation with 100% oxygen Induction Type: IV induction Ventilation: Mask ventilation without difficulty Laryngoscope Size: Mac and 3 Grade View: Grade I Tube type: Oral Tube size: 7.0 mm Number of attempts: 1 Airway Equipment and Method: Stylet Placement Confirmation: ETT inserted through vocal cords under direct vision,  positive ETCO2 and breath sounds checked- equal and bilateral Secured at: 21 cm Tube secured with: Tape Dental Injury: Teeth and Oropharynx as per pre-operative assessment

## 2018-12-03 NOTE — Anesthesia Postprocedure Evaluation (Signed)
Anesthesia Post Note  Patient: Rebecca Lang  Procedure(s) Performed: DIAGNOSTIC LAPAROSCOPY WITH REMOVAL OF RIGHT ECTOPIC PREGNANCY AND RIGHT OVARY (Right Abdomen)  Patient location during evaluation: PACU Anesthesia Type: General Level of consciousness: awake and alert Pain management: pain level controlled Vital Signs Assessment: post-procedure vital signs reviewed and stable Respiratory status: spontaneous breathing, nonlabored ventilation, respiratory function stable and patient connected to nasal cannula oxygen Cardiovascular status: blood pressure returned to baseline and stable Postop Assessment: no apparent nausea or vomiting Anesthetic complications: no     Last Vitals:  Vitals:   12/03/18 1048 12/03/18 1053  BP:  120/63  Pulse: (!) 54 (!) 52  Resp: 16 10  Temp:    SpO2: 100% 100%    Last Pain:  Vitals:   12/03/18 1053  TempSrc:   PainSc: 7                  Rebecca Lang

## 2018-12-03 NOTE — Anesthesia Preprocedure Evaluation (Signed)
Anesthesia Evaluation  Patient identified by MRN, date of birth, ID band Patient awake    Reviewed: Allergy & Precautions, H&P , NPO status , Patient's Chart, lab work & pertinent test results  History of Anesthesia Complications Negative for: history of anesthetic complications  Airway Mallampati: II  TM Distance: >3 FB Neck ROM: full    Dental  (+) Chipped   Pulmonary neg pulmonary ROS, neg shortness of breath,           Cardiovascular Exercise Tolerance: Good (-) angina(-) Past MI and (-) DOE negative cardio ROS       Neuro/Psych PSYCHIATRIC DISORDERS negative neurological ROS  negative psych ROS   GI/Hepatic negative GI ROS, Neg liver ROS, neg GERD  ,  Endo/Other  negative endocrine ROS  Renal/GU      Musculoskeletal   Abdominal   Peds  Hematology negative hematology ROS (+)   Anesthesia Other Findings Past Medical History: No date: Anemia No date: Back pain No date: Depression No date: Hypertrophic cardiomyopathy (HCC)  (patient reports that the last cardiologist that she talked to told her that her cardiac function had resolved, she is also asymptomatic from a cardiac standpoint now)   Past Surgical History: 2004: ABDOMINAL SURGERY     Comment:  gastric bypass No date: CESAREAN SECTION No date: INNER EAR SURGERY 2004: LAPAROSCOPIC GASTRIC BYPASS No date: tubal rupture  BMI    Body Mass Index:  37.76 kg/m      Reproductive/Obstetrics negative OB ROS                             Anesthesia Physical Anesthesia Plan  ASA: III  Anesthesia Plan: General ETT   Post-op Pain Management:    Induction: Intravenous  PONV Risk Score and Plan: Ondansetron, Dexamethasone, Midazolam and Treatment may vary due to age or medical condition  Airway Management Planned: Oral ETT  Additional Equipment:   Intra-op Plan:   Post-operative Plan: Extubation in OR  Informed  Consent: I have reviewed the patients History and Physical, chart, labs and discussed the procedure including the risks, benefits and alternatives for the proposed anesthesia with the patient or authorized representative who has indicated his/her understanding and acceptance.     Dental Advisory Given  Plan Discussed with: Anesthesiologist, CRNA and Surgeon  Anesthesia Plan Comments: (Patient consented for risks of anesthesia including but not limited to:  - adverse reactions to medications - damage to teeth, lips or other oral mucosa - sore throat or hoarseness - Damage to heart, brain, lungs or loss of life  Patient voiced understanding.)        Anesthesia Quick Evaluation

## 2018-12-03 NOTE — Discharge Instructions (Signed)
Laparoscopic Tubal Removal for Ectopic Pregnancy Discharge Instructions  Laparoscopic tubal removal is a procedure that removes the fallopian tube containing the ectopic pregnancy.  For the next three days, take ibuprofen and acetaminophen on a schedule, every 8 hours. You can take them together or you can intersperse them, and take one every four hours. I also gave you gabapentin for nighttime, to help you sleep and also to control pain. Take gabapentin medicines at night for at least the next 3 nights. You also have a narcotic, oxycodone, to take as needed if the above medicines don't help.  Postop constipation is a major cause of pain. Stay well hydrated, walk as you tolerate, and take over the counter senna as well as stool softeners if you need them.  RISKS AND COMPLICATIONS   Infection.  Bleeding.  Injury to surrounding organs.  Anesthetic side effects.  Failure of the procedure.  Risks of future ectopic pregnancy on the other side PROCEDURE   You may be given a medicine to help you relax (sedative) before the procedure. You will be given a medicine to make you sleep (general anesthetic) during the procedure.  A tube will be put down your throat to help your breath while under general anesthesia.  Two small cuts (incisions) are made in the lower abdominal area and one incision is made near the belly button.  Your abdominal area will be inflated with a safe gas (carbon dioxide). This helps give the surgeon room to operate, visualize, and helps the surgeon avoid other organs.  A thin, lighted tube (laparoscope) with a camera attached is inserted into your abdomen through the incision near the belly button. Other small instruments are also inserted through the other abdominal incisions.  The fallopian tube is located and are removed.  After the fallopian tube is removed, the gas is released from the abdomen.  The incisions will be closed with stitches (sutures), and  Dermabond. A bandage may be placed over the incisions. AFTER THE PROCEDURE   You will also have some mild abdominal discomfort for 3-7 days. You will be given pain medicine to ease any discomfort.  As long as there are no problems, you may be allowed to go home. Someone will need to drive you home and be with you for at least 24 hours once home.  You may have some mild discomfort in the throat. This is from the tube placed in your throat while you were sleeping.  You may experience discomfort in the shoulder area from some trapped air between the liver and diaphragm. This sensation is normal and will slowly go away on its own. HOME CARE INSTRUCTIONS   Take all medicines as directed.  Only take over-the-counter or prescription medicines for pain, discomfort, or fever as directed by your caregiver.  Resume daily activities as directed.  Showers are preferred over baths.  You may resume sexual activities in 1 week or as directed.  Do not drive while taking narcotics. SEEK MEDICAL CARE IF: .  There is increasing abdominal pain.  You feel lightheaded or faint.  You have the chills.  You have an oral temperature above 102 F (38.9 C).  There is pus-like (purulent) drainage from any of the wounds.  You are unable to pass gas or have a bowel movement.  You feel sick to your stomach (nauseous) or throw up (vomit). MAKE SURE YOU:   Understand these instructions.  Will watch your condition.  Will get help right away  if you are not doing well or get worse.  ExitCare Patient Information 2013 [place], [place].    Here is a helpful article from the website http://mitchell.org/, regarding constipation  Here are reasons why constipation occurs after surgery: 1) During the operation and in the recovery room, most people are given opioid pain medication, primarily through an IV, to treat moderate or severe pain. Intravenous opioids include morphine, Dilaudid and fentanyl. After surgery,  patients are often prescribed opioid pain medication to take by mouth at home, including codeine, Vicodin, Norco, and Percocet. All of these medications cause constipation by slowing down the movement of your intestine. 2) Changes in your diet before surgery can be another culprit. It is common to get specific instructions to change how you normally eat or drink before your surgery, like only having liquids the day before or not having anything to eat or drink after midnight the night before surgery. For this reason, temporary dehydration may occur. This, along with not eating or only having liquids, means that you are getting less fiber than usual. Both these factors contribute to constipation. 3) Changes in your diet after surgery can also contribute to the problem. Although many people dont have dietary restrictions after operations, being under anesthesia can make you lose your appetite for several hours and maybe even days. Some people can even have nausea or vomiting. Not eating or drinking normally means that you are not getting enough fiber and you can get dehydrated, both leading to constipation. 4) Lying in a bed more than usual--which happens before, during and after surgery--combined with the medications and diet changes, all work together to slow down your colon and make your poop turn to rock.  No one likes to be constipated.  Lets face it, its not a pleasant feeling when you dont poop for days, then strain on the toilet to finally pass something large enough to cause damage. An ounce of prevention is worth a pound of cure, so: 1. Assume you will be constipated. 2. Plan and prepare accordingly. Post-surgery is one of those unique situations where the temporary use of laxatives can make a world of difference. Always consult with your doctor, and recognize that if you wait several days after surgery to take a laxative, the constipation might be too severe for these over-the-counter  options. It is always important to discuss all medications you plan on taking with your doctor. Ask your doctor if you can start the laxative immediately after surgery. *  Here are go-to post-surgery laxatives: Senna: Senna is an herb that acts as a stimulant laxative, meaning it increases the activity of the intestine to cause you to have a bowel movement. It comes in many forms, but senna pills are easy to take and are sold over the counter at almost all pharmacies. Since opioid pain medications slow down the activity of the intestine, it makes sense to take a medication to help reverse that side effect. Long-term use of a stimulant laxative is not a good idea since it can make your colon lazy and not function properly; however, temporary use immediately after surgery is acceptable. In general, if you are able to eat a normal diet, taking senna soon after surgery works the best. Senna usually works within hours to produce a bowel movement, but this is less predictable when you are taking different medications after surgery. Try not to wait several days to start taking senna, as often it is too late by then. Just like with all  medications or supplements, check with your doctor before starting new treatment.   Magnesium: Magnesium is an important mineral that our body needs. We get magnesium from some foods that we eat, especially foods that are high in fiber such as broccoli, almonds and whole grains. There are also magnesium-based medications used to treat constipation including milk of magnesia (magnesium hydroxide), magnesium citrate and magnesium oxide. They work by drawing water into the intestine, putting it into the class of osmotic laxatives. Magnesium products in low doses appear to be safe, but if taken in very large doses, can lead to problems such as irregular heartbeat, low blood pressure and even death. It can also affect other medications you might be taking, therefore it is important to  discuss using magnesium with your physician and pharmacist before initiating therapy. Most over-the-counter magnesium laxatives work very well to help with the constipation related to surgery, but sometimes they work too well and lead to diarrhea. Make sure you are somewhere with easy access to a bathroom, just in case.   Bisacodyl: Bisacodyl (generic name) is sold under brand names such as Dulcolax. Much like senna, it is a stimulant laxative, meaning it makes your intestines move more quickly to push out the stool. This is another good choice to start taking as soon as your doctor says you can take a laxative after surgery. It comes in pill form and as a suppository, which is a good choice for people who cannot or are not allowed to swallow pills. Studies have shown that it works as a laxative, but like most of these medications, you should use this on a short-term basis only.   Enema: Enemas strike fear in many people, but FEAR NOT! Its nowhere near as big a deal as you may think. An enema is just a way to get some liquid into your rectum by placing a specially designed device through your anus. If you have never done one, it might seem like a painful, unpleasant, uncomfortable, complicated and lengthy procedure. But in reality, its simple, takes just a few seconds and is highly effective. The small ready-made bottles you buy at the pharmacy are much easier than the hose/large rubber container type. Those recommended positions illustrated in some instructions are generally not necessary to place the enema. Its very similar to the insertion of a tampon, requiring a slight squat. Some extra lubrication on the enemas tip (or on your anus) will make it a breeze. In certain cases, there is no substitute for a good enema. For example, if someone has not pooped for a few days, the beginning of the poop waiting to come out can become rock hard. Passing that hard stool can lead to much pain and problems like  anal fissures. Inserting a little liquid to break up the rock-hard stool will help make its passage much easier. Enemas come with different liquids. Most come with saline, but there are also mineral oil options. You can also use warm water in the reusable enema containers. They all work. But since saline can sometimes be irritating, so try a mineral oil or water enema instead.  Here are commonly recommended constipation medications that do not work well for post-surgery constipation: Docusate: Docusate (generic name) most commonly referred to as Colace (brand name) is not really a laxative, but is classified as a stool softener. Although this medication is commonly prescribed, it is not recommended for several reasons: 1) there is no good medical evidence that it works 2) even if  it has an effect, which is very questionable, it is minimal and cannot combat the intestinal slowing caused by the opioid medications. Skip docusate to save money and space in your pillbox for something more effective.  PEG: Miralax (brand name) is basically a chemical called polyethylene glycol (PEG) and it has gained tremendous popularity as a laxative. This product is an osmotic laxative meaning it works by pulling water into the stool, making it softer. This is very similar to the action of natural fiber in foods and supplements. Therefore, the effect seen by this medication is not immediate, causing a bowel movement in a day or more. Is this medication strong enough to battle the constipation related to having an operation? Maybe for some people not prone to constipation. But for most people, other laxatives are better to prevent constipation after surgery.  Call your doctor for increased pain or vaginal bleeding, temperature above 100.4, depression, or concerns.  No strenuous activity or heavy lifting for 6 weeks.  No tub baths- showers only.  No intercourse, tampons, or douching for 6 weeks.  No driving for 2 weeks or while  taking Percocet.  Keep incisions clean and dry.  Call your doctor for incision concerns including redness, swelling, bleeding or drainage, or if begins to come apart.

## 2018-12-03 NOTE — Discharge Summary (Signed)
Day of Surgery       Procedure(s): DIAGNOSTIC LAPAROSCOPY WITH REMOVAL OF RIGHT ECTOPIC PREGNANCY AND RIGHT OVARY (Right) Subjective: The patient is doing well.  No nausea or vomiting. Pain is adequately controlled with po meds.  She required overnight pain control at LLQ port site.  Objective: Vital signs in last 24 hours: Temp:  [97.9 F (36.6 C)-99.3 F (37.4 C)] 98.2 F (36.8 C) (05/22 1921) Pulse Rate:  [47-102] 102 (05/22 1921) Resp:  [10-20] 20 (05/22 1921) BP: (108-147)/(54-81) 119/72 (05/22 1921) SpO2:  [94 %-100 %] 99 % (05/22 1921)  Intake/Output  Intake/Output Summary (Last 24 hours) at 12/03/2018 2146 Last data filed at 12/03/2018 1900 Gross per 24 hour  Intake 2201.71 ml  Output 1720 ml  Net 481.71 ml    Physical Exam:  General: Alert and oriented. CV: RRR Lungs: Clear bilaterally. GI: Soft, Nondistended. Incisions: Clean and dry. Extremities: Nontender, no erythema, no edema.  Lab Results: Recent Labs    12/02/18 2114  HGB 12.0  HCT 37.3  WBC 10.0  PLT 359                 Results for orders placed or performed during the hospital encounter of 12/02/18 (from the past 24 hour(s))  Type and screen [facility]     Status: None   Collection Time: 12/02/18 11:51 PM  Result Value Ref Range   ABO/RH(D) O POS    Antibody Screen NEG    Sample Expiration 12/05/2018,2359    Weak D POS    DAT, IgG      NEG Performed at [facility], [address], [place], [place] [address]   SARS Coronavirus 2 (CEPHEID - Performed in [facility] hospital lab), Hosp Order     Status: None   Collection Time: 12/03/18  1:02 AM  Result Value Ref Range   SARS Coronavirus 2 NEGATIVE NEGATIVE    Assessment/Plan: Day of Surgery       Procedure(s): DIAGNOSTIC LAPAROSCOPY WITH REMOVAL OF RIGHT ECTOPIC PREGNANCY AND RIGHT OVARY (Right)  1) Ambulate, Incentive spirometry 2) Advance diet as tolerated 3) Discharge home today  anticipated   Christeen Douglas, MD   LOS: 0 days   Jennell Corner MD 12/04/2018

## 2018-12-03 NOTE — Op Note (Signed)
Rebecca Lang PROCEDURE DATE: 12/02/2018 - 12/03/2018  PREOPERATIVE DIAGNOSIS: Ruptured ectopic pregnancy POSTOPERATIVE DIAGNOSIS: Ruptured right fallopian tube ectopic pregnancy PROCEDURE: Laparoscopic right salpingectomy and removal of ectopic pregnancy SURGEON:  Dr. Christeen Douglas ANESTHESIOLOGIST: Piscitello, Cleda Mccreedy, MD Anesthesiologist: Randa Ngo Cleda Mccreedy, MD CRNA: Malva Cogan, CRNA  INDICATIONS: 43 y.o. [phone] at [redacted]w[redacted]d here with a non-ruptured right ectopic pregnancy in a previously damaged tube.  Please refer to preoperative notes for more details. Patient was counseled regarding need for laparoscopic salpingectomy. Risks of surgery including bleeding which may require transfusion or reoperation, infection, injury to bowel or other surrounding organs, need for additional procedures including laparotomy and other postoperative/anesthesia complications were explained to patient.  Written informed consent was obtained.  FINDINGS: .  Dilated right fallopian tube containing ectopic gestation, attached to right ovary. Small normal appearing uterus, normal left fallopian tube, left ovary. Small bleeding sound puncture at the fundus that was hemostatic with cautery.  ANESTHESIA: General INTRAVENOUS FLUIDS: 700 ml ESTIMATED BLOOD LOSS: 61ml URINE OUTPUT: 600 ml SPECIMENS: Right fallopian tube and ovary containing ectopic gestation COMPLICATIONS: None immediate  PROCEDURE IN DETAIL:  The patient was taken to the operating room where general anesthesia was administered and was found to be adequate.  She was placed in the dorsal lithotomy position, and was prepped and draped in a sterile manner.  Attention was turned to the abdomen where an umbilical incision was made with the scalpel.  The Optiview 5-mm trocar and sleeve were then advanced without difficulty. Survey of the entry site was noted to be without damage. The abdomen was then insufflated with carbon dioxide gas and adequate  pneumoperitoneum was obtained.  A survey of the patient's pelvis and abdomen revealed the findings above.  A 11-mm port was placed on the left under direct visualization, and a 5-mm right lower quadrant port was then placed under direct visualization.     A survey of the pelvis noted ureter peristalsis deep to the ovarian ligament and not in the surgical field. Attention was then turned to the left fallopian tube and ovary. The left tube was separated from the underlying mesosalpinx and uterine attachment using the Ligasure instrument.  The right ovarian vessels were ligated, with care to keep the ureter out of the surgical field. Good hemostasis was noted.  The specimen was placed in an EndoCatch bag and removed from the abdomen intact.  The abdomen was desufflated, and all instruments were removed.  The fascial incision of the 10-mm site was reapproximated with a 0 Vicryl figure-of-eight stitch; and all skin incisions were closed with 4-0 Vicryl and Dermabond. The patient tolerated the procedure well.  All instruments, needles, and sponge counts were correct x 2. The patient was taken to the recovery room in stable condition.   Cline Cools, MD, MPH

## 2018-12-03 NOTE — Progress Notes (Signed)
Patient transferred to OR by orderly via bed at 0844 on 12/03/18. Reynold Bowen, RN 12/03/2018 8:47 AM

## 2018-12-03 NOTE — Anesthesia Post-op Follow-up Note (Signed)
Anesthesia QCDR form completed.        

## 2018-12-03 NOTE — Progress Notes (Signed)
Patient returned to room via bed by orderly at 1135 on 12/03/18. Reynold Bowen, RN 12/03/2018

## 2018-12-03 NOTE — H&P (Signed)
Consult History and Physical   SERVICE: Gynecology  Patient Name: Rebecca CaulABATHA L Harold Patient MRN:   [id number]  CC: Vaginal bleeding in pregnancy  HPI: Rebecca Lang is a 43 y.o. [id number] at 5 wks and 5 days by ultrasound today with vaginal bleeding, no abdominal pain and a hx of right ruptured ectopic pregnancy 337yrs ago. She has since had a successful pregnancy and at least one miscarriage since. She is not on contraception and is not interested in permanent sterilization.  Hx of endometriosis, anemia managed by hematology, and prior pelvic surgeries including cesarean section, gastric bypass with a current Body mass index is 37.76 kg/m. and laparoscopic salpingostomy I presume, as she had a ruptured ectopic but no tubal removal.  She is not on medications and has a negative review of systems except for pelvic pain with vaginal ultrasound but not after.  Ultrasound found a right adnexal pregnancy with yolk sac and embryo with heartbeat visualized. No free fluid. No hemoperitoneum by imaging or ROS.  Review of Systems: positives in bold GEN:   fevers, chills, weight changes, appetite changes, fatigue, night sweats HEENT:  HA, vision changes, hearing loss, congestion, rhinorrhea, sinus pressure, dysphagia CV:   CP, palpitations PULM:  SOB, cough GI:  abd pain, N/V/D/C GU:  dysuria, urgency, frequency MSK:  arthralgias, myalgias, back pain, swelling SKIN:  rashes, color changes, pallor NEURO:  numbness, weakness, tingling, seizures, dizziness, tremors PSYCH:  depression, anxiety, behavioral problems, confusion  HEME/LYMPH:  easy bruising or bleeding ENDO:  heat/cold intolerance  Past Obstetrical History: OB History    Gravida  2   Para      Term      Preterm      AB      Living        SAB      TAB      Ectopic      Multiple      Live Births              Past Gynecologic History: Patient's last menstrual period was 10/26/2018 (exact date).   Past  Medical History: Past Medical History:  Diagnosis Date  . Anemia   . Back pain   . Depression   . Hypertrophic cardiomyopathy (HCC)     Past Surgical History:   Past Surgical History:  Procedure Laterality Date  . ABDOMINAL SURGERY  2004   gastric bypass  . CESAREAN SECTION    . INNER EAR SURGERY    . LAPAROSCOPIC GASTRIC BYPASS  2004  . tubal rupture      Family History:  family history includes Arthritis in her mother; Behavior problems in her sister; Intestinal polyp in her father; Kidney disease in her father; Other in her mother.  Social History:  Social History   Socioeconomic History  . Marital status: Single    Spouse name: Not on file  . Number of children: Not on file  . Years of education: Not on file  . Highest education level: Not on file  Occupational History  . Not on file  Social Needs  . Financial resource strain: Not on file  . Food insecurity:    Worry: Not on file    Inability: Not on file  . Transportation needs:    Medical: Not on file    Non-medical: Not on file  Tobacco Use  . Smoking status: Passive Smoke Exposure - Never Smoker  . Smokeless tobacco: Never Used  Substance and Sexual Activity  .  Alcohol use: Yes    Comment: socially beer  . Drug use: No  . Sexual activity: Yes  Lifestyle  . Physical activity:    Days per week: Not on file    Minutes per session: Not on file  . Stress: Not on file  Relationships  . Social connections:    Talks on phone: Not on file    Gets together: Not on file    Attends religious service: Not on file    Active member of club or organization: Not on file    Attends meetings of clubs or organizations: Not on file    Relationship status: Not on file  . Intimate partner violence:    Fear of current or ex partner: Not on file    Emotionally abused: Not on file    Physically abused: Not on file    Forced sexual activity: Not on file  Other Topics Concern  . Not on file  Social History Narrative   . Not on file    Home Medications:  Medications reconciled in EPIC  No current facility-administered medications on file prior to encounter.    No current outpatient medications on file prior to encounter.    Allergies:  Allergies  Allergen Reactions  . Clindamycin/Lincomycin     Rash    Physical Exam:  Temp:  [98.2 F (36.8 C)] 98.2 F (36.8 C) (05/21 2112) Pulse Rate:  [71-72] 72 (05/21 2316) Resp:  [6-20] 20 (05/21 2316) BP: (141-147)/(76-89) 147/76 (05/21 2316) SpO2:  [99 %-100 %] 100 % (05/21 2316) Weight:  [99.8 kg] 99.8 kg (05/21 2113)  Reported exam by ED attending physican  General Appearance:  Well developed, well nourished, no acute distress, alert and oriented x3 HEENT:  Normocephalic atraumatic, extraocular movements intact, moist mucous membranes  Abdomen:  Bowel sounds present, soft, nontender, nondistended, no abnormal masses, no epigastric pain Extremities:  Full range of motion, no pedal edema, 2+ distal pulses, no tenderness Skin:  normal coloration and turgor, no rashes, no suspicious skin lesions noted  Neurologic:  Cranial nerves 2-12 grossly intact, normal muscle tone, strength 5/5 all four extremities Psychiatric:  Normal mood and affect, appropriate, no AH/VH Pelvic:  Deferred  Labs/Studies:   CBC and Coags:  Lab Results  Component Value Date   WBC 10.0 12/02/2018   NEUTOPHILPCT 62 12/02/2018   EOSPCT 1 12/02/2018   BASOPCT 0 12/02/2018   LYMPHOPCT 29 12/02/2018   HGB 12.0 12/02/2018   HCT 37.3 12/02/2018   MCV 85.9 12/02/2018   PLT 359 12/02/2018   CMP:  Lab Results  Component Value Date   NA 137 12/02/2018   K 4.0 12/02/2018   CL 106 12/02/2018   CO2 23 12/02/2018   BUN 14 12/02/2018   CREATININE 0.51 12/02/2018   CREATININE 0.83 08/17/2016   CREATININE 0.66 05/25/2016   PROT 7.6 12/02/2018   BILITOT 0.4 12/02/2018   ALT 41 12/02/2018   AST 35 12/02/2018   ALKPHOS 103 12/02/2018   Other Labs:  Beta hcg: 4802 Blood  type: pending today, O positive in the past  Other Imaging: US Ob Comp Less 14 Wks  Result Date: 12/02/2018 CLINICAL DATA:  Initial evaluation for acute vaginal bleeding, early pregnancy. EXAM: OBSTETRIC <14 WK [place] AND TRANSVAGINAL OB US TECHNIQUE: Both transabdominal and transvaginal ultrasound examinations were performed for complete evaluation of the gestation as well as the maternal uterus, adnexal regions, and pelvic cul-de-sac. Transvaginal technique was performed to assess early pregnancy. COMPARISON:  None  available. FINDINGS: Intrauterine gestational sac: Negative. Yolk sac:  Present-right adnexa. Embryo:  Present-right adnexa. Cardiac Activity: Present-right adnexa. Heart Rate: 89 bpm CRL: 2.0 mm   5 w   5 d                  [place] EDC: N/A Subchorionic hemorrhage:  None visualized. Maternal uterus/adnexae: Left ovary normal in appearance. Right adnexal mass measuring 1.4 x 1.2 x 1.2 cm containing a yolk sac and embryo is seen. Positive fetal heart rate as above. Peripherally increased vascularity. This is positioned immediately adjacent to the right ovary, consistent with right ectopic pregnancy, likely tubal in location. Adjacent right ovary enlarged with a large simple cyst measuring 4.5 x 3.5 x 4.4 cm. No free fluid within the pelvis. IMPRESSION: 1. Live ectopic pregnancy position within the right adnexa as above. No free fluid to suggest rupture. 2. Adjacent 4.5 cm simple right ovarian cyst. Critical Value/emergent results were called by telephone at the time of interpretation on 12/02/2018 at 11:29 pm to Dr. Dorothea Glassman , who verbally acknowledged these results. Electronically Signed   By: Rise Mu M.D.   On: 12/02/2018 23:29   US Ob Transvaginal  Result Date: 12/02/2018 CLINICAL DATA:  Initial evaluation for acute vaginal bleeding, early pregnancy. EXAM: OBSTETRIC <14 WK [place] AND TRANSVAGINAL OB US TECHNIQUE: Both transabdominal and transvaginal ultrasound examinations were performed  for complete evaluation of the gestation as well as the maternal uterus, adnexal regions, and pelvic cul-de-sac. Transvaginal technique was performed to assess early pregnancy. COMPARISON:  None available. FINDINGS: Intrauterine gestational sac: Negative. Yolk sac:  Present-right adnexa. Embryo:  Present-right adnexa. Cardiac Activity: Present-right adnexa. Heart Rate: 89 bpm CRL: 2.0 mm   5 w   5 d                  [place] EDC: N/A Subchorionic hemorrhage:  None visualized. Maternal uterus/adnexae: Left ovary normal in appearance. Right adnexal mass measuring 1.4 x 1.2 x 1.2 cm containing a yolk sac and embryo is seen. Positive fetal heart rate as above. Peripherally increased vascularity. This is positioned immediately adjacent to the right ovary, consistent with right ectopic pregnancy, likely tubal in location. Adjacent right ovary enlarged with a large simple cyst measuring 4.5 x 3.5 x 4.4 cm. No free fluid within the pelvis. IMPRESSION: 1. Live ectopic pregnancy position within the right adnexa as above. No free fluid to suggest rupture. 2. Adjacent 4.5 cm simple right ovarian cyst. Critical Value/emergent results were called by telephone at the time of interpretation on 12/02/2018 at 11:29 pm to Dr. Dorothea Glassman , who verbally acknowledged these results. Electronically Signed   By: Rise Mu M.D.   On: 12/02/2018 23:29     Assessment / Plan:   NHI BUTRUM is a 43 y.o. [id number] at [redacted]w[redacted]d with a nonruptured right ectopic pregnancy and a hx of right ruptured ectopic pregnancy.   Ectopic pregnancy: Causes discussed with patient, including prevalence, common causes, and outcomes.  The patient is very tearful. She is uncertain about future fertility and would not like to rule it out. However, of both tubes are damaged she asks that we remove both so she does not have to experience this again. She said she feels overwhelmed with both the pregnancy and the ectopic placement. We discussed  methotrexate in detail, and she has decided on lap salpingectomy of the right tube. I recommend this treatment strongly. However, she requests that she be given time to consider  the options, and given that we need to wait for her coronavirus result anyway and that she is hemodynamically stable, we have decided to add on her surgery for early morning, in about 8 hours. I have recommended that she remain for observation between now and then, and that she is NPO. She is aware of the risks of rupture, including transfusion, emergency surgery, damage to pelvic organs and very rarely death. She agrees to remain in house until the am.   Consents signed today. Risks of dx lap with salpingectomy and evacuation of hemoperitoneum surgery were discussed with the patient including but not limited to: bleeding which may require transfusion; infection which may require antibiotics; injury to uterus or surrounding organs; intrauterine scarring which may impair future fertility if D&C is required; need for additional procedures including laparotomy or laparoscopy; and other postoperative/anesthesia complications. Written informed consent was obtained.  This is a scheduled same-day surgery. She will have a postop visit in 2 weeks to review operative findings and pathology.

## 2018-12-03 NOTE — Transfer of Care (Signed)
Immediate Anesthesia Transfer of Care Note  Patient: Rebecca Lang  Procedure(s) Performed: DIAGNOSTIC LAPAROSCOPY WITH REMOVAL OF RIGHT ECTOPIC PREGNANCY AND RIGHT OVARY (Right Abdomen)  Patient Location: PACU  Anesthesia Type:General  Level of Consciousness: awake, alert  and oriented  Airway & Oxygen Therapy: Patient Spontanous Breathing and Patient connected to face mask oxygen  Post-op Assessment: Report given to RN and Post -op Vital signs reviewed and stable  Post vital signs: Reviewed and stable  Last Vitals:  Vitals Value Taken Time  BP    Temp    Pulse 68   Resp 15   SpO2 100     Last Pain:  Vitals:   12/03/18 0851  TempSrc: Temporal  PainSc:       Patients Stated Pain Goal: 0 (12/03/18 0134)  Complications: No apparent anesthesia complications

## 2018-12-04 LAB — BASIC METABOLIC PANEL
Anion gap: 8 (ref 5–15)
BUN: 12 mg/dL (ref 6–20)
CO2: 23 mmol/L (ref 22–32)
Calcium: 8.7 mg/dL — ABNORMAL LOW (ref 8.9–10.3)
Chloride: 107 mmol/L (ref 98–111)
Creatinine, Ser: 0.58 mg/dL (ref 0.44–1.00)
GFR calc Af Amer: >60 mL/min (ref >60)
GFR calc non Af Amer: >60 mL/min (ref >60)
Glucose, Bld: 99 mg/dL (ref 70–99)
Potassium: 3.5 mmol/L (ref 3.5–5.1)
Sodium: 138 mmol/L (ref 135–145)

## 2018-12-04 LAB — CBC
HCT: 34.6 % — ABNORMAL LOW (ref 36.0–46.0)
Hemoglobin: 10.7 g/dL — ABNORMAL LOW (ref 12.0–15.0)
MCH: 27.3 pg (ref 26.0–34.0)
MCHC: 30.9 g/dL (ref 30.0–36.0)
MCV: 88.3 fL (ref 80.0–100.0)
Platelets: 302 10*3/uL (ref 150–400)
RBC: 3.92 MIL/uL (ref 3.87–5.11)
RDW: 15.5 % (ref 11.5–15.5)
WBC: 10.6 10*3/uL — ABNORMAL HIGH (ref 4.0–10.5)
nRBC: 0 % (ref 0.0–0.2)

## 2018-12-04 NOTE — Progress Notes (Signed)
Discharge instructions provided.  Pt verbalizes understanding of all instructions and follow-up care.  Prescription given.  Pt discharged to home at 1024 on 12/04/18 via wheelchair by RN. Reynold Bowen, RN 12/04/2018 11:07 AM

## 2018-12-07 LAB — SURGICAL PATHOLOGY

## 2019-02-13 NOTE — Progress Notes (Signed)
[facility]  Telephone:(336) [phone] Fax:(336) [phone]  ID: Rebecca Lang OB: [birth year]  MR#: [id number]  AOZ#:[id number]#:[id number]  Patient Care Team: Evelene CroonNiemeyer, Meindert, MD as PCP - General (Family Medicine)  CHIEF COMPLAINT: Iron deficiency and B12 deficiency anemia.  INTERVAL HISTORY: Patient returns to clinic today for repeat laboratory work and further evaluation.  She continues to have chronic weakness and fatigue, but otherwise feels well. She has no neurologic complaints.  She denies any recent fevers or illnesses.  She has a good appetite and denies weight loss.  She denies any chest pain, shortness of breath, cough, or hemoptysis.  She denies any nausea, vomiting, constipation, or diarrhea.  She denies any melena or hematochezia.  She has no urinary complaints.  Patient feels at her baseline offers no further specific complaints today.  REVIEW OF SYSTEMS:   Review of Systems  Constitutional: Positive for malaise/fatigue. Negative for fever and weight loss.  Respiratory: Negative.  Negative for cough and shortness of breath.   Cardiovascular: Negative.  Negative for chest pain and leg swelling.  Gastrointestinal: Negative.  Negative for abdominal pain, blood in stool and melena.  Genitourinary: Negative.  Negative for dysuria and hematuria.  Musculoskeletal: Negative.  Negative for back pain.  Skin: Negative.  Negative for rash.  Neurological: Positive for weakness. Negative for focal weakness and headaches.  Psychiatric/Behavioral: Negative.  The patient is not nervous/anxious.     As per HPI. Otherwise, a complete review of systems is negative.  PAST MEDICAL HISTORY: Past Medical History:  Diagnosis Date  . Anemia   . Back pain   . Depression   . Hypertrophic cardiomyopathy (HCC)     PAST SURGICAL HISTORY: Past Surgical History:  Procedure Laterality Date  . ABDOMINAL SURGERY  2004   gastric bypass  . CESAREAN SECTION    . DIAGNOSTIC LAPAROSCOPY  WITH REMOVAL OF ECTOPIC PREGNANCY Right 12/03/2018   Procedure: DIAGNOSTIC LAPAROSCOPY WITH REMOVAL OF RIGHT ECTOPIC PREGNANCY AND RIGHT OVARY;  Surgeon: Christeen DouglasBeasley, Bethany, MD;  Location: [facility] ORS;  Service: Gynecology;  Laterality: Right;  . INNER EAR SURGERY    . LAPAROSCOPIC GASTRIC BYPASS  2004  . tubal rupture      FAMILY HISTORY: Family History  Problem Relation Age of Onset  . Arthritis Mother   . Other Mother        back pain w stimulator ( internal )  . Kidney disease Father   . Intestinal polyp Father   . Behavior problems Sister     ADVANCED DIRECTIVES (Y/N):  N  HEALTH MAINTENANCE: Social History   Tobacco Use  . Smoking status: Passive Smoke Exposure - Never Smoker  . Smokeless tobacco: Never Used  Substance Use Topics  . Alcohol use: Yes    Comment: socially beer  . Drug use: No     Colonoscopy:  PAP:  Bone density:  Lipid panel:  Allergies  Allergen Reactions  . Clindamycin/Lincomycin     Rash    Current Outpatient Medications  Medication Sig Dispense Refill  . docusate sodium (COLACE) 100 MG capsule Take 1 capsule (100 mg total) by mouth 2 (two) times daily. To keep stools soft (Patient not taking: Reported on 02/18/2019) 30 capsule 0  . gabapentin (NEURONTIN) 800 MG tablet Take 1 tablet (800 mg total) by mouth at bedtime for 14 days. Take nightly for 3 days, then up to 14 days as needed 14 tablet 0  . oxycodone (OXY-IR) 5 MG capsule Take 1 capsule (5 mg total) by  mouth every 6 (six) hours as needed for pain. (Patient not taking: Reported on 02/18/2019) 15 capsule 0   No current facility-administered medications for this visit.     OBJECTIVE: Vitals:   02/18/19 1344  BP: 140/83  Pulse: 63  Resp: 18  Temp: 97.9 F (36.6 C)     Body mass index is 39.19 kg/m.    ECOG FS:0 - Asymptomatic  General: Well-developed, well-nourished, no acute distress. Eyes: Pink conjunctiva, anicteric sclera. HEENT: Normocephalic, moist mucous membranes. Lungs:  Clear to auscultation bilaterally. Heart: Regular rate and rhythm. No rubs, murmurs, or gallops. Abdomen: Soft, nontender, nondistended. No organomegaly noted, normoactive bowel sounds. Musculoskeletal: No edema, cyanosis, or clubbing. Neuro: Alert, answering all questions appropriately. Cranial nerves grossly intact. Skin: No rashes or petechiae noted. Psych: Normal affect.  LAB RESULTS:  Lab Results  Component Value Date   NA 138 12/04/2018   K 3.5 12/04/2018   CL 107 12/04/2018   CO2 23 12/04/2018   GLUCOSE 99 12/04/2018   BUN 12 12/04/2018   CREATININE 0.58 12/04/2018   CALCIUM 8.7 (L) 12/04/2018   PROT 7.6 12/02/2018   ALBUMIN 3.8 12/02/2018   AST 35 12/02/2018   ALT 41 12/02/2018   ALKPHOS 103 12/02/2018   BILITOT 0.4 12/02/2018   GFRNONAA >60 12/04/2018   GFRAA >60 12/04/2018    Lab Results  Component Value Date   WBC 8.7 02/18/2019   NEUTROABS 5.6 02/18/2019   HGB 11.3 (L) 02/18/2019   HCT 34.9 (L) 02/18/2019   MCV 84.9 02/18/2019   PLT 305 02/18/2019   Lab Results  Component Value Date   IRON 25 (L) 02/18/2019   TIBC 459 (H) 02/18/2019   IRONPCTSAT 5 (L) 02/18/2019   Lab Results  Component Value Date   FERRITIN 8 (L) 02/18/2019     STUDIES: No results found.  ASSESSMENT: Iron deficiency and B12 deficiency anemia  PLAN:   1. Iron deficiency and B12 deficiency anemia: Likely secondary to poor absorption from patient's history of gastric bypass surgery.  Previously, the remainder of her laboratory work was either negative or within normal limits.  Patient's hemoglobin is decreased, but relatively stable at 11.3.  Her iron stores have trended down, but she does not require additional IV Feraheme today.  She will likely need treatment at her next clinic appointment.  Proceed with B12 injection today.  Return to clinic in 4 months with repeat laboratory work, further evaluation, and continuation of treatment.    I spent a total of 20 minutes face-to-face  with the patient of which greater than 50% of the visit was spent in counseling and coordination of care as detailed above.  Patient expressed understanding and was in agreement with this plan. She also understands that She can call clinic at any time with any questions, concerns, or complaints.    Lloyd Huger, MD   02/19/2019 8:37 AM

## 2019-02-17 ENCOUNTER — Other Ambulatory Visit: Payer: Self-pay

## 2019-02-18 ENCOUNTER — Inpatient Hospital Stay (HOSPITAL_BASED_OUTPATIENT_CLINIC_OR_DEPARTMENT_OTHER): Payer: Medicaid Other | Admitting: Oncology

## 2019-02-18 ENCOUNTER — Inpatient Hospital Stay: Payer: Medicaid Other | Attending: Oncology

## 2019-02-18 ENCOUNTER — Other Ambulatory Visit: Payer: Self-pay

## 2019-02-18 ENCOUNTER — Inpatient Hospital Stay: Payer: Medicaid Other

## 2019-02-18 VITALS — BP 140/83 | HR 63 | Temp 97.9°F | Resp 18 | Wt 228.3 lb

## 2019-02-18 DIAGNOSIS — E538 Deficiency of other specified B group vitamins: Secondary | ICD-10-CM | POA: Diagnosis not present

## 2019-02-18 DIAGNOSIS — F329 Major depressive disorder, single episode, unspecified: Secondary | ICD-10-CM | POA: Insufficient documentation

## 2019-02-18 DIAGNOSIS — M549 Dorsalgia, unspecified: Secondary | ICD-10-CM | POA: Diagnosis not present

## 2019-02-18 DIAGNOSIS — D509 Iron deficiency anemia, unspecified: Secondary | ICD-10-CM

## 2019-02-18 DIAGNOSIS — I422 Other hypertrophic cardiomyopathy: Secondary | ICD-10-CM | POA: Diagnosis not present

## 2019-02-18 DIAGNOSIS — Z79899 Other long term (current) drug therapy: Secondary | ICD-10-CM | POA: Diagnosis not present

## 2019-02-18 LAB — CBC WITH DIFFERENTIAL/PLATELET
Abs Immature Granulocytes: 0.03 10*3/uL (ref 0.00–0.07)
Basophils Absolute: 0 10*3/uL (ref 0.0–0.1)
Basophils Relative: 0 %
Eosinophils Absolute: 0.1 10*3/uL (ref 0.0–0.5)
Eosinophils Relative: 1 %
HCT: 34.9 % — ABNORMAL LOW (ref 36.0–46.0)
Hemoglobin: 11.3 g/dL — ABNORMAL LOW (ref 12.0–15.0)
Immature Granulocytes: 0 %
Lymphocytes Relative: 28 %
Lymphs Abs: 2.5 10*3/uL (ref 0.7–4.0)
MCH: 27.5 pg (ref 26.0–34.0)
MCHC: 32.4 g/dL (ref 30.0–36.0)
MCV: 84.9 fL (ref 80.0–100.0)
Monocytes Absolute: 0.6 10*3/uL (ref 0.1–1.0)
Monocytes Relative: 7 %
Neutro Abs: 5.6 10*3/uL (ref 1.7–7.7)
Neutrophils Relative %: 64 %
Platelets: 305 10*3/uL (ref 150–400)
RBC: 4.11 MIL/uL (ref 3.87–5.11)
RDW: 15 % (ref 11.5–15.5)
WBC: 8.7 10*3/uL (ref 4.0–10.5)
nRBC: 0 % (ref 0.0–0.2)

## 2019-02-18 LAB — IRON AND TIBC
Iron: 25 ug/dL — ABNORMAL LOW (ref 28–170)
Saturation Ratios: 5 % — ABNORMAL LOW (ref 10.4–31.8)
TIBC: 459 ug/dL — ABNORMAL HIGH (ref 250–450)
UIBC: 434 ug/dL

## 2019-02-18 LAB — FERRITIN: Ferritin: 8 ng/mL — ABNORMAL LOW (ref 11–307)

## 2019-02-18 LAB — VITAMIN B12: Vitamin B-12: 66 pg/mL — ABNORMAL LOW (ref 180–914)

## 2019-02-18 MED ORDER — CYANOCOBALAMIN 1000 MCG/ML IJ SOLN
1000.0000 ug | Freq: Once | INTRAMUSCULAR | Status: AC
  Administered 2019-02-18: 1000 ug via INTRAMUSCULAR

## 2019-02-18 NOTE — Progress Notes (Signed)
Patient reports that she is feeling exhausted.  Has not received a B12 injection 10/2018.

## 2019-06-16 ENCOUNTER — Other Ambulatory Visit: Payer: Self-pay

## 2019-06-17 ENCOUNTER — Other Ambulatory Visit: Payer: Self-pay

## 2019-06-17 ENCOUNTER — Inpatient Hospital Stay: Payer: Medicaid Other | Attending: Oncology

## 2019-06-17 DIAGNOSIS — Z79899 Other long term (current) drug therapy: Secondary | ICD-10-CM | POA: Diagnosis not present

## 2019-06-17 DIAGNOSIS — R5383 Other fatigue: Secondary | ICD-10-CM | POA: Diagnosis not present

## 2019-06-17 DIAGNOSIS — D509 Iron deficiency anemia, unspecified: Secondary | ICD-10-CM | POA: Diagnosis not present

## 2019-06-17 DIAGNOSIS — R531 Weakness: Secondary | ICD-10-CM | POA: Diagnosis not present

## 2019-06-17 DIAGNOSIS — D649 Anemia, unspecified: Secondary | ICD-10-CM | POA: Diagnosis not present

## 2019-06-17 DIAGNOSIS — E538 Deficiency of other specified B group vitamins: Secondary | ICD-10-CM | POA: Insufficient documentation

## 2019-06-17 DIAGNOSIS — F329 Major depressive disorder, single episode, unspecified: Secondary | ICD-10-CM | POA: Diagnosis not present

## 2019-06-17 DIAGNOSIS — I422 Other hypertrophic cardiomyopathy: Secondary | ICD-10-CM | POA: Diagnosis not present

## 2019-06-17 LAB — CBC WITH DIFFERENTIAL/PLATELET
Abs Immature Granulocytes: 0.02 10*3/uL (ref 0.00–0.07)
Basophils Absolute: 0 10*3/uL (ref 0.0–0.1)
Basophils Relative: 0 %
Eosinophils Absolute: 0 10*3/uL (ref 0.0–0.5)
Eosinophils Relative: 0 %
HCT: 36.9 % (ref 36.0–46.0)
Hemoglobin: 11.6 g/dL — ABNORMAL LOW (ref 12.0–15.0)
Immature Granulocytes: 0 %
Lymphocytes Relative: 21 %
Lymphs Abs: 2 10*3/uL (ref 0.7–4.0)
MCH: 26.7 pg (ref 26.0–34.0)
MCHC: 31.4 g/dL (ref 30.0–36.0)
MCV: 85 fL (ref 80.0–100.0)
Monocytes Absolute: 0.6 10*3/uL (ref 0.1–1.0)
Monocytes Relative: 7 %
Neutro Abs: 6.7 10*3/uL (ref 1.7–7.7)
Neutrophils Relative %: 72 %
Platelets: 314 10*3/uL (ref 150–400)
RBC: 4.34 MIL/uL (ref 3.87–5.11)
RDW: 14.4 % (ref 11.5–15.5)
WBC: 9.3 10*3/uL (ref 4.0–10.5)
nRBC: 0 % (ref 0.0–0.2)

## 2019-06-17 LAB — IRON AND TIBC
Iron: 75 ug/dL (ref 28–170)
Saturation Ratios: 16 % (ref 10.4–31.8)
TIBC: 459 ug/dL — ABNORMAL HIGH (ref 250–450)
UIBC: 385 ug/dL

## 2019-06-17 LAB — FERRITIN: Ferritin: 8 ng/mL — ABNORMAL LOW (ref 11–307)

## 2019-06-19 NOTE — Progress Notes (Signed)
[facility]  [phone] Fax:(336) [phone]  ID: Rebecca Lang OB: 01-30-1976  MR#: [id number]  URK#:[id number]  Patient Care Team: Evelene Croon, MD as PCP - General (Family Medicine)  CHIEF COMPLAINT: Iron deficiency and B12 deficiency anemia.  INTERVAL HISTORY: Patient returns to clinic today for repeat laboratory work, further evaluation, consideration of additional IV iron.  She continues to have chronic weakness and fatigue, but otherwise feels well.  She has no neurologic complaints.  She denies any recent fevers or illnesses.  She has a good appetite and denies weight loss.  She denies any chest pain, shortness of breath, cough, or hemoptysis.  She denies any nausea, vomiting, constipation, or diarrhea.  She denies any melena or hematochezia.  She has no urinary complaints.  Patient offers no further specific complaints today.  REVIEW OF SYSTEMS:   Review of Systems  Constitutional: Positive for malaise/fatigue. Negative for fever and weight loss.  Respiratory: Negative.  Negative for cough and shortness of breath.   Cardiovascular: Negative.  Negative for chest pain and leg swelling.  Gastrointestinal: Negative.  Negative for abdominal pain, blood in stool and melena.  Genitourinary: Negative.  Negative for dysuria and hematuria.  Musculoskeletal: Negative.  Negative for back pain.  Skin: Negative.  Negative for rash.  Neurological: Positive for weakness. Negative for focal weakness and headaches.  Psychiatric/Behavioral: Negative.  The patient is not nervous/anxious.     As per HPI. Otherwise, a complete review of systems is negative.  PAST MEDICAL HISTORY: Past Medical History:  Diagnosis Date  . Anemia   . Back pain   . Depression   . Hypertrophic cardiomyopathy (HCC)     PAST SURGICAL HISTORY: Past Surgical History:  Procedure Laterality Date  . ABDOMINAL SURGERY  2004   gastric bypass  . CESAREAN SECTION    . DIAGNOSTIC  LAPAROSCOPY WITH REMOVAL OF ECTOPIC PREGNANCY Right 12/03/2018   Procedure: DIAGNOSTIC LAPAROSCOPY WITH REMOVAL OF RIGHT ECTOPIC PREGNANCY AND RIGHT OVARY;  Surgeon: Christeen Douglas, MD;  Location: [facility] ORS;  Service: Gynecology;  Laterality: Right;  . INNER EAR SURGERY    . LAPAROSCOPIC GASTRIC BYPASS  2004  . tubal rupture      FAMILY HISTORY: Family History  Problem Relation Age of Onset  . Arthritis Mother   . Other Mother        back pain w stimulator ( internal )  . Kidney disease Father   . Intestinal polyp Father   . Behavior problems Sister     ADVANCED DIRECTIVES (Y/N):  N  HEALTH MAINTENANCE: Social History   Tobacco Use  . Smoking status: Passive Smoke Exposure - Never Smoker  . Smokeless tobacco: Never Used  Substance Use Topics  . Alcohol use: Yes    Comment: socially beer  . Drug use: No     Colonoscopy:  PAP:  Bone density:  Lipid panel:  Allergies  Allergen Reactions  . Clindamycin/Lincomycin     Rash    Current Outpatient Medications  Medication Sig Dispense Refill  . norethindrone-ethinyl estradiol (LOESTRIN) 1-20 MG-MCG tablet Take by mouth.    Marland Kitchen UNABLE TO FIND Lipotropic injections given every 2 weeks    . docusate sodium (COLACE) 100 MG capsule Take 1 capsule (100 mg total) by mouth 2 (two) times daily. To keep stools soft (Patient not taking: Reported on 02/18/2019) 30 capsule 0  . gabapentin (NEURONTIN) 800 MG tablet Take 1 tablet (800 mg total) by mouth at bedtime for 14 days. Take nightly  for 3 days, then up to 14 days as needed 14 tablet 0  . oxycodone (OXY-IR) 5 MG capsule Take 1 capsule (5 mg total) by mouth every 6 (six) hours as needed for pain. (Patient not taking: Reported on 02/18/2019) 15 capsule 0   No current facility-administered medications for this visit.     OBJECTIVE: Vitals:   06/20/19 1418  BP: 126/79  Pulse: 65  Temp: (!) 96.8 F (36 C)  SpO2: 99%     Body mass index is 37.92 kg/m.    ECOG FS:0 - Asymptomatic   General: Well-developed, well-nourished, no acute distress. Eyes: Pink conjunctiva, anicteric sclera. HEENT: Normocephalic, moist mucous membranes. Lungs: Clear to auscultation bilaterally. Heart: Regular rate and rhythm. No rubs, murmurs, or gallops. Abdomen: Soft, nontender, nondistended. No organomegaly noted, normoactive bowel sounds. Musculoskeletal: No edema, cyanosis, or clubbing. Neuro: Alert, answering all questions appropriately. Cranial nerves grossly intact. Skin: No rashes or petechiae noted. Psych: Normal affect.  LAB RESULTS:  Lab Results  Component Value Date   NA 138 12/04/2018   K 3.5 12/04/2018   CL 107 12/04/2018   CO2 23 12/04/2018   GLUCOSE 99 12/04/2018   BUN 12 12/04/2018   CREATININE 0.58 12/04/2018   CALCIUM 8.7 (L) 12/04/2018   PROT 7.6 12/02/2018   ALBUMIN 3.8 12/02/2018   AST 35 12/02/2018   ALT 41 12/02/2018   ALKPHOS 103 12/02/2018   BILITOT 0.4 12/02/2018   GFRNONAA >60 12/04/2018   GFRAA >60 12/04/2018    Lab Results  Component Value Date   WBC 9.3 06/17/2019   NEUTROABS 6.7 06/17/2019   HGB 11.6 (L) 06/17/2019   HCT 36.9 06/17/2019   MCV 85.0 06/17/2019   PLT 314 06/17/2019   Lab Results  Component Value Date   IRON 75 06/17/2019   TIBC 459 (H) 06/17/2019   IRONPCTSAT 16 06/17/2019   Lab Results  Component Value Date   FERRITIN 8 (L) 06/17/2019     STUDIES: No results found.  ASSESSMENT: Iron deficiency and B12 deficiency anemia  PLAN:   1. Iron deficiency anemia: Likely secondary to poor absorption from patient's history of gastric bypass surgery.  Previously, the remainder of her laboratory work was either negative or within normal limits.  Patient's hemoglobin has trended up to 11.6, but her iron stores remain decreased.  Proceed with 510 mg IV Feraheme today.  Return to clinic in 4 months with repeat laboratory, further evaluation, and continuation of treatment. 2.  B12 deficiency: Patient reports she gets B12  injections with her primary care physician.     Patient expressed understanding and was in agreement with this plan. She also understands that She can call clinic at any time with any questions, concerns, or complaints.    Lloyd Huger, MD   06/21/2019 6:51 AM

## 2019-06-20 ENCOUNTER — Other Ambulatory Visit: Payer: Medicaid Other

## 2019-06-20 ENCOUNTER — Inpatient Hospital Stay: Payer: Medicaid Other

## 2019-06-20 ENCOUNTER — Encounter: Payer: Self-pay | Admitting: Oncology

## 2019-06-20 ENCOUNTER — Other Ambulatory Visit: Payer: Self-pay

## 2019-06-20 ENCOUNTER — Inpatient Hospital Stay (HOSPITAL_BASED_OUTPATIENT_CLINIC_OR_DEPARTMENT_OTHER): Payer: Medicaid Other | Admitting: Oncology

## 2019-06-20 VITALS — BP 124/67 | HR 52 | Temp 97.4°F | Resp 18

## 2019-06-20 VITALS — BP 126/79 | HR 65 | Temp 96.8°F | Wt 220.9 lb

## 2019-06-20 DIAGNOSIS — D509 Iron deficiency anemia, unspecified: Secondary | ICD-10-CM | POA: Diagnosis not present

## 2019-06-20 MED ORDER — SODIUM CHLORIDE 0.9 % IV SOLN
510.0000 mg | Freq: Once | INTRAVENOUS | Status: AC
  Administered 2019-06-20: 510 mg via INTRAVENOUS
  Filled 2019-06-20: qty 17

## 2019-06-20 MED ORDER — CYANOCOBALAMIN 1000 MCG/ML IJ SOLN: 1000.0000 ug | Freq: Once | INTRAMUSCULAR | Status: DC

## 2019-06-20 MED ORDER — SODIUM CHLORIDE 0.9 % IV SOLN
Freq: Once | INTRAVENOUS | Status: AC
  Administered 2019-06-20: 15:00:00 via INTRAVENOUS
  Filled 2019-06-20: qty 250

## 2019-06-20 NOTE — Progress Notes (Signed)
Pt tolerated infusion well. Pt denies any complaints or concerns at this time. No s/s of distress noted. Pt and VS stable at discharge.

## 2019-10-13 ENCOUNTER — Other Ambulatory Visit: Payer: Self-pay

## 2019-10-13 DIAGNOSIS — D509 Iron deficiency anemia, unspecified: Secondary | ICD-10-CM

## 2019-10-13 NOTE — Progress Notes (Signed)
[facility]  Telephone:([phone] Fax:(336) [phone]  ID: Rebecca Lang OB: June 13, 1976  MR#: [id number]  RXV#:[id number]  Patient Care Team: Lorelee Market, MD as PCP - General (Family Medicine)  CHIEF COMPLAINT: Iron deficiency and B12 deficiency anemia.  INTERVAL HISTORY: Patient returns to clinic today for repeat laboratory, further evaluation, and can continuation of IV Feraheme.  She currently feels well and is asymptomatic.  She does not complain of weakness or fatigue today.  She has no neurologic complaints.  She denies any recent fevers or illnesses.  She has a good appetite and denies weight loss.  She denies any chest pain, shortness of breath, cough, or hemoptysis.  She denies any nausea, vomiting, constipation, or diarrhea.  She denies any melena or hematochezia.  She has no urinary complaints.  Patient offers no specific complaints today.  REVIEW OF SYSTEMS:   Review of Systems  Constitutional: Negative.  Negative for fever, malaise/fatigue and weight loss.  Respiratory: Negative.  Negative for cough and shortness of breath.   Cardiovascular: Negative.  Negative for chest pain and leg swelling.  Gastrointestinal: Negative.  Negative for abdominal pain, blood in stool and melena.  Genitourinary: Negative.  Negative for dysuria and hematuria.  Musculoskeletal: Negative.  Negative for back pain.  Skin: Negative.  Negative for rash.  Neurological: Negative.  Negative for focal weakness, weakness and headaches.  Psychiatric/Behavioral: Negative.  The patient is not nervous/anxious.     As per HPI. Otherwise, a complete review of systems is negative.  PAST MEDICAL HISTORY: Past Medical History:  Diagnosis Date  . Anemia   . Back pain   . Depression   . Hypertrophic cardiomyopathy ([place])     PAST SURGICAL HISTORY: Past Surgical History:  Procedure Laterality Date  . ABDOMINAL SURGERY  2004   gastric bypass  . CESAREAN SECTION    .  DIAGNOSTIC LAPAROSCOPY WITH REMOVAL OF ECTOPIC PREGNANCY Right 12/03/2018   Procedure: DIAGNOSTIC LAPAROSCOPY WITH REMOVAL OF RIGHT ECTOPIC PREGNANCY AND RIGHT OVARY;  Surgeon: Benjaman Kindler, MD;  Location: [facility] ORS;  Service: Gynecology;  Laterality: Right;  . INNER EAR SURGERY    . LAPAROSCOPIC GASTRIC BYPASS  2004  . tubal rupture      FAMILY HISTORY: Family History  Problem Relation Age of Onset  . Arthritis Mother   . Other Mother        back pain w stimulator ( internal )  . Kidney disease Father   . Intestinal polyp Father   . Behavior problems Sister     ADVANCED DIRECTIVES (Y/N):  N  HEALTH MAINTENANCE: Social History   Tobacco Use  . Smoking status: Passive Smoke Exposure - Never Smoker  . Smokeless tobacco: Never Used  Substance Use Topics  . Alcohol use: Yes    Comment: socially beer  . Drug use: No     Colonoscopy:  PAP:  Bone density:  Lipid panel:  Allergies  Allergen Reactions  . Clindamycin/Lincomycin     Rash    Current Outpatient Medications  Medication Sig Dispense Refill  . Cyanocobalamin (B-12 COMPLIANCE INJECTION) 1000 MCG/ML KIT Inject 1,000 mcg as directed every 14 (fourteen) days.     . norethindrone-ethinyl estradiol (LOESTRIN) 1-20 MG-MCG tablet Take by mouth.    Marland Kitchen UNABLE TO FIND Lipotropic injections given every 2 weeks    . gabapentin (NEURONTIN) 800 MG tablet Take 1 tablet (800 mg total) by mouth at bedtime for 14 days. Take nightly for 3 days, then up to 14 days  as needed (Patient not taking: Reported on 10/17/2019) 14 tablet 0   No current facility-administered medications for this visit.   Facility-Administered Medications Ordered in Other Visits  Medication Dose Route Frequency Provider Last Rate Last Admin  . 0.9 %  sodium chloride infusion   Intravenous Once Lloyd Huger, MD      . ferumoxytol [facility]) 510 mg in sodium chloride 0.9 % 100 mL IVPB  510 mg Intravenous Once Lloyd Huger, MD         OBJECTIVE: Vitals:   10/17/19 1311  BP: (!) 134/91  Pulse: 69  Resp: 20  Temp: 97.8 F (36.6 C)  SpO2: 99%     Body mass index is 34.42 kg/m.    ECOG FS:0 - Asymptomatic  General: Well-developed, well-nourished, no acute distress. Eyes: Pink conjunctiva, anicteric sclera. HEENT: Normocephalic, moist mucous membranes. Lungs: No audible wheezing or coughing. Heart: Regular rate and rhythm. Abdomen: Soft, nontender, no obvious distention. Musculoskeletal: No edema, cyanosis, or clubbing. Neuro: Alert, answering all questions appropriately. Cranial nerves grossly intact. Skin: No rashes or petechiae noted. Psych: Normal affect.   LAB RESULTS:  Lab Results  Component Value Date   NA 138 12/04/2018   K 3.5 12/04/2018   CL 107 12/04/2018   CO2 23 12/04/2018   GLUCOSE 99 12/04/2018   BUN 12 12/04/2018   CREATININE 0.58 12/04/2018   CALCIUM 8.7 (L) 12/04/2018   PROT 7.6 12/02/2018   ALBUMIN 3.8 12/02/2018   AST 35 12/02/2018   ALT 41 12/02/2018   ALKPHOS 103 12/02/2018   BILITOT 0.4 12/02/2018   GFRNONAA >60 12/04/2018   GFRAA >60 12/04/2018    Lab Results  Component Value Date   WBC 9.8 10/14/2019   NEUTROABS 7.1 10/14/2019   HGB 13.7 10/14/2019   HCT 40.9 10/14/2019   MCV 91.1 10/14/2019   PLT 341 10/14/2019   Lab Results  Component Value Date   IRON 44 10/14/2019   TIBC 400 10/14/2019   IRONPCTSAT 11 10/14/2019   Lab Results  Component Value Date   FERRITIN 11 10/14/2019     STUDIES: No results found.  ASSESSMENT: Iron deficiency and B12 deficiency anemia  PLAN:   1. Iron deficiency anemia: Likely secondary to poor absorption from patient's history of gastric bypass surgery.  Previously, the remainder of her laboratory work was either negative or within normal limits.  Patient's hemoglobin and iron stores are now within normal limits, but will proceed with an additional 510 mg IV Feraheme for preventative measures.  She does not require second  infusion.  Return to clinic in 4 months with repeat laboratory work, further evaluation, and continuation of treatment if necessary.  2.  B12 deficiency: Patient reports she gets B12 injections with her primary care physician.    I spent a total of 30 minutes reviewing chart data, face-to-face evaluation with the patient, counseling and coordination of care as detailed above.  Patient expressed understanding and was in agreement with this plan. She also understands that She can call clinic at any time with any questions, concerns, or complaints.    Lloyd Huger, MD   10/17/2019 1:43 PM

## 2019-10-13 NOTE — Progress Notes (Signed)
cb

## 2019-10-14 ENCOUNTER — Inpatient Hospital Stay: Payer: Medicaid Other | Attending: Oncology

## 2019-10-14 ENCOUNTER — Other Ambulatory Visit: Payer: Self-pay

## 2019-10-14 DIAGNOSIS — I422 Other hypertrophic cardiomyopathy: Secondary | ICD-10-CM | POA: Diagnosis not present

## 2019-10-14 DIAGNOSIS — E538 Deficiency of other specified B group vitamins: Secondary | ICD-10-CM | POA: Diagnosis not present

## 2019-10-14 DIAGNOSIS — D509 Iron deficiency anemia, unspecified: Secondary | ICD-10-CM | POA: Diagnosis not present

## 2019-10-14 DIAGNOSIS — Z79899 Other long term (current) drug therapy: Secondary | ICD-10-CM | POA: Insufficient documentation

## 2019-10-14 DIAGNOSIS — Z9884 Bariatric surgery status: Secondary | ICD-10-CM | POA: Insufficient documentation

## 2019-10-14 DIAGNOSIS — F329 Major depressive disorder, single episode, unspecified: Secondary | ICD-10-CM | POA: Insufficient documentation

## 2019-10-14 LAB — CBC WITH DIFFERENTIAL/PLATELET
Abs Immature Granulocytes: 0.04 10*3/uL (ref 0.00–0.07)
Basophils Absolute: 0 10*3/uL (ref 0.0–0.1)
Basophils Relative: 0 %
Eosinophils Absolute: 0.1 10*3/uL (ref 0.0–0.5)
Eosinophils Relative: 1 %
HCT: 40.9 % (ref 36.0–46.0)
Hemoglobin: 13.7 g/dL (ref 12.0–15.0)
Immature Granulocytes: 0 %
Lymphocytes Relative: 19 %
Lymphs Abs: 1.9 10*3/uL (ref 0.7–4.0)
MCH: 30.5 pg (ref 26.0–34.0)
MCHC: 33.5 g/dL (ref 30.0–36.0)
MCV: 91.1 fL (ref 80.0–100.0)
Monocytes Absolute: 0.8 10*3/uL (ref 0.1–1.0)
Monocytes Relative: 8 %
Neutro Abs: 7.1 10*3/uL (ref 1.7–7.7)
Neutrophils Relative %: 72 %
Platelets: 341 10*3/uL (ref 150–400)
RBC: 4.49 MIL/uL (ref 3.87–5.11)
RDW: 13.2 % (ref 11.5–15.5)
WBC: 9.8 10*3/uL (ref 4.0–10.5)
nRBC: 0 % (ref 0.0–0.2)

## 2019-10-14 LAB — IRON AND TIBC
Iron: 44 ug/dL (ref 28–170)
Saturation Ratios: 11 % (ref 10.4–31.8)
TIBC: 400 ug/dL (ref 250–450)
UIBC: 356 ug/dL

## 2019-10-14 LAB — FERRITIN: Ferritin: 11 ng/mL (ref 11–307)

## 2019-10-17 ENCOUNTER — Encounter: Payer: Self-pay | Admitting: Oncology

## 2019-10-17 ENCOUNTER — Inpatient Hospital Stay (HOSPITAL_BASED_OUTPATIENT_CLINIC_OR_DEPARTMENT_OTHER): Payer: Medicaid Other | Admitting: Oncology

## 2019-10-17 ENCOUNTER — Inpatient Hospital Stay: Payer: Medicaid Other

## 2019-10-17 ENCOUNTER — Other Ambulatory Visit: Payer: Self-pay

## 2019-10-17 VITALS — BP 134/91 | HR 69 | Temp 97.8°F | Resp 20 | Wt 200.5 lb

## 2019-10-17 VITALS — BP 122/76 | HR 62 | Resp 18

## 2019-10-17 DIAGNOSIS — D509 Iron deficiency anemia, unspecified: Secondary | ICD-10-CM

## 2019-10-17 MED ORDER — SODIUM CHLORIDE 0.9 % IV SOLN
510.0000 mg | Freq: Once | INTRAVENOUS | Status: AC
  Administered 2019-10-17: 14:00:00 510 mg via INTRAVENOUS
  Filled 2019-10-17: qty 510

## 2019-10-17 MED ORDER — SODIUM CHLORIDE 0.9 % IV SOLN
Freq: Once | INTRAVENOUS | Status: AC
  Filled 2019-10-17: qty 250

## 2019-10-17 NOTE — Progress Notes (Signed)
Patient is here for follow up today for anemia but is concerned with a knot on left forearm. Patient states she noticed a month ago.

## 2019-10-19 ENCOUNTER — Emergency Department: Payer: Medicaid Other

## 2019-10-19 ENCOUNTER — Other Ambulatory Visit: Payer: Self-pay

## 2019-10-19 ENCOUNTER — Emergency Department
Admission: EM | Admit: 2019-10-19 | Discharge: 2019-10-20 | Disposition: A | Discharge status: Home / Self Care | Payer: Medicaid Other | Attending: Emergency Medicine | Admitting: Emergency Medicine

## 2019-10-19 DIAGNOSIS — R4182 Altered mental status, unspecified: Secondary | ICD-10-CM | POA: Diagnosis not present

## 2019-10-19 DIAGNOSIS — Y908 Blood alcohol level of 240 mg/100 ml or more: Secondary | ICD-10-CM | POA: Insufficient documentation

## 2019-10-19 DIAGNOSIS — I422 Other hypertrophic cardiomyopathy: Secondary | ICD-10-CM | POA: Insufficient documentation

## 2019-10-19 DIAGNOSIS — R1110 Vomiting, unspecified: Secondary | ICD-10-CM | POA: Diagnosis not present

## 2019-10-19 DIAGNOSIS — F10929 Alcohol use, unspecified with intoxication, unspecified: Secondary | ICD-10-CM | POA: Insufficient documentation

## 2019-10-19 DIAGNOSIS — F10920 Alcohol use, unspecified with intoxication, uncomplicated: Secondary | ICD-10-CM

## 2019-10-19 LAB — CBC WITH DIFFERENTIAL/PLATELET
Abs Immature Granulocytes: 0.02 10*3/uL (ref 0.00–0.07)
Basophils Absolute: 0 10*3/uL (ref 0.0–0.1)
Basophils Relative: 0 %
Eosinophils Absolute: 0 10*3/uL (ref 0.0–0.5)
Eosinophils Relative: 0 %
HCT: 39.4 % (ref 36.0–46.0)
Hemoglobin: 13.1 g/dL (ref 12.0–15.0)
Immature Granulocytes: 0 %
Lymphocytes Relative: 31 %
Lymphs Abs: 3.1 10*3/uL (ref 0.7–4.0)
MCH: 30.2 pg (ref 26.0–34.0)
MCHC: 33.2 g/dL (ref 30.0–36.0)
MCV: 90.8 fL (ref 80.0–100.0)
Monocytes Absolute: 0.6 10*3/uL (ref 0.1–1.0)
Monocytes Relative: 6 %
Neutro Abs: 6.2 10*3/uL (ref 1.7–7.7)
Neutrophils Relative %: 63 %
Platelets: 330 10*3/uL (ref 150–400)
RBC: 4.34 MIL/uL (ref 3.87–5.11)
RDW: 12.8 % (ref 11.5–15.5)
WBC: 10 10*3/uL (ref 4.0–10.5)
nRBC: 0 % (ref 0.0–0.2)

## 2019-10-19 LAB — ETHANOL: Alcohol, Ethyl (B): 300 mg/dL — ABNORMAL HIGH (ref <10)

## 2019-10-19 MED ORDER — ONDANSETRON HCL 4 MG/2ML IJ SOLN: 4.0000 mg | INTRAMUSCULAR | Status: DC

## 2019-10-19 NOTE — ED Notes (Signed)
Spoke with sister Karie Mainland who we can call when pt is ready for d/c [phone]

## 2019-10-19 NOTE — ED Triage Notes (Signed)
Pt here via [facility] [place] for alcohol intoxication. Per EMS friends report that pt had "passed out for two minutes and woke up confused". Pt arrives vomiting. Pt states "IDK what I did".   Ems VSS, cbg 148.

## 2019-10-19 NOTE — ED Provider Notes (Signed)
[facility] Emergency Department Provider Note  ____________________________________________   First MD Initiated Contact with Patient 10/19/19 2300     (approximate)  I have reviewed the triage vital signs and the nursing notes.   HISTORY  Chief Complaint Emesis and Alcohol Intoxication  Level 5 caveat:  history/ROS limited by acute intoxication  HPI Rebecca Lang is a 44 y.o. female with medical history as listed below which notably includes a history of hypertrophic cardiomyopathy as well as a history of depression and back pain.  She presents by EMS from a bar where she was reportedly drinking alcohol and then passed out.  Reportedly her friends told EMS that she passed out for 2 minutes and woke up confused.  She was vomiting upon arrival.  When she arrived she told her nurse that she did not know what happened.  She is currently sleeping but awakens to loud voice and brisk stimuli.     Past Medical History:  Diagnosis Date  . Anemia   . Back pain   . Depression   . Hypertrophic cardiomyopathy [facility])     Patient Active Problem List   Diagnosis Date Noted  . Ectopic pregnancy, tubal 12/03/2018  . Iron deficiency anemia 03/02/2018    Past Surgical History:  Procedure Laterality Date  . ABDOMINAL SURGERY  2004   gastric bypass  . CESAREAN SECTION    . DIAGNOSTIC LAPAROSCOPY WITH REMOVAL OF ECTOPIC PREGNANCY Right 12/03/2018   Procedure: DIAGNOSTIC LAPAROSCOPY WITH REMOVAL OF RIGHT ECTOPIC PREGNANCY AND RIGHT OVARY;  Surgeon: Benjaman Kindler, MD;  Location: [facility] ORS;  Service: Gynecology;  Laterality: Right;  . INNER EAR SURGERY    . LAPAROSCOPIC GASTRIC BYPASS  2004  . tubal rupture      Prior to Admission medications   Medication Sig Start Date End Date Taking? Authorizing Provider  Cyanocobalamin (B-12 COMPLIANCE INJECTION) 1000 MCG/ML KIT Inject 1,000 mcg as directed every 14 (fourteen) days.     [provider]    gabapentin (NEURONTIN) 800 MG tablet Take 1 tablet (800 mg total) by mouth at bedtime for 14 days. Take nightly for 3 days, then up to 14 days as needed Patient not taking: Reported on 10/17/2019 12/03/18 10/17/19  Benjaman Kindler, MD  norethindrone-ethinyl estradiol (LOESTRIN) 1-20 MG-MCG tablet Take by mouth. 04/18/19   [provider]  UNABLE TO FIND Lipotropic injections given every 2 weeks    [provider]    Allergies Clindamycin/lincomycin  Family History  Problem Relation Age of Onset  . Arthritis Mother   . Other Mother        back pain w stimulator ( internal )  . Kidney disease Father   . Intestinal polyp Father   . Behavior problems Sister     Social History Social History   Tobacco Use  . Smoking status: Passive Smoke Exposure - Never Smoker  . Smokeless tobacco: Never Used  Substance Use Topics  . Alcohol use: Yes    Comment: socially beer  . Drug use: No    Review of Systems Level 5 caveat:  history/ROS limited by acute intoxication    ____________________________________________   PHYSICAL EXAM:  VITAL SIGNS: ED Triage Vitals [10/19/19 2242]  Enc Vitals Group     BP (!) 141/93     Pulse Rate 86     Resp 18     Temp 98 F (36.7 C)     Temp Source Oral     SpO2 98 %  Weight 90.7 kg (200 lb)     Height 1.626 m (5' 4" )     Head Circumference      Peak Flow      Pain Score 0     Pain Loc      Pain Edu?      Excl. in [place]?     Constitutional: Obtunded.  Awakens to brisk stimuli (shaking her shoulder) and speaking loudly to her. Eyes: Conjunctivae are normal.  Pupils are equal and reactive. Head: Atraumatic. Nose: No congestion/rhinnorhea. Mouth/Throat: Patient is wearing a mask. Neck: No stridor.  No meningeal signs.   Cardiovascular: Borderline tachycardia, regular rhythm. Good peripheral circulation. Grossly normal heart sounds. Respiratory: Normal respiratory effort.  No retractions. Gastrointestinal: Soft and  nontender. No distention.  Musculoskeletal: No reproducible tenderness to palpation of the C-spine.  No step-offs or deformities of the C-spine.  No lower extremity tenderness nor edema. No gross deformities of extremities. Neurologic:  GCS 10 (Eye 2, Verbal 3, Motor 5).  Appears clearly intoxicated.  Unable to participate in neurological exam but is moving all four extremities. Skin:  Skin is warm, dry and intact.   ____________________________________________   LABS (all labs ordered are listed, but only abnormal results are displayed)  Labs Reviewed  ETHANOL - Abnormal; Notable for the following components:      Result Value   Alcohol, Ethyl (B) 300 (*)    All other components within normal limits  COMPREHENSIVE METABOLIC PANEL - Abnormal; Notable for the following components:   Sodium 132 (*)    Potassium 3.0 (*)    CO2 18 (*)    Glucose, Bld 102 (*)    Calcium 8.8 (*)    All other components within normal limits  URINE DRUG SCREEN, QUALITATIVE ([facility] ONLY) - Abnormal; Notable for the following components:   Amphetamines, Ur Screen POSITIVE (*)    All other components within normal limits  CBC WITH DIFFERENTIAL/PLATELET  HCG, QUANTITATIVE, PREGNANCY  MAGNESIUM  TROPONIN I (HIGH SENSITIVITY)  TROPONIN I (HIGH SENSITIVITY)   ____________________________________________  EKG  ED ECG REPORT I, Hinda Kehr, the attending physician, personally viewed and interpreted this ECG.  Date: 10/20/2019 EKG Time: 00: 00 Rate: 75 Rhythm: normal sinus rhythm QRS Axis: normal Intervals: normal ST/T Wave abnormalities: normal Narrative Interpretation: no evidence of acute ischemia  ____________________________________________  RADIOLOGY I, Hinda Kehr, personally viewed and evaluated these images (plain radiographs) as part of my medical decision making, as well as reviewing the written report by the radiologist.  ED MD interpretation: No acute abnormalities or traumatic  injuries identified on CT head nor CT cervical spine.  Official radiology report(s): CT Head Wo Contrast  Result Date: 10/20/2019 CLINICAL DATA:  Loss of consciousness with altered mental status. EXAM: CT HEAD WITHOUT CONTRAST TECHNIQUE: Contiguous axial images were obtained from the base of the skull through the vertex without intravenous contrast. COMPARISON:  July 17, 2012 FINDINGS: Brain: No evidence of acute infarction, hemorrhage, hydrocephalus, extra-axial collection or mass lesion/mass effect. Vascular: No hyperdense vessel or unexpected calcification. Skull: Normal. Negative for fracture or focal lesion. Sinuses/Orbits: No acute finding. Other: None. IMPRESSION: No acute intracranial pathology. Electronically Signed   By: Virgina Norfolk M.D.   On: 10/20/2019 00:02   CT Cervical Spine Wo Contrast  Result Date: 10/20/2019 CLINICAL DATA:  Status post fall with altered mental status. EXAM: CT CERVICAL SPINE WITHOUT CONTRAST TECHNIQUE: Multidetector CT imaging of the cervical spine was performed without intravenous contrast. Multiplanar CT image reconstructions were also  generated. COMPARISON:  None. FINDINGS: Alignment: Normal. Skull base and vertebrae: No acute fracture. No primary bone lesion or focal pathologic process. Soft tissues and spinal canal: No prevertebral fluid or swelling. No visible canal hematoma. Disc levels: There are normal multilevel endplates with normal multilevel intervertebral disc spaces. Upper chest: Negative. Other: None. IMPRESSION: No acute fracture or subluxation of the cervical spine. Electronically Signed   By: Virgina Norfolk M.D.   On: 10/20/2019 00:05    ____________________________________________   PROCEDURES   Procedure(s) performed (including Critical Care):  Procedures   ____________________________________________   INITIAL IMPRESSION / MDM / [place] / ED COURSE  As part of my medical decision making, I reviewed the following  data within the [facility] notes reviewed and incorporated, Labs reviewed , EKG interpreted , Old chart reviewed, Notes from prior ED visits and [place] Controlled Substance Database   Differential diagnosis includes, but is not limited to, alcohol intoxication, other nonspecific medication or drug intoxication/side effect, acute intracranial injury or cervical spine injury, less likely infectious process.  Electrolyte or metabolic abnormalities also possible.  The patient's history is complicated by documented hypertrophic cardiomyopathy which seems unlikely to contribute to the current presentation but I will get an EKG. she is on continuous pulse oximeter right now but in the absence of any other concerning history she is not currently on a cardiac monitor.  Basic labs are pending.  She does not qualify for Nexus criteria given her obvious and heavy intoxication and given the history that she passed out, was altered, and I cannot rule out potentially life-threatening injury, I will obtain a head CT without contrast and cervical spine CT.  Assuming the patient does not have a traumatic injury or other emergent medical condition, anticipate discharge once she is clinically sober.  Her sister has already been on the phone with one of the ED nurses and says she can pick up the patient when the patient is safe to go home.  Given that she has been vomiting and she is at risk for aspiration I have ordered Zofran 4 mg IV.      Clinical Course as of Oct 20 230  Wed Oct 19, 2019  2356 Alcohol, Ethyl (B)(!): 300 [CF]  Thu Oct 20, 2019  0008 No acute/traumatic abnormalities identified by radiology on CTs of head and C-spine   [CF]  0024 Mild hypokalemia, possibly due to vomiting, otherwise unremarkable.  Comprehensive metabolic panel(!) [CF]  5374 HCG, Beta Chain, Quant, S: <1 [CF]  0231 Patient is awake and alert, upset and crying, but in no physical distress.  She has at least 2  different family members who can come pick her up.  She is ambulatory without difficulty to and from the toilet and she does not need any additional emergency department observation.  I will discharge her with my usual and customary recommendations and precautions.   [CF]    Clinical Course User Index [CF] Hinda Kehr, MD     ____________________________________________  FINAL CLINICAL IMPRESSION(S) / ED DIAGNOSES  Final diagnoses:  Alcoholic intoxication without complication ([place])     MEDICATIONS GIVEN DURING THIS VISIT:  Medications  ondansetron (ZOFRAN) injection 4 mg (4 mg Intravenous Not Given 10/20/19 0145)     ED Discharge Orders    None      *Please note:  ARTEMISIA AUVIL was evaluated in Emergency Department on 10/20/2019 for the symptoms described in the history of present illness. She was evaluated  in the context of the global COVID-19 pandemic, which necessitated consideration that the patient might be at risk for infection with the SARS-CoV-2 virus that causes COVID-19. Institutional protocols and algorithms that pertain to the evaluation of patients at risk for COVID-19 are in a state of rapid change based on information released by regulatory bodies including the CDC and federal and state organizations. These policies and algorithms were followed during the patient's care in the ED.  Some ED evaluations and interventions may be delayed as a result of limited staffing during the pandemic.*  Note:  This document was prepared using Dragon voice recognition software and may include unintentional dictation errors.   Hinda Kehr, MD 10/20/19 [phone]

## 2019-10-20 ENCOUNTER — Other Ambulatory Visit: Payer: Self-pay

## 2019-10-20 LAB — HCG, QUANTITATIVE, PREGNANCY: hCG, Beta Chain, Quant, S: <1 m[IU]/mL (ref <5)

## 2019-10-20 LAB — TROPONIN I (HIGH SENSITIVITY)
Troponin I (High Sensitivity): 3 ng/L (ref <18)
Troponin I (High Sensitivity): 3 ng/L (ref <18)

## 2019-10-20 LAB — COMPREHENSIVE METABOLIC PANEL
ALT: 13 U/L (ref 0–44)
AST: 19 U/L (ref 15–41)
Albumin: 3.8 g/dL (ref 3.5–5.0)
Alkaline Phosphatase: 81 U/L (ref 38–126)
Anion gap: 10 (ref 5–15)
BUN: 10 mg/dL (ref 6–20)
CO2: 18 mmol/L — ABNORMAL LOW (ref 22–32)
Calcium: 8.8 mg/dL — ABNORMAL LOW (ref 8.9–10.3)
Chloride: 104 mmol/L (ref 98–111)
Creatinine, Ser: 0.6 mg/dL (ref 0.44–1.00)
GFR calc Af Amer: >60 mL/min (ref >60)
GFR calc non Af Amer: >60 mL/min (ref >60)
Glucose, Bld: 102 mg/dL — ABNORMAL HIGH (ref 70–99)
Potassium: 3 mmol/L — ABNORMAL LOW (ref 3.5–5.1)
Sodium: 132 mmol/L — ABNORMAL LOW (ref 135–145)
Total Bilirubin: 0.8 mg/dL (ref 0.3–1.2)
Total Protein: 6.8 g/dL (ref 6.5–8.1)

## 2019-10-20 LAB — URINE DRUG SCREEN, QUALITATIVE (ARMC ONLY)
Amphetamines, Ur Screen: POSITIVE — AB
Barbiturates, Ur Screen: NOT DETECTED
Benzodiazepine, Ur Scrn: NOT DETECTED
Cannabinoid 50 Ng, Ur ~~LOC~~: NOT DETECTED
Cocaine Metabolite,Ur ~~LOC~~: NOT DETECTED
MDMA (Ecstasy)Ur Screen: NOT DETECTED
Methadone Scn, Ur: NOT DETECTED
Opiate, Ur Screen: NOT DETECTED
Phencyclidine (PCP) Ur S: NOT DETECTED
Tricyclic, Ur Screen: NOT DETECTED

## 2019-10-20 LAB — MAGNESIUM: Magnesium: 2.3 mg/dL (ref 1.7–2.4)

## 2019-10-20 NOTE — ED Notes (Signed)
Pt asking when she can go home. Explained to pt that she is too intoxicated to leave the hospital and will have to stay here till she is more coherent. Pt expresses that the situation is not fair.

## 2019-10-20 NOTE — ED Notes (Signed)
Attempt to collect urine unsuccessful due to purewick malfunction. Will attempt again later. Person calls and asks if pt has a set of keys with her and correctly describes the keys. Pt denies that the keys at the bedside are hers.

## 2019-10-20 NOTE — ED Notes (Signed)
Bernardo Heater called to pick up pt.

## 2019-10-20 NOTE — ED Notes (Signed)
Pt mother called for an update. Advised pt was sleeping. Mother states she was just talking to her. Unsure of mother question. This RN involved in emergency traffic at this time and told mom I would call her back. Went to pt room, pt sittin gup crying and yelling stating she doesn't know why she is here. Advised pt again that she was more than 3 times the legal limit for alcohol and that she is here because someone called ems for help. Pt states she wants to go home. Pt states she is able to walk to the commode. Pt states she will call her mother to update her and I can call cousin 44. Asked Sosha to bring pants as well.

## 2019-10-20 NOTE — Discharge Instructions (Signed)
You were seen in the emergency department for alcohol intoxication.  Please drink plenty of fluids over the next couple of days and avoid drug and alcohol use.  Never drive a vehicle or operate machinery while intoxicated.  Refer to the included resource guide if you have any concerns about alcohol dependence and need some outpatient resources.

## 2019-12-15 NOTE — Progress Notes (Signed)
New patient visit   I,Rebecca Lang,acting as a scribe for [employer], FNP.,have documented all relevant documentation on the behalf of Rebecca Buffy, FNP,as directed by  Rebecca Buffy, FNP while in the presence of Rebecca Lang, Rebecca Lang.   Patient: Rebecca Lang   DOB: 17-Sep-1975   44 y.o. Female  MRN: [id number] Visit Date: 12/16/2019  Today's healthcare provider: [name] Antonette Hendricks, FNP   Allergies  Allergen Reactions  . Clindamycin/Lincomycin     Rash     Chief Complaint  Patient presents with  . Establish Care   Subjective    EH SAUSEDA is a 44 y.o. female who presents today as a new patient to establish care.  HPI   She reports she was told by some people around her that she has been having " some panic attacks" she says the only time it happens is when she ingested sugar or liquids " . She says  started years ago with mixed drink. [place] vanilla creamer also did this she reports. She reports she feels really heavy chested when she eats these things. Ice cream and un -sweet tea with fruit additive. She can eat cake and feel fine she says. She says this happened yesterday after eating ice cream.   She is going to weight loss clinic at [facility].  Labs 05/27/2019 thyroid,  Glucose was 103.   She is on Adipex she takes PRN not everyday just to help with cravings.   44 year old. Marriage not stable. Stressors increased. 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   ED visit for alcoholic intoxification on 10/19/2019. She reports she had two beers and two shots and then woke up in the hospital. Amphetamines was positive in urine drug screen. Alcohol level was 300 serum.   She reports alcohol 1- 2 glasses of wine every two weeks and just socially.  Denies any other drug use.   She was on antidepressant did not like it and she did not like the way she felt on it.   She goes for iron infusions every three months with  hematology Dr. Grayland Ormond.  Denies dizziness, lightheadedness, pre syncopal or syncopal episodes other than in 10/19/2019 with alcohol use.  Patient  denies any fever, body aches,chills, rash, chest pain, shortness of breath, nausea, vomiting, or diarrhea.    Date of Last reported pap-  Scheduled with Dr. Leafy Ro at [facility] [facility]. History of multiple tubal pregnancies.   Patient's last menstrual period was 12/15/2019.  Past Medical History:  Diagnosis Date  . Anemia   . Anxiety   . Back pain   . Depression   . Hypertrophic cardiomyopathy ([place])    Past Surgical History:  Procedure Laterality Date  . ABDOMINAL SURGERY  2004   gastric bypass  . CESAREAN SECTION    . DIAGNOSTIC LAPAROSCOPY WITH REMOVAL OF ECTOPIC PREGNANCY Right 12/03/2018   Procedure: DIAGNOSTIC LAPAROSCOPY WITH REMOVAL OF RIGHT ECTOPIC PREGNANCY AND RIGHT OVARY;  Surgeon: Benjaman Kindler, MD;  Location: [facility] ORS;  Service: Gynecology;  Laterality: Right;  . INNER EAR SURGERY    . LAPAROSCOPIC GASTRIC BYPASS  2004  . tubal rupture     Family Status  Relation Name Status  . Mother  Alive  . Father  Alive  . Sister  Alive  . MGF  (Not Specified)  . PGM  (Not Specified)  . PGF  (Not Specified)   Family History  Problem Relation Age of Onset  .  Arthritis Mother   . Other Mother        back pain w stimulator ( internal )  . Kidney disease Father   . Intestinal polyp Father   . Behavior problems Sister   . Diabetes Maternal Grandfather   . Kidney disease Paternal Grandmother   . Cancer Paternal Grandfather    Social History   Socioeconomic History  . Marital status: Single    Spouse name: Not on file  . Number of children: Not on file  . Years of education: Not on file  . Highest education level: Not on file  Occupational History  . Not on file  Tobacco Use  . Smoking status: Passive Smoke Exposure - Never Smoker  . Smokeless tobacco: Never Used  Substance and Sexual Activity  . Alcohol use: Yes     Comment: socially beer  . Drug use: No  . Sexual activity: Yes  Other Topics Concern  . Not on file  Social History Narrative  . Not on file   Social Determinants of Health   Financial Resource Strain:   . Difficulty of Paying Living Expenses:   Food Insecurity:   . Worried About Charity fundraiser in the Last Year:   . Arboriculturist in the Last Year:   Transportation Needs:   . Film/video editor (Medical):   Marland Kitchen Lack of Transportation (Non-Medical):   Physical Activity:   . Days of Exercise per Week:   . Minutes of Exercise per Session:   Stress:   . Feeling of Stress :   Social Connections:   . Frequency of Communication with Friends and Family:   . Frequency of Social Gatherings with Friends and Family:   . Attends Religious Services:   . Active Member of Clubs or Organizations:   . Attends Archivist Meetings:   Marland Kitchen Marital Status:    Outpatient Medications Prior to Visit  Medication Sig  . Cyanocobalamin (B-12 COMPLIANCE INJECTION) 1000 MCG/ML KIT Inject 1,000 mcg as directed every 14 (fourteen) days.   Marland Kitchen UNABLE TO FIND Lipotropic injections given every 2 weeks  . norethindrone-ethinyl estradiol (LOESTRIN) 1-20 MG-MCG tablet Take by mouth.  . phentermine (ADIPEX-P) 37.5 MG tablet Take 37.5 mg by mouth every morning.  . [DISCONTINUED] gabapentin (NEURONTIN) 800 MG tablet Take 1 tablet (800 mg total) by mouth at bedtime for 14 days. Take nightly for 3 days, then up to 14 days as needed (Patient not taking: Reported on 10/17/2019)   No facility-administered medications prior to visit.   Allergies  Allergen Reactions  . Clindamycin/Lincomycin     Rash     There is no immunization history on file for this patient.  Health Maintenance  Topic Date Due  . COVID-19 Vaccine (1) Never done  . HIV Screening  Never done  . TETANUS/TDAP  Never done  . PAP SMEAR-Modifier  Never done  . INFLUENZA VACCINE  02/12/2020    Patient Care Team: Doreen Beam, FNP as PCP - General (Family Medicine)  Review of Systems  Constitutional: Negative.   HENT: Negative.   Respiratory: Negative.  Negative for apnea, cough, choking, chest tightness, wheezing and stridor.   Cardiovascular: Negative.   Gastrointestinal: Negative.   Genitourinary: Negative.   Musculoskeletal: Negative.   Skin: Negative.   All other systems reviewed and are negative.   Last CBC Lab Results  Component Value Date   WBC 10.0 10/19/2019   HGB 13.1 10/19/2019   HCT 39.4  10/19/2019   MCV 90.8 10/19/2019   MCH 30.2 10/19/2019   RDW 12.8 10/19/2019   PLT 330 61/60/7371   Last metabolic panel Lab Results  Component Value Date   GLUCOSE 102 (H) 10/19/2019   NA 132 (L) 10/19/2019   K 3.0 (L) 10/19/2019   CL 104 10/19/2019   CO2 18 (L) 10/19/2019   BUN 10 10/19/2019   CREATININE 0.60 10/19/2019   GFRNONAA >60 10/19/2019   GFRAA >60 10/19/2019   CALCIUM 8.8 (L) 10/19/2019   PROT 6.8 10/19/2019   ALBUMIN 3.8 10/19/2019   BILITOT 0.8 10/19/2019   ALKPHOS 81 10/19/2019   AST 19 10/19/2019   ALT 13 10/19/2019   ANIONGAP 10 10/19/2019   Last lipids No results found for: CHOL, HDL, LDLCALC, LDLDIRECT, TRIG, CHOLHDL Last hemoglobin A1c No results found for: HGBA1C Last thyroid functions No results found for: TSH, T3TOTAL, T4TOTAL, THYROIDAB Last vitamin D No results found for: 25OHVITD2, 25OHVITD3, VD25OH Last vitamin B12 and Folate Lab Results  Component Value Date   VITAMINB12 66 (L) 02/18/2019   FOLATE 16.6 03/03/2018      Objective    BP 123/80 (BP Location: Left Arm, Patient Position: Sitting, Cuff Size: Large)   Pulse 67   Temp (!) 97.1 F (36.2 C) (Other (Comment))   Resp 16   Ht 5' 4"  (1.626 m)   Wt 197 lb (89.4 kg)   LMP 12/15/2019   SpO2 97%   BMI 33.81 kg/m  Physical Exam Vitals reviewed.  Constitutional:      General: She is not in acute distress.    Appearance: Normal appearance. She is obese. She is not ill-appearing,  toxic-appearing or diaphoretic.  HENT:     Head: Normocephalic and atraumatic.     Right Ear: External ear normal.     Left Ear: External ear normal.     Mouth/Throat:     Mouth: Mucous membranes are moist.  Eyes:     Pupils: Pupils are equal, round, and reactive to light.  Cardiovascular:     Rate and Rhythm: Normal rate and regular rhythm.     Pulses: Normal pulses.     Heart sounds: Normal heart sounds. No murmur. No friction rub. No gallop.   Pulmonary:     Effort: Pulmonary effort is normal. No respiratory distress.     Breath sounds: Normal breath sounds. No stridor. No wheezing, rhonchi or rales.  Chest:     Chest wall: No tenderness.  Abdominal:     General: There is no distension.     Palpations: Abdomen is soft.     Tenderness: There is no abdominal tenderness. There is no guarding.  Musculoskeletal:        General: Normal range of motion.  Skin:    General: Skin is warm and dry.     Capillary Refill: Capillary refill takes less than 2 seconds.  Neurological:     General: No focal deficit present.     Mental Status: She is alert and oriented to person, place, and time.     Gait: Gait normal.  Psychiatric:        Mood and Affect: Mood normal.        Behavior: Behavior normal.        Thought Content: Thought content normal.        Judgment: Judgment normal.      Depression Screen PHQ 2/9 Scores 12/16/2019  PHQ - 2 Score 2  PHQ- 9 Score 8   No  results found for any visits on 12/16/19.  Assessment & Plan      The primary encounter diagnosis was Routine medical exam. Diagnoses of Encounter for screening mammogram for malignant neoplasm of breast, Adverse food reaction, initial encounter- sugar , History of Roux-en-Y gastric bypass- around age 64, Iron deficiency anemia, unspecified iron deficiency anemia type, Screening cholesterol level, Screening for thyroid disorder, Screening for blood or protein in urine, Blood sugar increased, BMI 33.0-33.9,adult, and  Depression, major, single episode, mild ([place]) were also pertinent to this visit.   Orders Placed This Encounter  Procedures  . MM DIGITAL SCREENING BILATERAL    Standing Status:   Future    Standing Expiration Date:   12/14/2020    Order Specific Question:   Reason for Exam (SYMPTOM  OR DIAGNOSIS REQUIRED)    Answer:   screening breast cancer    Order Specific Question:   Is the patient pregnant?    Answer:   No    Order Specific Question:   Preferred imaging location?    Answer:   [facility]  . Comprehensive metabolic panel  . CBC with Differential/Platelet  . Iron, TIBC and Ferritin Panel  . Lipid panel  . TSH  . B12 and Folate Panel  . HgB A1c  . Ambulatory referral to Allergy    Referral Priority:   Routine    Referral Type:   Allergy Testing    Referral Reason:   Specialty Services Required    Requested Specialty:   Allergy    Number of Visits Requested:   1  . POCT urinalysis dipstick    An After Visit Summary was printed and given to the patient.  Discussed known black box warning for anti depression/ anxiety medication. Need to report any behavioral changes right, if any homicidal or suicidal thoughts or ideas seek medical attention right away. Call 911.    She would like to start Wellbutrin for possible panic attacks / depression.  Also agrees to go for allergy testing since her symptoms occur with liquid sugar and mild products only. She denied any testing in past or any other food allergies or any signs of anaphylaxis. Epi pen always offered. Keep liquid or chewable benadryl on hand.   Meds ordered this encounter  Medications  . buPROPion (WELLBUTRIN SR) 150 MG 12 hr tablet    Sig: Take 1 tablet (150 mg total) by mouth 2 (two) times daily. Start with 150 mg po in  am for 3 days then increased to BID as above.    Dispense:  180 tablet    Refill:  0   Return in about 3 months (around 03/17/2020), or if symptoms worsen or fail to improve and for CPE, for at any  time for any worsening symptoms, Go to Emergency room/ urgent care if worse.    Advised patient call the office or your primary care doctor for an appointment if no improvement within 72 hours or if any symptoms change or worsen at any time  Advised ER or urgent Care if after hours or on weekend. Call 911 for emergency symptoms at any time.Patinet verbalized understanding of all instructions given/reviewed and treatment plan and has no further questions or concerns at this time.     The entirety of the information documented in the History of Present Illness, Review of Systems and Physical Exam were personally obtained by me. Portions of this information were initially documented by the CMA and reviewed by me for thoroughness and accuracy.  [name] Daryel Kenneth, FNP    Rebecca Lang, [place] [phone] (phone) [phone] (fax)  [place]

## 2019-12-16 ENCOUNTER — Other Ambulatory Visit: Payer: Self-pay

## 2019-12-16 ENCOUNTER — Encounter: Payer: Self-pay | Admitting: Adult Health

## 2019-12-16 ENCOUNTER — Ambulatory Visit: Payer: Medicaid Other | Admitting: Adult Health

## 2019-12-16 VITALS — BP 123/80 | HR 67 | Temp 97.1°F | Resp 16 | Ht 64.0 in | Wt 197.0 lb

## 2019-12-16 DIAGNOSIS — S92909A Unspecified fracture of unspecified foot, initial encounter for closed fracture: Secondary | ICD-10-CM | POA: Insufficient documentation

## 2019-12-16 DIAGNOSIS — Z1389 Encounter for screening for other disorder: Secondary | ICD-10-CM | POA: Diagnosis not present

## 2019-12-16 DIAGNOSIS — Z8759 Personal history of other complications of pregnancy, childbirth and the puerperium: Secondary | ICD-10-CM

## 2019-12-16 DIAGNOSIS — Z0000 Encounter for general adult medical examination without abnormal findings: Secondary | ICD-10-CM

## 2019-12-16 DIAGNOSIS — F320 Major depressive disorder, single episode, mild: Secondary | ICD-10-CM

## 2019-12-16 DIAGNOSIS — Z6833 Body mass index (BMI) 33.0-33.9, adult: Secondary | ICD-10-CM

## 2019-12-16 DIAGNOSIS — R7309 Other abnormal glucose: Secondary | ICD-10-CM

## 2019-12-16 DIAGNOSIS — T781XXA Other adverse food reactions, not elsewhere classified, initial encounter: Secondary | ICD-10-CM | POA: Diagnosis not present

## 2019-12-16 DIAGNOSIS — Z9884 Bariatric surgery status: Secondary | ICD-10-CM

## 2019-12-16 DIAGNOSIS — Z1231 Encounter for screening mammogram for malignant neoplasm of breast: Secondary | ICD-10-CM | POA: Diagnosis not present

## 2019-12-16 DIAGNOSIS — Z1329 Encounter for screening for other suspected endocrine disorder: Secondary | ICD-10-CM

## 2019-12-16 DIAGNOSIS — N920 Excessive and frequent menstruation with regular cycle: Secondary | ICD-10-CM | POA: Insufficient documentation

## 2019-12-16 DIAGNOSIS — Z13220 Encounter for screening for lipoid disorders: Secondary | ICD-10-CM

## 2019-12-16 DIAGNOSIS — D509 Iron deficiency anemia, unspecified: Secondary | ICD-10-CM

## 2019-12-16 LAB — POCT URINALYSIS DIPSTICK
Appearance: NORMAL
Bilirubin, UA: NEGATIVE
Glucose, UA: NEGATIVE
Ketones, UA: NEGATIVE
Leukocytes, UA: NEGATIVE
Nitrite, UA: NEGATIVE
Odor: NORMAL
Protein, UA: NEGATIVE
Spec Grav, UA: 1.02 (ref 1.010–1.025)
Urobilinogen, UA: 0.2 E.U./dL
pH, UA: 6 (ref 5.0–8.0)

## 2019-12-16 MED ORDER — BUPROPION HCL ER (SR) 150 MG PO TB12: 150.0000 mg | ORAL_TABLET | Freq: Two times a day (BID) | ORAL | 0 refills | Status: DC

## 2019-12-16 NOTE — Progress Notes (Signed)
Blood in urine. Patient's last menstrual period was 12/15/2019. Can recheck at next visit.

## 2019-12-16 NOTE — Patient Instructions (Addendum)
Call to schedule your screening mammogram. Your orders have been placed for your exam.  Let our office know if you have questions, concerns, or any difficulty scheduling.  If normal results then yearly screening mammograms are recommended unless you notice  Changes in your breast then you should schedule a follow up office visit. If abnormal results  Further imaging will be warranted and sooner follow up as determined by the radiologist at the [facility].   [facility] [address] [place], [place] [address]  Main: [phone]    Health Maintenance, Female Adopting a healthy lifestyle and getting preventive care are important in promoting health and wellness. Ask your health care provider about:  The right schedule for you to have regular tests and exams.  Things you can do on your own to prevent diseases and keep yourself healthy. What should I know about diet, weight, and exercise? Eat a healthy diet   Eat a diet that includes plenty of vegetables, fruits, low-fat dairy products, and lean protein.  Do not eat a lot of foods that are high in solid fats, added sugars, or sodium. Maintain a healthy weight Body mass index (BMI) is used to identify weight problems. It estimates body fat based on height and weight. Your health care provider can help determine your BMI and help you achieve or maintain a healthy weight. Get regular exercise Get regular exercise. This is one of the most important things you can do for your health. Most adults should:  Exercise for at least 150 minutes each week. The exercise should increase your heart rate and make you sweat (moderate-intensity exercise).  Do strengthening exercises at least twice a week. This is in addition to the moderate-intensity exercise.  Spend less time sitting. Even light physical activity can be beneficial. Watch cholesterol and blood lipids Have your blood tested for lipids and  cholesterol at 42 years of age, then have this test every 5 years. Have your cholesterol levels checked more often if:  Your lipid or cholesterol levels are high.  You are older than 42 years of age.  You are at high risk for heart disease. What should I know about cancer screening? Depending on your health history and family history, you may need to have cancer screening at various ages. This may include screening for:  Breast cancer.  Cervical cancer.  Colorectal cancer.  Skin cancer.  Lung cancer. What should I know about heart disease, diabetes, and high blood pressure? Blood pressure and heart disease  High blood pressure causes heart disease and increases the risk of stroke. This is more likely to develop in people who have high blood pressure readings, are of African descent, or are overweight.  Have your blood pressure checked: ? Every 3-5 years if you are 72-45 years of age. ? Every year if you are 39 years old or older. Diabetes Have regular diabetes screenings. This checks your fasting blood sugar level. Have the screening done:  Once every three years after age 80 if you are at a normal weight and have a low risk for diabetes.  More often and at a younger age if you are overweight or have a high risk for diabetes. What should I know about preventing infection? Hepatitis B If you have a higher risk for hepatitis B, you should be screened for this virus. Talk with your health care provider to find out if you are at risk for hepatitis B infection. Hepatitis C Testing  is recommended for:  Everyone born from [birth year] through 1963.  Anyone with known risk factors for hepatitis C. Sexually transmitted infections (STIs)  Get screened for STIs, including gonorrhea and chlamydia, if: ? You are sexually active and are younger than 42 years of age. ? You are older than 42 years of age and your health care provider tells you that you are at risk for this type of  infection. ? Your sexual activity has changed since you were last screened, and you are at increased risk for chlamydia or gonorrhea. Ask your health care provider if you are at risk.  Ask your health care provider about whether you are at high risk for HIV. Your health care provider may recommend a prescription medicine to help prevent HIV infection. If you choose to take medicine to prevent HIV, you should first get tested for HIV. You should then be tested every 3 months for as long as you are taking the medicine. Pregnancy  If you are about to stop having your period (premenopausal) and you may become pregnant, seek counseling before you get pregnant.  Take 400 to 800 micrograms (mcg) of folic acid every day if you become pregnant.  Ask for birth control (contraception) if you want to prevent pregnancy. Osteoporosis and menopause Osteoporosis is a disease in which the bones lose minerals and strength with aging. This can result in bone fractures. If you are 54 years old or older, or if you are at risk for osteoporosis and fractures, ask your health care provider if you should:  Be screened for bone loss.  Take a calcium or vitamin D supplement to lower your risk of fractures.  Be given hormone replacement therapy (HRT) to treat symptoms of menopause. Follow these instructions at home: Lifestyle  Do not use any products that contain nicotine or tobacco, such as cigarettes, e-cigarettes, and chewing tobacco. If you need help quitting, ask your health care provider.  Do not use street drugs.  Do not share needles.  Ask your health care provider for help if you need support or information about quitting drugs. Alcohol use  Do not drink alcohol if: ? Your health care provider tells you not to drink. ? You are pregnant, may be pregnant, or are planning to become pregnant.  If you drink alcohol: ? Limit how much you use to 0-1 drink a day. ? Limit intake if you are  breastfeeding.  Be aware of how much alcohol is in your drink. In the [place] one drink equals one 12 oz bottle of beer (355 mL), one 5 oz glass of wine (148 mL), or one 1 oz glass of hard liquor (44 mL). General instructions  Schedule regular health, dental, and eye exams.  Stay current with your vaccines.  Tell your health care provider if: ? You often feel depressed. ? You have ever been abused or do not feel safe at home. Summary  Adopting a healthy lifestyle and getting preventive care are important in promoting health and wellness.  Follow your health care provider's instructions about healthy diet, exercising, and getting tested or screened for diseases.  Follow your health care provider's instructions on monitoring your cholesterol and blood pressure. This information is not intended to replace advice given to you by your health care provider. Make sure you discuss any questions you have with your health care provider. Document Revised: 06/23/2018 Document Reviewed: 06/23/2018 Elsevier Patient Education  [place].  Bupropion tablets (Depression/Mood Disorders) What is this medicine? BUPROPION (  byoo PROE pee on) is used to treat depression. This medicine may be used for other purposes; ask your health care provider or pharmacist if you have questions. COMMON BRAND NAME(S): Wellbutrin What should I tell my health care provider before I take this medicine? They need to know if you have any of these conditions:  an eating disorder, such as anorexia or bulimia  bipolar disorder or psychosis  diabetes or high blood sugar, treated with medication  glaucoma  heart disease, previous heart attack, or irregular heart beat  head injury or brain tumor  high blood pressure  kidney or liver disease  seizures  suicidal thoughts or a previous suicide attempt  Tourette's syndrome  weight loss  an unusual or allergic reaction to bupropion, other medicines, foods,  dyes, or preservatives  breast-feeding  pregnant or trying to become pregnant How should I use this medicine? Take this medicine by mouth with a glass of water. Follow the directions on the prescription label. You can take it with or without food. If it upsets your stomach, take it with food. Take your medicine at regular intervals. Do not take your medicine more often than directed. Do not stop taking this medicine suddenly except upon the advice of your doctor. Stopping this medicine too quickly may cause serious side effects or your condition may worsen. A special MedGuide will be given to you by the pharmacist with each prescription and refill. Be sure to read this information carefully each time. Talk to your pediatrician regarding the use of this medicine in children. Special care may be needed. Overdosage: If you think you have taken too much of this medicine contact a poison control center or emergency room at once. NOTE: This medicine is only for you. Do not share this medicine with others. What if I miss a dose? If you miss a dose, take it as soon as you can. If it is less than four hours to your next dose, take only that dose and skip the missed dose. Do not take double or extra doses. What may interact with this medicine? Do not take this medicine with any of the following medications:  linezolid  MAOIs like Azilect, Carbex, Eldepryl, Marplan, Nardil, and Parnate  methylene blue (injected into a vein)  other medicines that contain bupropion like Zyban This medicine may also interact with the following medications:  alcohol  certain medicines for anxiety or sleep  certain medicines for blood pressure like metoprolol, propranolol  certain medicines for depression or psychotic disturbances  certain medicines for HIV or AIDS like efavirenz, lopinavir, nelfinavir, ritonavir  certain medicines for irregular heart beat like propafenone, flecainide  certain medicines for  Parkinson's disease like amantadine, levodopa  certain medicines for seizures like carbamazepine, phenytoin, phenobarbital  cimetidine  clopidogrel  cyclophosphamide  digoxin  furazolidone  isoniazid  nicotine  orphenadrine  procarbazine  steroid medicines like prednisone or cortisone  stimulant medicines for attention disorders, weight loss, or to stay awake  tamoxifen  theophylline  thiotepa  ticlopidine  tramadol  warfarin This list may not describe all possible interactions. Give your health care provider a list of all the medicines, herbs, non-prescription drugs, or dietary supplements you use. Also tell them if you smoke, drink alcohol, or use illegal drugs. Some items may interact with your medicine. What should I watch for while using this medicine? Tell your doctor if your symptoms do not get better or if they get worse. Visit your doctor or healthcare provider for regular  checks on your progress. Because it may take several weeks to see the full effects of this medicine, it is important to continue your treatment as prescribed by your doctor. This medicine may cause serious skin reactions. They can happen weeks to months after starting the medicine. Contact your healthcare provider right away if you notice fevers or flu-like symptoms with a rash. The rash may be red or purple and then turn into blisters or peeling of the skin. Or, you might notice a red rash with swelling of the face, lips or lymph nodes in your neck or under your arms. Patients and their families should watch out for new or worsening thoughts of suicide or depression. Also watch out for sudden changes in feelings such as feeling anxious, agitated, panicky, irritable, hostile, aggressive, impulsive, severely restless, overly excited and hyperactive, or not being able to sleep. If this happens, especially at the beginning of treatment or after a change in dose, call your healthcare provider. Avoid  alcoholic drinks while taking this medicine. Drinking excessive alcoholic beverages, using sleeping or anxiety medicines, or quickly stopping the use of these agents while taking this medicine may increase your risk for a seizure. Do not drive or use heavy machinery until you know how this medicine affects you. This medicine can impair your ability to perform these tasks. Do not take this medicine close to bedtime. It may prevent you from sleeping. Your mouth may get dry. Chewing sugarless gum or sucking hard candy, and drinking plenty of water may help. Contact your doctor if the problem does not go away or is severe. What side effects may I notice from receiving this medicine? Side effects that you should report to your doctor or health care professional as soon as possible:  allergic reactions like skin rash, itching or hives, swelling of the face, lips, or tongue  breathing problems  changes in vision  confusion  elevated mood, decreased need for sleep, racing thoughts, impulsive behavior  fast or irregular heartbeat  hallucinations, loss of contact with reality  increased blood pressure  rash, fever, and swollen lymph nodes  redness, blistering, peeling, or loosening of the skin, including inside the mouth  seizures  suicidal thoughts or other mood changes  unusually weak or tired  vomiting Side effects that usually do not require medical attention (report to your doctor or health care professional if they continue or are bothersome):  constipation  headache  loss of appetite  nausea  tremors  weight loss This list may not describe all possible side effects. Call your doctor for medical advice about side effects. You may report side effects to FDA at 1-800-FDA-1088. Where should I keep my medicine? Keep out of the reach of children. Store at room temperature between 20 and 25 degrees C (68 and 77 degrees F), away from direct sunlight and moisture. Keep tightly  closed. Throw away any unused medicine after the expiration date. NOTE: This sheet is a summary. It may not cover all possible information. If you have questions about this medicine, talk to your doctor, pharmacist, or health care provider.  2020 Elsevier/Gold Standard (2018-09-23 14:02:47)

## 2019-12-17 LAB — CBC WITH DIFFERENTIAL/PLATELET
Basophils Absolute: 0 10*3/uL (ref 0.0–0.2)
Basos: 0 %
EOS (ABSOLUTE): 0 10*3/uL (ref 0.0–0.4)
Eos: 0 %
Hematocrit: 39.3 % (ref 34.0–46.6)
Hemoglobin: 12.8 g/dL (ref 11.1–15.9)
Immature Grans (Abs): 0 10*3/uL (ref 0.0–0.1)
Immature Granulocytes: 0 %
Lymphocytes Absolute: 1.7 10*3/uL (ref 0.7–3.1)
Lymphs: 24 %
MCH: 31 pg (ref 26.6–33.0)
MCHC: 32.6 g/dL (ref 31.5–35.7)
MCV: 95 fL (ref 79–97)
Monocytes Absolute: 0.5 10*3/uL (ref 0.1–0.9)
Monocytes: 8 %
Neutrophils Absolute: 4.7 10*3/uL (ref 1.4–7.0)
Neutrophils: 68 %
Platelets: 306 10*3/uL (ref 150–450)
RBC: 4.13 x10E6/uL (ref 3.77–5.28)
RDW: 12.7 % (ref 11.7–15.4)
WBC: 7.1 10*3/uL (ref 3.4–10.8)

## 2019-12-17 LAB — B12 AND FOLATE PANEL
Folate: 3.4 ng/mL (ref >3.0)
Vitamin B-12: 699 pg/mL (ref 232–1245)

## 2019-12-17 LAB — IRON,TIBC AND FERRITIN PANEL
Ferritin: 64 ng/mL (ref 15–150)
Iron Saturation: 23 % (ref 15–55)
Iron: 68 ug/dL (ref 27–159)
Total Iron Binding Capacity: 292 ug/dL (ref 250–450)
UIBC: 224 ug/dL (ref 131–425)

## 2019-12-17 LAB — COMPREHENSIVE METABOLIC PANEL
ALT: 11 IU/L (ref 0–32)
AST: 14 IU/L (ref 0–40)
Albumin/Globulin Ratio: 1.6 (ref 1.2–2.2)
Albumin: 4.1 g/dL (ref 3.8–4.8)
Alkaline Phosphatase: 95 IU/L (ref 48–121)
BUN/Creatinine Ratio: 18 (ref 9–23)
BUN: 11 mg/dL (ref 6–24)
Bilirubin Total: 0.3 mg/dL (ref 0.0–1.2)
CO2: 21 mmol/L (ref 20–29)
Calcium: 9 mg/dL (ref 8.7–10.2)
Chloride: 111 mmol/L — ABNORMAL HIGH (ref 96–106)
Creatinine, Ser: 0.61 mg/dL (ref 0.57–1.00)
GFR calc Af Amer: 130 mL/min/{1.73_m2} (ref >59)
GFR calc non Af Amer: 113 mL/min/{1.73_m2} (ref >59)
Globulin, Total: 2.6 g/dL (ref 1.5–4.5)
Glucose: 90 mg/dL (ref 65–99)
Potassium: 4.3 mmol/L (ref 3.5–5.2)
Sodium: 142 mmol/L (ref 134–144)
Total Protein: 6.7 g/dL (ref 6.0–8.5)

## 2019-12-17 LAB — LIPID PANEL
Chol/HDL Ratio: 2.3 ratio (ref 0.0–4.4)
Cholesterol, Total: 151 mg/dL (ref 100–199)
HDL: 66 mg/dL (ref >39)
LDL Chol Calc (NIH): 74 mg/dL (ref 0–99)
Triglycerides: 49 mg/dL (ref 0–149)
VLDL Cholesterol Cal: 11 mg/dL (ref 5–40)

## 2019-12-17 LAB — HEMOGLOBIN A1C
Est. average glucose Bld gHb Est-mCnc: 91 mg/dL
Hgb A1c MFr Bld: 4.8 % (ref 4.8–5.6)

## 2019-12-17 LAB — TSH: TSH: 1.56 u[IU]/mL (ref 0.450–4.500)

## 2019-12-19 NOTE — Progress Notes (Signed)
CBC and CMP is ok within normal limits without anemia or signs of infection and kidney/ liver function and electrolytes ok.  Hemoglobin A1C within normal limits not diabetic.  TSH for thyroid within normal limits. B12 and folate within normal limits.  Cholesterol within normal limits.

## 2019-12-21 NOTE — Progress Notes (Signed)
Established patient visit   Patient: Rebecca Lang   DOB: [birth year]   44 y.o. Female  MRN: [id number] Visit Date: 12/22/2019  Today's healthcare provider: Trinna Post, PA-C   Chief Complaint  Patient presents with  . Rash  I,Adriana M Pollak,acting as a scribe for [employer], PA-C.,have documented all relevant documentation on the behalf of Trinna Post, PA-C,as directed by  Trinna Post, PA-C while in the presence of Trinna Post, PA-C.  Subjective    Rash This is a new problem. The current episode started in the past 7 days. The problem has been gradually worsening since onset. The affected locations include the right shoulder. The rash is characterized by blistering, dryness, itchiness, redness and burning. She was exposed to nothing. Pertinent negatives include no congestion, cough, diarrhea, fatigue, fever or shortness of breath. Past treatments include nothing. The treatment provided no relief.  Patient reports husband was diagnosis with MRSA one month ago.     Medications: Outpatient Medications Prior to Visit  Medication Sig  . buPROPion (WELLBUTRIN SR) 150 MG 12 hr tablet Take 1 tablet (150 mg total) by mouth 2 (two) times daily. Start with 150 mg po in  am for 3 days then increased to BID as above.  . Cyanocobalamin (B-12 COMPLIANCE INJECTION) 1000 MCG/ML KIT Inject 1,000 mcg as directed every 14 (fourteen) days.   . norethindrone-ethinyl estradiol (LOESTRIN) 1-20 MG-MCG tablet Take by mouth.  . phentermine (ADIPEX-P) 37.5 MG tablet Take 37.5 mg by mouth every morning.  Marland Kitchen UNABLE TO FIND Lipotropic injections given every 2 weeks   No facility-administered medications prior to visit.    Review of Systems  Constitutional: Negative.  Negative for fatigue and fever.  HENT: Negative for congestion.   Respiratory: Negative.  Negative for cough and shortness of breath.   Cardiovascular: Negative.   Gastrointestinal: Negative for diarrhea.   Skin: Positive for rash.      Objective    BP 134/84 (BP Location: Left Arm, Patient Position: Sitting, Cuff Size: Normal)   Pulse 69   Temp (!) 96.8 F (36 C) (Temporal)   Wt 193 lb 6.4 oz (87.7 kg)   LMP 12/15/2019   SpO2 99%   BMI 33.20 kg/m    Physical Exam Constitutional:      Appearance: Normal appearance.  Skin:    General: Skin is warm and dry.     Findings: Rash present.       Neurological:     General: No focal deficit present.     Mental Status: She is alert and oriented to person, place, and time.  Psychiatric:        Mood and Affect: Mood normal.        Behavior: Behavior normal.       No results found for any visits on 12/22/19.  Assessment & Plan    1. Rash Patient has possible shingles on her right shoulder that is going away. Patient stated that the rash was blistering but the blisters went out now its just red, itchy and spread. Patient was advised that she could be prescribed something for the shingles but it is in healing stage and probably want help. Patient is OK observing rash for now.    Return if symptoms worsen or fail to improve.      ITrinna Post, PA-C, have reviewed all documentation for this visit. The documentation on 12/22/19 for the exam, diagnosis, procedures, and orders are  all accurate and complete.    Paulene Floor  [facility] [phone] (phone) [phone] (fax)  [place]

## 2019-12-22 ENCOUNTER — Ambulatory Visit: Payer: Medicaid Other | Admitting: Physician Assistant

## 2019-12-22 ENCOUNTER — Encounter: Payer: Self-pay | Admitting: Physician Assistant

## 2019-12-22 ENCOUNTER — Other Ambulatory Visit: Payer: Self-pay

## 2019-12-22 VITALS — BP 134/84 | HR 69 | Temp 96.8°F | Wt 193.4 lb

## 2019-12-22 DIAGNOSIS — R21 Rash and other nonspecific skin eruption: Secondary | ICD-10-CM

## 2019-12-22 NOTE — Patient Instructions (Signed)
Shingles  Shingles is an infection. It gives you a painful skin rash and blisters that have fluid in them. Shingles is caused by the same germ (virus) that causes chickenpox. Shingles only happens in people who:  Have had chickenpox.  Have been given a shot of medicine (vaccine) to protect against chickenpox. Shingles is rare in this group. The first symptoms of shingles may be itching, tingling, or pain in an area on your skin. A rash will show on your skin a few days or weeks later. The rash is likely to be on one side of your body. The rash usually has a shape like a belt or a band. Over time, the rash turns into fluid-filled blisters. The blisters will break open, change into scabs, and dry up. Medicines may:  Help with pain and itching.  Help you get better sooner.  Help to prevent long-term problems. Follow these instructions at home: Medicines  Take over-the-counter and prescription medicines only as told by your doctor.  Put on an anti-itch cream or numbing cream where you have a rash, blisters, or scabs. Do this as told by your doctor. Helping with itching and discomfort   Put cold, wet cloths (cold compresses) on the area of the rash or blisters as told by your doctor.  Cool baths can help you feel better. Try adding baking soda or dry oatmeal to the water to lessen itching. Do not bathe in hot water. Blister and rash care  Keep your rash covered with a loose bandage (dressing).  Wear loose clothing that does not rub on your rash.  Keep your rash and blisters clean. To do this, wash the area with mild soap and cool water as told by your doctor.  Check your rash every day for signs of infection. Check for: ? More redness, swelling, or pain. ? Fluid or blood. ? Warmth. ? Pus or a bad smell.  Do not scratch your rash. Do not pick at your blisters. To help you to not scratch: ? Keep your fingernails clean and cut short. ? Wear gloves or mittens when you sleep, if  scratching is a problem. General instructions  Rest as told by your doctor.  Keep all follow-up visits as told by your doctor. This is important.  Wash your hands often with soap and water. If soap and water are not available, use hand sanitizer. Doing this lowers your chance of getting a skin infection caused by germs (bacteria).  Your infection can cause chickenpox in people who have never had chickenpox or never got a shot of chickenpox vaccine. If you have blisters that did not change into scabs yet, try not to touch other people or be around other people, especially: ? Babies. ? Pregnant women. ? Children who have areas of red, itchy, or rough skin (eczema). ? Very old people who have transplants. ? People who have a long-term (chronic) sickness, like cancer or AIDS. Contact a doctor if:  Your pain does not get better with medicine.  Your pain does not get better after the rash heals.  You have any signs of infection in the rash area. These signs include: ? More redness, swelling, or pain around the rash. ? Fluid or blood coming from the rash. ? The rash area feeling warm to the touch. ? Pus or a bad smell coming from the rash. Get help right away if:  The rash is on your face or nose.  You have pain in your face or pain by   your eye.  You lose feeling on one side of your face.  You have trouble seeing.  You have ear pain, or you have ringing in your ear.  You have a loss of taste.  Your condition gets worse. Summary  Shingles gives you a painful skin rash and blisters that have fluid in them.  Shingles is an infection. It is caused by the same germ (virus) that causes chickenpox.  Keep your rash covered with a loose bandage (dressing). Wear loose clothing that does not rub on your rash.  If you have blisters that did not change into scabs yet, try not to touch other people or be around people. This information is not intended to replace advice given to you by  your health care provider. Make sure you discuss any questions you have with your health care provider. Document Revised: 10/22/2018 Document Reviewed: 03/04/2017 Elsevier Patient Education  2020 Elsevier Inc.  

## 2019-12-23 ENCOUNTER — Ambulatory Visit
Admission: RE | Admit: 2019-12-23 | Discharge: 2019-12-23 | Disposition: A | Discharge status: Home / Self Care | Payer: Medicaid Other | Source: Ambulatory Visit | Attending: Adult Health | Admitting: Adult Health

## 2019-12-23 ENCOUNTER — Other Ambulatory Visit: Payer: Self-pay | Admitting: Adult Health

## 2019-12-23 DIAGNOSIS — Z1231 Encounter for screening mammogram for malignant neoplasm of breast: Secondary | ICD-10-CM

## 2019-12-23 DIAGNOSIS — N6452 Nipple discharge: Secondary | ICD-10-CM

## 2019-12-27 ENCOUNTER — Ambulatory Visit
Admission: RE | Admit: 2019-12-27 | Discharge: 2019-12-27 | Disposition: A | Discharge status: Home / Self Care | Payer: Medicaid Other | Source: Ambulatory Visit | Attending: Adult Health | Admitting: Adult Health

## 2019-12-27 ENCOUNTER — Other Ambulatory Visit: Payer: Self-pay | Admitting: Adult Health

## 2019-12-27 DIAGNOSIS — N6452 Nipple discharge: Secondary | ICD-10-CM

## 2019-12-29 NOTE — Progress Notes (Signed)
At initial visit, no nipple discharge was reported to provider , screening mammogram was ordered. Has she been seeing OBGYN for this possibly ?If this is persistent I recommend a follow up office visit to discuss either with GYN or PCP to discuss any further work up.

## 2019-12-30 ENCOUNTER — Ambulatory Visit: Payer: Self-pay | Admitting: *Deleted

## 2019-12-30 ENCOUNTER — Telehealth: Payer: Self-pay

## 2019-12-30 NOTE — Telephone Encounter (Signed)
lmtcb -Okay for [employer] triage to advise if patient returns call back. KW

## 2019-12-30 NOTE — Telephone Encounter (Signed)
Pt called in c/o blood sugar low she thinks. It's  40 now.  I had an appt a month ago but could not get in to see Rebecca Lang.   I feel like I'm having a panic attack  When I drink something with sugar in it that is real sweet this happens.  I get shaky, sweaty and just don't feel good.  I also feel very tired afterwards.     This started 17 years ago but lately has been happening more and more.    Never been diagnosed with diabetes.    She has talked with Laverna Peace about this problem and was given a glucose monitor to check her sugar when she has these episodes.   Today is the first episode she has had since seeing [facility].   They happen so sporadically.    I feel really tired and drained.  I've not had any sugar today so I'm not sure what brought this episode on.    My sugar is 97 on average.  Today 86 now.  All my blood work is normal when it's been checked.      I've lost weight and now it seems this episode is happening more often. I've talked with Laverna Peace and she is the one that gave me the glucose machine.  "My average is 97". For breakfast I had a shake.  I had a [employer]life drink.    For lunch I had shrimp salad.    I educated her on eating protein because that helps to stabilize glucose levels.  I explained how carbohydrates also cause the glucose to elevate but without protein to maintain a stable glucose her glucose then falls giving you the symptoms you are experiencing if that's what is happening with you.  I encouraged her to pay attention to her protein intake and to off set carbs with protein.   "I don't eat sugar so I don't know why this happened today".   I'm having cold sweats and was shaking.   I just ate a cookie chocolate one.  I feel better but I'm still weak.   It's so sporadic.  I saw Sharyn Lull on June 4th.  I let her know if eating something helps resolve the episode like the chocolate cookie did as mentioned above to eat something when she has these  episodes until seeing [facility].  Continue checking your glucose.  I instructed her since it was the weekend coming up that if she had an episode where she became very dizzy, passes out or feels like she may pass out or if the episode does not pass to go to the ED.  She was agreeable to this.   I did not have anything that would cause this today.    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  I was looking for an appt with Sharyn Lull the week after she returns and the line disconnected.   No protocol used because could not find one applicable to this situation.  Pt called back so the agent scheduled her since I was done with triaging her.  She is scheduled for 01/18/2020 at 10:40 with [facility].       Reason for Disposition . Reason: professional judgment or information in Reference    Pt out of town all next week.   Appt made for 01/18/2020 with Laverna Peace who is familiar with these episodes.  They have been occurring for many  years but more frequently lately.  Answer Assessment - Initial Assessment Questions 1. REASON FOR CALL: "What is your main concern right now?"     See documentation for complete details. I'm having these episodes where I get shaky and sweaty.  I'm not diabetic.   My glucose is 63. 2. ONSET: "When did the episodes start?"     17 years ago but seem to happen more and more frequently. 3. SEVERITY: "How bad is the shakiness?"     After eating a chocolate cookie my mother gave me I feel much better.  Just tired and drained now. 4. OTHER SYMPTOMS: "Do your child have any other new symptoms?"     N/A 5. FEVER: "Does your child have a fever?" If so, ask: "What is it, how was it measured, and when did it start?"     N/A 6. CHILD'S APPEARANCE: "How sick is your child acting?" " What is he doing right now?" If asleep, ask: "How was he acting before he went to sleep?"     N/A 7. CAUSE: "What do you think is causing the episodes?"     I  thought it was from sugar but I cut out sugar from my diet so I don't know. 8. TREATMENT: "What have you done so far to try to make this better?"      I ate a chocolate cookie my mother gave me and it helped the symptoms just a few minutes ago.  - Author's note: IAQ's are intended for training purposes and not meant to be required on every  call.  Protocols used: NO GUIDELINE AVAILABLE-P-AH

## 2019-12-30 NOTE — Telephone Encounter (Signed)
See below. KW 

## 2019-12-30 NOTE — Telephone Encounter (Signed)
-----   Message from Berniece Pap, FNP sent at 12/29/2019  8:47 AM EDT ----- At initial visit, no nipple discharge was reported to provider , screening mammogram was ordered. Has she been seeing OBGYN for this possibly ?If this is persistent I recommend a follow up office visit to discuss either with GYN or PCP to discuss any further work up.

## 2019-12-30 NOTE — Telephone Encounter (Signed)
She should go to the ER over the weekend if persists or any emergent signs. Ok to place her in an opening with another provider next week that would be great. ALso had below question for patient- see below message. Unable to reach patient.    Message from Berniece Pap, FNP sent at 12/29/2019  8:47 AM EDT ----- At initial visit, no nipple discharge was reported to provider , screening mammogram was ordered. Has she been seeing OBGYN for this possibly ?If this is persistent I recommend a follow up office visit to discuss either with GYN or PCP to discuss any further work up.

## 2020-01-02 NOTE — Telephone Encounter (Signed)
Patient called and advised of the below message from Marvell Fuller, FNP. Patient says she sees a GYN and will follow up with them.

## 2020-01-02 NOTE — Telephone Encounter (Signed)
Attempted to call patient, mailbox is full. 

## 2020-01-02 NOTE — Telephone Encounter (Signed)
Attempted to contact patient again since initial call on 12/20/19 with no answer, if patient returns call please have pEC triage, triage patient. KW

## 2020-01-02 NOTE — Telephone Encounter (Signed)
Attempted to reach pt. Voice mailbox is full, unable to leave message.

## 2020-01-03 NOTE — Telephone Encounter (Signed)
FYI see patient response below. KW 

## 2020-01-03 NOTE — Telephone Encounter (Signed)
I called pt and gave her the message from Marvell Fuller, FNP dated 12/30/2019 at 5:05 PM.   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  In response to Michelle's note about the nipple discharge;  "I have been having this".  "The doctor I was seeing before Marcelino Duster let me know it was not anything to be concerned with".  "When I went to get my screening mammogram the technician asked if I was having any nipple discharge".   "I told her yes but it was not anything I needed to be concerned with".   "The technician then said I had to have a diagnostic mammogram".  "So the diagnostic mammogram was done and everything was fine".     "I didn't know answering yes to the nipple discharge question was going to cause so much trouble since this is not a new symptom".  "So that's what the nipple discharge thing is all about".   "I've not had anymore episodes of feeling like my blood sugar is low since I called into the office the other day". (12/30/2019).   "they only happen sporadically".      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  I confirmed her appt for 01/18/2020 and instructed her to go to the ED for any further episodes.   She verbalized understanding and was agreeable with the plan.

## 2020-01-10 NOTE — Telephone Encounter (Signed)
Reviewed

## 2020-01-18 ENCOUNTER — Ambulatory Visit (INDEPENDENT_AMBULATORY_CARE_PROVIDER_SITE_OTHER): Payer: Medicaid Other | Admitting: Adult Health

## 2020-01-18 ENCOUNTER — Other Ambulatory Visit: Payer: Self-pay

## 2020-01-18 ENCOUNTER — Encounter: Payer: Self-pay | Admitting: Adult Health

## 2020-01-18 VITALS — BP 118/73 | HR 66 | Temp 97.1°F | Wt 195.4 lb

## 2020-01-18 DIAGNOSIS — Z7289 Other problems related to lifestyle: Secondary | ICD-10-CM | POA: Diagnosis not present

## 2020-01-18 DIAGNOSIS — Z789 Other specified health status: Secondary | ICD-10-CM | POA: Insufficient documentation

## 2020-01-18 DIAGNOSIS — E162 Hypoglycemia, unspecified: Secondary | ICD-10-CM

## 2020-01-18 DIAGNOSIS — N643 Galactorrhea not associated with childbirth: Secondary | ICD-10-CM | POA: Diagnosis not present

## 2020-01-18 DIAGNOSIS — E559 Vitamin D deficiency, unspecified: Secondary | ICD-10-CM | POA: Diagnosis not present

## 2020-01-18 DIAGNOSIS — R42 Dizziness and giddiness: Secondary | ICD-10-CM

## 2020-01-18 MED ORDER — BLOOD GLUCOSE METER KIT: PACK | 0 refills | Status: DC

## 2020-01-18 NOTE — Patient Instructions (Addendum)
Galactorrhea Galactorrhea is the flow of a milky fluid (discharge) from the breast. It is different from normal milk in nursing mothers. The fluid can be white, yellow, or green. This condition can be caused by many things. Most cases are not serious and do not require treatment. Watch your condition to make sure it goes away. Follow these instructions at home: Breast care   Watch your condition for any changes.  Do not squeeze your breasts or nipples.  Avoid touching your breasts when you are having sex.  Perform a breast self-exam once a month.  Avoid clothes that rub on your nipples.  Use breast pads to absorb the fluid.  Wear a support bra or a breast binder. General instructions  Take over-the-counter and prescription medicines only as told by your doctor.  Keep all follow-up visits as told by your doctor. This is important. Contact a doctor if you:  Have hot flashes.  Have vaginal dryness.  Have no desire for sex.  Stop having periods, or have periods that are irregular or far apart.  Have headaches.  Cannot see well. Get help right away if:  Your breast discharge is bloody or yellowish white (puslike).  You have breast pain.  You feel a lump in your breast.  Your breast shows wrinkling or dimpling.  Your breast becomes red and swollen. Summary  Galactorrhea is the flow of a milky fluid (discharge) from the breast.  This condition may be caused by many things, but it is not usually serious.  Watch your condition carefully to make sure that it goes away.  Get help right away if the fluid is bloody or yellowish white, or if you have a lump, pain, or skin changes on your breast. This information is not intended to replace advice given to you by your health care provider. Make sure you discuss any questions you have with your health care provider. Document Revised: 07/13/2017 Document Reviewed: 07/13/2017 Elsevier Patient Education  [place].  Hypoglycemia Hypoglycemia is when the sugar (glucose) level in your blood is too low. Signs of low blood sugar may include:  Feeling: ? Hungry. ? Worried or nervous (anxious). ? Sweaty and clammy. ? Confused. ? Dizzy. ? Sleepy. ? Sick to your stomach (nauseous).  Having: ? A fast heartbeat. ? A headache. ? A change in your vision. ? Tingling or no feeling (numbness) around your mouth, lips, or tongue. ? Jerky movements that you cannot control (seizure).  Having trouble with: ? Moving (coordination). ? Sleeping. ? Passing out (fainting). ? Getting upset easily (irritability). Low blood sugar can happen to people who have diabetes and people who do not have diabetes. Low blood sugar can happen quickly, and it can be an emergency. Treating low blood sugar Low blood sugar is often treated by eating or drinking something sugary right away, such as:  Fruit juice, 4-6 oz (120-150 mL).  Regular soda (not diet soda), 4-6 oz (120-150 mL).  Low-fat milk, 4 oz (120 mL).  Several pieces of hard candy.  Sugar or honey, 1 Tbsp (15 mL). Treating low blood sugar if you have diabetes If you can think clearly and swallow safely, follow the 15:15 rule:  Take 15 grams of a fast-acting carb (carbohydrate). Talk with your doctor about how much you should take.  Always keep a source of fast-acting carb with you, such as: ? Sugar tablets (glucose pills). Take 3-4 pills. ? 6-8 pieces of hard candy. ? 4-6 oz (120-150 mL) of fruit  juice. ? 4-6 oz (120-150 mL) of regular (not diet) soda. ? 1 Tbsp (15 mL) honey or sugar.  Check your blood sugar 15 minutes after you take the carb.  If your blood sugar is still at or below 70 mg/dL (3.9 mmol/L), take 15 grams of a carb again.  If your blood sugar does not go above 70 mg/dL (3.9 mmol/L) after 3 tries, get help right away.  After your blood sugar goes back to normal, eat a meal or a snack within 1 hour.  Treating very low blood  sugar If your blood sugar is at or below 54 mg/dL (3 mmol/L), you have very low blood sugar (severe hypoglycemia). This may also cause:  Passing out.  Jerky movements you cannot control (seizure).  Losing consciousness (coma). This is an emergency. Do not wait to see if the symptoms will go away. Get medical help right away. Call your local emergency services (911 in the [place] Do not drive yourself to the hospital. If you have very low blood sugar and you cannot eat or drink, you may need a glucagon shot (injection). A family member or friend should learn how to check your blood sugar and how to give you a glucagon shot. Ask your doctor if you need to have a glucagon shot kit at home. Follow these instructions at home: General instructions  Take over-the-counter and prescription medicines only as told by your doctor.  Stay aware of your blood sugar as told by your doctor.  Limit alcohol intake to no more than 1 drink a day for nonpregnant women and 2 drinks a day for men. One drink equals 12 oz of beer (355 mL), 5 oz of wine (148 mL), or 1 oz of hard liquor (44 mL).  Keep all follow-up visits as told by your doctor. This is important. If you have diabetes:   Follow your diabetes care plan as told by your doctor. Make sure you: ? Know the signs of low blood sugar. ? Take your medicines as told. ? Follow your exercise and meal plan. ? Eat on time. Do not skip meals. ? Check your blood sugar as often as told by your doctor. Always check it before and after exercise. ? Follow your sick day plan when you cannot eat or drink normally. Make this plan ahead of time with your doctor.  Share your diabetes care plan with: ? Your work or school. ? People you live with.  Check your pee (urine) for ketones: ? When you are sick. ? As told by your doctor.  Carry a card or wear jewelry that says you have diabetes. Contact a doctor if:  You have trouble keeping your blood sugar in your  target range.  You have low blood sugar often. Get help right away if:  You still have symptoms after you eat or drink something sugary.  Your blood sugar is at or below 54 mg/dL (3 mmol/L).  You have jerky movements that you cannot control.  You pass out. These symptoms may be an emergency. Do not wait to see if the symptoms will go away. Get medical help right away. Call your local emergency services (911 in the [place] Do not drive yourself to the hospital. Summary  Hypoglycemia happens when the level of sugar (glucose) in your blood is too low.  Low blood sugar can happen to people who have diabetes and people who do not have diabetes. Low blood sugar can happen quickly, and it can be an  emergency.  Make sure you know the signs of low blood sugar and know how to treat it.  Always keep a source of sugar (fast-acting carb) with you to treat low blood sugar. This information is not intended to replace advice given to you by your health care provider. Make sure you discuss any questions you have with your health care provider. Document Revised: 10/21/2018 Document Reviewed: 08/03/2015 Elsevier Patient Education  2020 [employer].

## 2020-01-18 NOTE — Progress Notes (Signed)
Established patient visit   Patient: Rebecca Lang   DOB: [birth year]   44 y.o. Female  MRN: [id number] Visit Date: 01/18/2020  Today's healthcare provider: [name], FNP   Chief Complaint  Patient presents with  . Hypoglycemia   Mertie Moores as a scribe for [employer], FNP.,have documented all relevant documentation on the behalf of [name], FNP,as directed by  [name], FNP while in the presence of [name], [place].  Subjective    HPI  Follow up for Hypoglycemia:  The patient was last seen for this 1 months ago. Changes made at last visit include patient advised to check FBS. Patient reports have some low blood sugars including one that was 62. She reports normally her FBS is around 97.  She reports she gets a feeling of having blurry vision, cold sweats, shakings and for a day or so afterwards she feels " like crap for a day". Denies any syncope. Feels lightheaded at times.   She reports this happened last on around 12/30/19 and around a week ago. All incidents have been when eating sugary foods she reports. She denies any throat swelling since last episode. She gets very drained after these episodes.   No LMP recorded. 2020/02/11   She has passed out before once with alcohol use. See ED note.   She is going for allergy testing to be sure foods are not causing this.  She has decreased alcohol intake. Denies any heavy drinking.   She reports good compliance with treatment. She feels that condition is Unchanged. She is not having side effects.   History of hormonal headaches in the past. 4 headaches in the past 10-15 years now she reports.  Menstrual cycles are irregular length and heavy however regular for dates monthly.  She had her mammogram and provider noted on order for diagnosis she has gray  black nipple discharge intermittent , she has dripping of them in the show. She has had  this for years. She did not tell provider this when ordered screeni5ng mammogram. She had been told it was no big deal. She has had since her 42 year old was 42 years old. She also has a 42 year old.  She did breast feed.  Mammogram and [place] ok see imaging.    Patient  denies any fever, body aches,chills, rash, chest pain, shortness of breath, nausea, vomiting, or diarrhea.  Denies dizziness,  pre syncopal or syncopal episodes.   -----------------------------------------------------------------------------------------    Patient Active Problem List   Diagnosis Date Noted  . Galactorrhea of both breasts 01/18/2020  . Light headedness 01/18/2020  . Vitamin D insufficiency 01/18/2020  . Alcohol use 01/18/2020  . Hypoglycemia 01/18/2020  . Menorrhagia with regular cycle 12/16/2019  . Fracture of foot 12/16/2019  . BMI 33.0-33.9,adult 12/16/2019  . Blood sugar increased 12/16/2019  . Encounter for screening mammogram for malignant neoplasm of breast 12/16/2019  . History of Roux-en-Y gastric bypass- around age 46 12/16/2019  . Adverse food reaction 12/16/2019  . Depression, major, single episode, mild ([place]) 12/16/2019  . History of ectopic pregnancy 12/16/2019  . Ectopic pregnancy, tubal 12/03/2018  . Iron deficiency anemia 03/02/2018   Past Medical History:  Diagnosis Date  . Anemia   . Anxiety   . Back pain   . Depression   . Hypertrophic cardiomyopathy ([place])    Past Surgical History:  Procedure Laterality Date  . ABDOMINAL SURGERY  2004   gastric  bypass  . CESAREAN SECTION    . DIAGNOSTIC LAPAROSCOPY WITH REMOVAL OF ECTOPIC PREGNANCY Right 12/03/2018   Procedure: DIAGNOSTIC LAPAROSCOPY WITH REMOVAL OF RIGHT ECTOPIC PREGNANCY AND RIGHT OVARY;  Surgeon: Benjaman Kindler, MD;  Location: [facility] ORS;  Service: Gynecology;  Laterality: Right;  . INNER EAR SURGERY    . LAPAROSCOPIC GASTRIC BYPASS  2004  . tubal rupture     Social History   Tobacco Use  . Smoking status: Passive  Smoke Exposure - Never Smoker  . Smokeless tobacco: Never Used  Vaping Use  . Vaping Use: Never used  Substance Use Topics  . Alcohol use: Yes    Comment: socially beer  . Drug use: No   Social History   Socioeconomic History  . Marital status: Single    Spouse name: Not on file  . Number of children: Not on file  . Years of education: Not on file  . Highest education level: Not on file  Occupational History  . Not on file  Tobacco Use  . Smoking status: Passive Smoke Exposure - Never Smoker  . Smokeless tobacco: Never Used  Vaping Use  . Vaping Use: Never used  Substance and Sexual Activity  . Alcohol use: Yes    Comment: socially beer  . Drug use: No  . Sexual activity: Yes  Other Topics Concern  . Not on file  Social History Narrative  . Not on file   Social Determinants of Health   Financial Resource Strain:   . Difficulty of Paying Living Expenses:   Food Insecurity:   . Worried About Charity fundraiser in the Last Year:   . Arboriculturist in the Last Year:   Transportation Needs:   . Film/video editor (Medical):   Marland Kitchen Lack of Transportation (Non-Medical):   Physical Activity:   . Days of Exercise per Week:   . Minutes of Exercise per Session:   Stress:   . Feeling of Stress :   Social Connections:   . Frequency of Communication with Friends and Family:   . Frequency of Social Gatherings with Friends and Family:   . Attends Religious Services:   . Active Member of Clubs or Organizations:   . Attends Archivist Meetings:   Marland Kitchen Marital Status:   Intimate Partner Violence:   . Fear of Current or Ex-Partner:   . Emotionally Abused:   Marland Kitchen Physically Abused:   . Sexually Abused:    Family Status  Relation Name Status  . Mother  Alive  . Father  Alive  . Sister  Alive  . MGF  (Not Specified)  . PGM  (Not Specified)  . PGF  (Not Specified)   Family History  Problem Relation Age of Onset  . Arthritis Mother   . Other Mother        back  pain w stimulator ( internal )  . Kidney disease Father   . Intestinal polyp Father   . Behavior problems Sister   . Diabetes Maternal Grandfather   . Kidney disease Paternal Grandmother   . Cancer Paternal Grandfather    Allergies  Allergen Reactions  . Clindamycin/Lincomycin     Rash       Medications: Outpatient Medications Prior to Visit  Medication Sig  . buPROPion (WELLBUTRIN SR) 150 MG 12 hr tablet Take 1 tablet (150 mg total) by mouth 2 (two) times daily. Start with 150 mg po in  am for 3 days then increased to  BID as above.  . Cyanocobalamin (B-12 COMPLIANCE INJECTION) 1000 MCG/ML KIT Inject 1,000 mcg as directed every 14 (fourteen) days.   . phentermine (ADIPEX-P) 37.5 MG tablet Take 37.5 mg by mouth every morning.  Marland Kitchen UNABLE TO FIND Lipotropic injections given every 2 weeks  . norethindrone-ethinyl estradiol (LOESTRIN) 1-20 MG-MCG tablet Take by mouth. (Patient not taking: Reported on 01/18/2020)   No facility-administered medications prior to visit.    Review of Systems  Constitutional: Negative.        Cold sweats   Eyes: Negative.  Negative for visual disturbance.  Respiratory: Negative.   Cardiovascular: Negative.   Gastrointestinal: Negative.   Endocrine:       Breast discharge for years   Genitourinary: Negative.   Musculoskeletal: Negative.   Skin: Negative.   Neurological: Positive for light-headedness. Negative for dizziness, tremors, seizures, syncope, facial asymmetry, speech difficulty, weakness, numbness and headaches.  Psychiatric/Behavioral: The patient is nervous/anxious.        Panic attack     Last CBC Lab Results  Component Value Date   WBC 7.1 12/16/2019   HGB 12.8 12/16/2019   HCT 39.3 12/16/2019   MCV 95 12/16/2019   MCH 31.0 12/16/2019   RDW 12.7 12/16/2019   PLT 306 12/16/2019   Last metabolic panel Lab Results  Component Value Date   GLUCOSE 90 12/16/2019   NA 142 12/16/2019   K 4.3 12/16/2019   CL 111 (H) 12/16/2019   CO2  21 12/16/2019   BUN 11 12/16/2019   CREATININE 0.61 12/16/2019   GFRNONAA 113 12/16/2019   GFRAA 130 12/16/2019   CALCIUM 9.0 12/16/2019   PROT 6.7 12/16/2019   ALBUMIN 4.1 12/16/2019   LABGLOB 2.6 12/16/2019   AGRATIO 1.6 12/16/2019   BILITOT 0.3 12/16/2019   ALKPHOS 95 12/16/2019   AST 14 12/16/2019   ALT 11 12/16/2019   ANIONGAP 10 10/19/2019   Last lipids Lab Results  Component Value Date   CHOL 151 12/16/2019   HDL 66 12/16/2019   LDLCALC 74 12/16/2019   TRIG 49 12/16/2019   CHOLHDL 2.3 12/16/2019   Last hemoglobin A1c Lab Results  Component Value Date   HGBA1C 4.8 12/16/2019   Last thyroid functions Lab Results  Component Value Date   TSH 1.560 12/16/2019   Last vitamin D No results found for: 25OHVITD2, 25OHVITD3, VD25OH Last vitamin B12 and Folate Lab Results  Component Value Date   VITAMINB12 699 12/16/2019   FOLATE 3.4 12/16/2019      Objective    BP 118/73 (BP Location: Right Arm, Patient Position: Sitting, Cuff Size: Normal)   Pulse 66   Temp (!) 97.1 F (36.2 C) (Temporal)   Wt 195 lb 6.4 oz (88.6 kg)   BMI 33.54 kg/m  BP Readings from Last 3 Encounters:  01/18/20 118/73  12/22/19 134/84  12/16/19 123/80      Physical Exam   General: Appearance:    Obese female in no acute distress  Eyes:    PERRL, conjunctiva/corneas clear, EOM's intact       Lungs:     Clear to auscultation bilaterally, respirations unlabored  Heart:    Normal heart rate. Normal rhythm. No murmurs, rubs, or gallops.   MS:   All extremities are intact.   Neurologic:   Awake, alert, oriented x 3. No apparent focal neurological           defect.         No results found for any visits on  01/18/20.  Assessment & Plan     The primary encounter diagnosis was Galactorrhea of both breasts. Diagnoses of Light headedness, Vitamin D insufficiency, Alcohol use, and Hypoglycemia were also pertinent to this visit.  Orders Placed This Encounter  Procedures  . PTH, Intact  and Calcium  . Prolactin  . Magnesium  . VITAMIN D 25 Hydroxy (Vit-D Deficiency, Fractures)   recommend she do proceed with allergy testing as discussed. Will check labs above for Galactorrhea from both breasts and endocrine referral reccommended.    Orders Placed This Encounter  Procedures  . PTH, Intact and Calcium  . Prolactin  . Magnesium  . VITAMIN D 25 Hydroxy (Vit-D Deficiency, Fractures)    Return in about 3 weeks (around 02/08/2020), or if symptoms worsen or fail to improve, for at any time for any worsening symptoms, Go to Emergency room/ urgent care if worse.      [place] Scottie Stanish, FNP, have reviewed all documentation for this visit. The documentation on 01/18/20 for the exam, diagnosis, procedures, and orders are all accurate and complete.   [name], [place] [phone] (phone) [phone] (fax)  [place]

## 2020-01-19 ENCOUNTER — Telehealth: Payer: Self-pay

## 2020-01-19 DIAGNOSIS — N643 Galactorrhea not associated with childbirth: Secondary | ICD-10-CM

## 2020-01-19 LAB — PTH, INTACT AND CALCIUM
Calcium: 9.1 mg/dL (ref 8.7–10.2)
PTH: 27 pg/mL (ref 15–65)

## 2020-01-19 LAB — PROLACTIN: Prolactin: 13 ng/mL (ref 4.8–23.3)

## 2020-01-19 LAB — MAGNESIUM: Magnesium: 1.9 mg/dL (ref 1.6–2.3)

## 2020-01-19 LAB — VITAMIN D 25 HYDROXY (VIT D DEFICIENCY, FRACTURES): Vit D, 25-Hydroxy: 31 ng/mL (ref 30.0–100.0)

## 2020-01-19 NOTE — Telephone Encounter (Signed)
Patient has been advised. KW 

## 2020-01-19 NOTE — Telephone Encounter (Signed)
-----   Message from Berniece Pap, FNP sent at 01/19/2020  8:09 AM EDT ----- Labs within normal range. Keep allergy testing appointment and will  refer to endocrinology for nipple discharge since mammogram/ [place] within normal limits and this has been chronic.  Dx. Galactorrhea- chronic ok to place referral if patient agrees with recommended plan.

## 2020-01-19 NOTE — Progress Notes (Signed)
Labs within normal range. Keep allergy testing appointment and will  refer to endocrinology for nipple discharge since mammogram/ [place] within normal limits and this has been chronic.  Dx. Galactorrhea- chronic ok to place referral if patient agrees with recommended plan.

## 2020-02-14 ENCOUNTER — Other Ambulatory Visit: Payer: Self-pay | Admitting: Oncology

## 2020-02-17 ENCOUNTER — Inpatient Hospital Stay: Payer: Medicaid Other

## 2020-02-17 NOTE — Progress Notes (Deleted)
[name]  [phone] [phone]  ID: Rebecca Lang OB: September 12, 1975  MR#: [id number]  KCL#:[id number]  Patient Care Team: Doreen Beam, FNP as PCP - General (Family Medicine) Lloyd Huger, MD as Consulting Physician (Hematology and Oncology)  CHIEF COMPLAINT: Iron deficiency and B12 deficiency anemia.  INTERVAL HISTORY: Patient returns to clinic today for repeat laboratory, further evaluation, and can continuation of IV Feraheme.  She currently feels well and is asymptomatic.  She does not complain of weakness or fatigue today.  She has no neurologic complaints.  She denies any recent fevers or illnesses.  She has a good appetite and denies weight loss.  She denies any chest pain, shortness of breath, cough, or hemoptysis.  She denies any nausea, vomiting, constipation, or diarrhea.  She denies any melena or hematochezia.  She has no urinary complaints.  Patient offers no specific complaints today.  REVIEW OF SYSTEMS:   Review of Systems  Constitutional: Negative.  Negative for fever, malaise/fatigue and weight loss.  Respiratory: Negative.  Negative for cough and shortness of breath.   Cardiovascular: Negative.  Negative for chest pain and leg swelling.  Gastrointestinal: Negative.  Negative for abdominal pain, blood in stool and melena.  Genitourinary: Negative.  Negative for dysuria and hematuria.  Musculoskeletal: Negative.  Negative for back pain.  Skin: Negative.  Negative for rash.  Neurological: Negative.  Negative for focal weakness, weakness and headaches.  Psychiatric/Behavioral: Negative.  The patient is not nervous/anxious.     As per HPI. Otherwise, a complete review of systems is negative.  PAST MEDICAL HISTORY: Past Medical History:  Diagnosis Date  . Anemia   . Anxiety   . Back pain   . Depression   . Hypertrophic cardiomyopathy ([place])     PAST SURGICAL HISTORY: Past Surgical History:  Procedure  Laterality Date  . ABDOMINAL SURGERY  2004   gastric bypass  . CESAREAN SECTION    . DIAGNOSTIC LAPAROSCOPY WITH REMOVAL OF ECTOPIC PREGNANCY Right 12/03/2018   Procedure: DIAGNOSTIC LAPAROSCOPY WITH REMOVAL OF RIGHT ECTOPIC PREGNANCY AND RIGHT OVARY;  Surgeon: Benjaman Kindler, MD;  Location: [facility] ORS;  Service: Gynecology;  Laterality: Right;  . INNER EAR SURGERY    . LAPAROSCOPIC GASTRIC BYPASS  2004  . tubal rupture      FAMILY HISTORY: Family History  Problem Relation Age of Onset  . Arthritis Mother   . Other Mother        back pain w stimulator ( internal )  . Kidney disease Father   . Intestinal polyp Father   . Behavior problems Sister   . Diabetes Maternal Grandfather   . Kidney disease Paternal Grandmother   . Cancer Paternal Grandfather     ADVANCED DIRECTIVES (Y/N):  N  HEALTH MAINTENANCE: Social History   Tobacco Use  . Smoking status: Passive Smoke Exposure - Never Smoker  . Smokeless tobacco: Never Used  Vaping Use  . Vaping Use: Never used  Substance Use Topics  . Alcohol use: Yes    Comment: socially beer  . Drug use: No     Colonoscopy:  PAP:  Bone density:  Lipid panel:  Allergies  Allergen Reactions  . Clindamycin/Lincomycin     Rash    Current Outpatient Medications  Medication Sig Dispense Refill  . blood glucose meter kit and supplies Check blood glucose fasting in am Dispense based on patient and insurance preference. Use up to four times daily as directed. (FOR ICD-10 E10.9, E11.9). 1  each 0  . buPROPion (WELLBUTRIN SR) 150 MG 12 hr tablet Take 1 tablet (150 mg total) by mouth 2 (two) times daily. Start with 150 mg po in  am for 3 days then increased to BID as above. 180 tablet 0  . Cyanocobalamin (B-12 COMPLIANCE INJECTION) 1000 MCG/ML KIT Inject 1,000 mcg as directed every 14 (fourteen) days.     . norethindrone-ethinyl estradiol (LOESTRIN) 1-20 MG-MCG tablet Take by mouth. (Patient not taking: Reported on 01/18/2020)    . phentermine  (ADIPEX-P) 37.5 MG tablet Take 37.5 mg by mouth every morning.    Marland Kitchen UNABLE TO FIND Lipotropic injections given every 2 weeks     No current facility-administered medications for this visit.    OBJECTIVE: There were no vitals filed for this visit.   There is no height or weight on file to calculate BMI.    ECOG FS:0 - Asymptomatic  General: Well-developed, well-nourished, no acute distress. Eyes: Pink conjunctiva, anicteric sclera. HEENT: Normocephalic, moist mucous membranes. Lungs: No audible wheezing or coughing. Heart: Regular rate and rhythm. Abdomen: Soft, nontender, no obvious distention. Musculoskeletal: No edema, cyanosis, or clubbing. Neuro: Alert, answering all questions appropriately. Cranial nerves grossly intact. Skin: No rashes or petechiae noted. Psych: Normal affect.   LAB RESULTS:  Lab Results  Component Value Date   NA 142 12/16/2019   K 4.3 12/16/2019   CL 111 (H) 12/16/2019   CO2 21 12/16/2019   GLUCOSE 90 12/16/2019   BUN 11 12/16/2019   CREATININE 0.61 12/16/2019   CALCIUM 9.1 01/18/2020   PROT 6.7 12/16/2019   ALBUMIN 4.1 12/16/2019   AST 14 12/16/2019   ALT 11 12/16/2019   ALKPHOS 95 12/16/2019   BILITOT 0.3 12/16/2019   GFRNONAA 113 12/16/2019   GFRAA 130 12/16/2019    Lab Results  Component Value Date   WBC 7.1 12/16/2019   NEUTROABS 4.7 12/16/2019   HGB 12.8 12/16/2019   HCT 39.3 12/16/2019   MCV 95 12/16/2019   PLT 306 12/16/2019   Lab Results  Component Value Date   IRON 68 12/16/2019   TIBC 292 12/16/2019   IRONPCTSAT 23 12/16/2019   Lab Results  Component Value Date   FERRITIN 64 12/16/2019     STUDIES: No results found.  ASSESSMENT: Iron deficiency and B12 deficiency anemia  PLAN:   1. Iron deficiency anemia: Likely secondary to poor absorption from patient's history of gastric bypass surgery.  Previously, the remainder of her laboratory work was either negative or within normal limits.  Patient's hemoglobin and  iron stores are now within normal limits, but will proceed with an additional 510 mg IV Feraheme for preventative measures.  She does not require second infusion.  Return to clinic in 4 months with repeat laboratory work, further evaluation, and continuation of treatment if necessary.  2.  B12 deficiency: Patient reports she gets B12 injections with her primary care physician.    I spent a total of 30 minutes reviewing chart data, face-to-face evaluation with the patient, counseling and coordination of care as detailed above.  Patient expressed understanding and was in agreement with this plan. She also understands that She can call clinic at any time with any questions, concerns, or complaints.    Lloyd Huger, MD   02/17/2020 6:25 AM

## 2020-02-20 ENCOUNTER — Inpatient Hospital Stay: Payer: Medicaid Other | Admitting: Oncology

## 2020-02-20 ENCOUNTER — Inpatient Hospital Stay: Payer: Medicaid Other

## 2020-02-21 NOTE — Progress Notes (Deleted)
     Established patient visit   Patient: Rebecca Lang   DOB: 04/28/1976   44 y.o. Female  MRN: [id number] Visit Date: 02/22/2020  Today's healthcare provider: Lavon Paganini, MD   No chief complaint on file.  Subjective     Subjective:   Rebecca Lang is an 44 y.o. female who presents for evaluation of a breast mass. Change was noted {1-10:13787} {time; units:19136} ago, and has been {progression:18636} since first identified. Patient {does/does not:19097} routinely do self breast exams. The mass {does/does not:19097} change during menstrual cycle. The mass {is/is not:9024} tender. Patient {denies/admits to:19208} nipple discharge. Breast cancer risk factors include: {breast ca risks:12807}.   ***  {Show patient history (optional):23778::" "}   Medications: Outpatient Medications Prior to Visit  Medication Sig  . blood glucose meter kit and supplies Check blood glucose fasting in am Dispense based on patient and insurance preference. Use up to four times daily as directed. (FOR ICD-10 E10.9, E11.9).  Marland Kitchen buPROPion (WELLBUTRIN SR) 150 MG 12 hr tablet Take 1 tablet (150 mg total) by mouth 2 (two) times daily. Start with 150 mg po in  am for 3 days then increased to BID as above.  . Cyanocobalamin (B-12 COMPLIANCE INJECTION) 1000 MCG/ML KIT Inject 1,000 mcg as directed every 14 (fourteen) days.   . norethindrone-ethinyl estradiol (LOESTRIN) 1-20 MG-MCG tablet Take by mouth. (Patient not taking: Reported on 01/18/2020)  . phentermine (ADIPEX-P) 37.5 MG tablet Take 37.5 mg by mouth every morning.  Marland Kitchen UNABLE TO FIND Lipotropic injections given every 2 weeks   No facility-administered medications prior to visit.    Review of Systems  {Heme  Chem  Endocrine  Serology  Results Review (optional):23779::" "}  Objective    There were no vitals taken for this visit. {Show previous vital signs (optional):23777::" "}  Physical Exam  ***  No results found for any visits on  02/22/20.  Assessment & Plan     ***  No follow-ups on file.      {provider attestation***:1}   Lavon Paganini, MD  [facility] [phone] (phone) [phone] (fax)  [place]

## 2020-02-22 ENCOUNTER — Ambulatory Visit (INDEPENDENT_AMBULATORY_CARE_PROVIDER_SITE_OTHER): Payer: Medicaid Other | Admitting: Family Medicine

## 2020-02-22 ENCOUNTER — Other Ambulatory Visit: Payer: Self-pay

## 2020-02-22 ENCOUNTER — Encounter: Payer: Self-pay | Admitting: Family Medicine

## 2020-02-22 ENCOUNTER — Inpatient Hospital Stay: Payer: Medicaid Other | Attending: Oncology

## 2020-02-22 VITALS — BP 121/76 | HR 64 | Temp 98.4°F | Resp 16 | Ht 65.0 in | Wt 185.0 lb

## 2020-02-22 DIAGNOSIS — D509 Iron deficiency anemia, unspecified: Secondary | ICD-10-CM | POA: Diagnosis not present

## 2020-02-22 DIAGNOSIS — L237 Allergic contact dermatitis due to plants, except food: Secondary | ICD-10-CM | POA: Diagnosis not present

## 2020-02-22 DIAGNOSIS — N6324 Unspecified lump in the left breast, lower inner quadrant: Secondary | ICD-10-CM | POA: Diagnosis not present

## 2020-02-22 LAB — CBC WITH DIFFERENTIAL/PLATELET
Abs Immature Granulocytes: 0.03 10*3/uL (ref 0.00–0.07)
Basophils Absolute: 0 10*3/uL (ref 0.0–0.1)
Basophils Relative: 0 %
Eosinophils Absolute: 0.1 10*3/uL (ref 0.0–0.5)
Eosinophils Relative: 1 %
HCT: 39.5 % (ref 36.0–46.0)
Hemoglobin: 13.7 g/dL (ref 12.0–15.0)
Immature Granulocytes: 0 %
Lymphocytes Relative: 13 %
Lymphs Abs: 1 10*3/uL (ref 0.7–4.0)
MCH: 31.5 pg (ref 26.0–34.0)
MCHC: 34.7 g/dL (ref 30.0–36.0)
MCV: 90.8 fL (ref 80.0–100.0)
Monocytes Absolute: 0.4 10*3/uL (ref 0.1–1.0)
Monocytes Relative: 6 %
Neutro Abs: 6 10*3/uL (ref 1.7–7.7)
Neutrophils Relative %: 80 %
Platelets: 276 10*3/uL (ref 150–400)
RBC: 4.35 MIL/uL (ref 3.87–5.11)
RDW: 13.1 % (ref 11.5–15.5)
WBC: 7.5 10*3/uL (ref 4.0–10.5)
nRBC: 0 % (ref 0.0–0.2)

## 2020-02-22 LAB — VITAMIN B12: Vitamin B-12: 386 pg/mL (ref 180–914)

## 2020-02-22 LAB — IRON AND TIBC
Iron: 135 ug/dL (ref 28–170)
Saturation Ratios: 40 % — ABNORMAL HIGH (ref 10.4–31.8)
TIBC: 340 ug/dL (ref 250–450)
UIBC: 205 ug/dL

## 2020-02-22 LAB — FERRITIN: Ferritin: 31 ng/mL (ref 11–307)

## 2020-02-22 MED ORDER — TRIAMCINOLONE ACETONIDE 0.5 % EX OINT: 1.0000 "application " | TOPICAL_OINTMENT | Freq: Two times a day (BID) | CUTANEOUS | 2 refills | Status: DC

## 2020-02-22 NOTE — Progress Notes (Signed)
Established patient visit   Patient: Rebecca Lang   DOB: 08-22-1975   44 y.o. Female  MRN: [id number] Visit Date: 02/22/2020  Today's healthcare provider: Lavon Paganini, MD   Chief Complaint  Patient presents with  . Breast Mass   Subjective     Subjective:   Rebecca Lang is an 44 y.o. female who presents for evaluation of a breast mass. Change was noted 1 week ago, and has slightly increased in size since first identified. Patient does routinely do self breast exams. The mass does not change during menstrual cycle. The mass is tender. Patient reports chronic, intermittent nipple discharge that is dark in color but nonbloody. Breast cancer risk factors include: none.  No overlying skin changes, axillary lymphadenopathy, redness, fever.  Last mammogram in June was normal.    She also reports pruritic rash of right upper thigh and abdomen for about 2 weeks.  It started after being at a cookout.  She is worried it may be poison ivy.  She is using an over-the-counter cream for this.  Patient Active Problem List   Diagnosis Date Noted  . Galactorrhea of both breasts 01/18/2020  . Light headedness 01/18/2020  . Vitamin D insufficiency 01/18/2020  . Alcohol use 01/18/2020  . Hypoglycemia 01/18/2020  . Menorrhagia with regular cycle 12/16/2019  . Fracture of foot 12/16/2019  . BMI 33.0-33.9,adult 12/16/2019  . Blood sugar increased 12/16/2019  . Encounter for screening mammogram for malignant neoplasm of breast 12/16/2019  . History of Roux-en-Y gastric bypass- around age 27 12/16/2019  . Adverse food reaction 12/16/2019  . Depression, major, single episode, mild ([place]) 12/16/2019  . History of ectopic pregnancy 12/16/2019  . Ectopic pregnancy, tubal 12/03/2018  . Iron deficiency anemia 03/02/2018   Social History   Tobacco Use  . Smoking status: Passive Smoke Exposure - Never Smoker  . Smokeless tobacco: Never Used  Vaping Use  . Vaping Use: Never used    Substance Use Topics  . Alcohol use: Yes    Comment: socially beer  . Drug use: No   Allergies  Allergen Reactions  . Clindamycin/Lincomycin     Rash       Medications: Outpatient Medications Prior to Visit  Medication Sig  . blood glucose meter kit and supplies Check blood glucose fasting in am Dispense based on patient and insurance preference. Use up to four times daily as directed. (FOR ICD-10 E10.9, E11.9).  Marland Kitchen buPROPion (WELLBUTRIN SR) 150 MG 12 hr tablet Take 1 tablet (150 mg total) by mouth 2 (two) times daily. Start with 150 mg po in  am for 3 days then increased to BID as above.  . Cyanocobalamin (B-12 COMPLIANCE INJECTION) 1000 MCG/ML KIT Inject 1,000 mcg as directed every 14 (fourteen) days.   . phentermine (ADIPEX-P) 37.5 MG tablet Take 37.5 mg by mouth every morning.  Marland Kitchen UNABLE TO FIND Lipotropic injections given every 2 weeks  . [DISCONTINUED] norethindrone-ethinyl estradiol (LOESTRIN) 1-20 MG-MCG tablet Take by mouth. (Patient not taking: Reported on 01/18/2020)   No facility-administered medications prior to visit.    Review of Systems  Constitutional: Negative.   Respiratory: Negative.   Cardiovascular: Negative.   Gastrointestinal: Negative.   Skin: Negative.   Neurological: Negative.       Objective    BP 121/76 (BP Location: Left Arm, Patient Position: Sitting, Cuff Size: Normal)   Pulse 64   Temp 98.4 F (36.9 C) (Oral)   Resp 16   Ht 5'  5" (1.651 m)   Wt 185 lb (83.9 kg)   LMP 02/06/2020 (Exact Date)   Breastfeeding No   BMI 30.79 kg/m  BP Readings from Last 3 Encounters:  02/22/20 121/76  01/18/20 118/73  12/22/19 134/84   Wt Readings from Last 3 Encounters:  02/22/20 185 lb (83.9 kg)  01/18/20 195 lb 6.4 oz (88.6 kg)  12/22/19 193 lb 6.4 oz (87.7 kg)      Physical Exam Vitals and nursing note reviewed.  Constitutional:      General: She is not in acute distress.    Appearance: Normal appearance. She is not diaphoretic.  HENT:      Head: Normocephalic and atraumatic.  Eyes:     Conjunctiva/sclera: Conjunctivae normal.  Cardiovascular:     Rate and Rhythm: Normal rate and regular rhythm.  Pulmonary:     Effort: Pulmonary effort is normal. No respiratory distress.  Chest:     Breasts:        Right: Normal. No bleeding, inverted nipple, mass, nipple discharge, skin change or tenderness.        Left: Mass (small lump palpable at 8 oclock position) and tenderness present. No bleeding, inverted nipple, nipple discharge or skin change.  Musculoskeletal:     Right lower leg: No edema.     Left lower leg: No edema.  Lymphadenopathy:     Upper Body:     Right upper body: No axillary adenopathy.     Left upper body: No axillary adenopathy.  Skin:    General: Skin is warm and dry.     Findings: Rash (erythematous rash of R upper thigh and lower abdomen) present.  Neurological:     Mental Status: She is alert. Mental status is at baseline.  Psychiatric:        Mood and Affect: Mood normal.        Behavior: Behavior normal.      No results found for any visits on 02/22/20.  Assessment & Plan     1. Breast lump on left side at 8 o'clock position -New problem for 1 to 2 weeks -She did have a recent mammogram for her nipple discharge, but this is a new problem since then -Lump is small and mobile and slightly tender -Obtain diagnostic mammogram and ultrasound for further evaluation -Discussed Tylenol/NSAIDs, warm compress as needed -Return precautions discussed - MM DIAG BREAST TOMO UNI LEFT; Future - US BREAST LTD UNI LEFT INC AXILLA; Future  2. Contact dermatitis due to poison ivy -New problem -Rash is consistent with poison ivy with no secondary infection -Triamcinolone twice daily until resolution -Return precautions discussed - triamcinolone ointment (KENALOG) 0.5 %; Apply 1 application topically 2 (two) times daily.  Dispense: 30 g; Refill: 2      Return if symptoms worsen or fail to improve.       I, Lavon Paganini, MD, have reviewed all documentation for this visit. The documentation on 02/22/20 for the exam, diagnosis, procedures, and orders are all accurate and complete.   Terril Amaro, Dionne Bucy, MD, MPH [facility]

## 2020-02-22 NOTE — Patient Instructions (Signed)

## 2020-02-23 ENCOUNTER — Encounter: Payer: Self-pay | Admitting: Oncology

## 2020-02-24 ENCOUNTER — Inpatient Hospital Stay: Payer: Medicaid Other | Admitting: Oncology

## 2020-02-24 ENCOUNTER — Inpatient Hospital Stay: Payer: Medicaid Other

## 2020-02-25 NOTE — Progress Notes (Signed)
This encounter was created in error - please disregard.

## 2020-02-28 ENCOUNTER — Ambulatory Visit
Admission: RE | Admit: 2020-02-28 | Discharge: 2020-02-28 | Disposition: A | Discharge status: Home / Self Care | Payer: Medicaid Other | Source: Ambulatory Visit | Attending: Family Medicine | Admitting: Family Medicine

## 2020-02-28 ENCOUNTER — Other Ambulatory Visit: Payer: Self-pay

## 2020-02-28 DIAGNOSIS — N6324 Unspecified lump in the left breast, lower inner quadrant: Secondary | ICD-10-CM

## 2020-03-16 ENCOUNTER — Telehealth: Payer: Self-pay

## 2020-03-16 ENCOUNTER — Ambulatory Visit: Payer: Self-pay | Admitting: Physician Assistant

## 2020-03-16 DIAGNOSIS — F320 Major depressive disorder, single episode, mild: Secondary | ICD-10-CM

## 2020-03-16 MED ORDER — BUPROPION HCL ER (SR) 150 MG PO TB12: 150.0000 mg | ORAL_TABLET | Freq: Two times a day (BID) | ORAL | 0 refills | Status: DC

## 2020-03-16 NOTE — Telephone Encounter (Signed)
Refilled x 1 month 

## 2020-03-16 NOTE — Telephone Encounter (Signed)
Copied from CRM [id number]. Topic: General - Other >> Mar 16, 2020  9:48 AM Herby Abraham C wrote: Reason for CRM: pt called in to reschedule her visit for today. Pt got a flat tire and was unable to make her apt. Pt would like to know if provider would fill her Rx buPROPion (WELLBUTRIN SR) 150 MG 12 hr tablet  until her apt?

## 2020-03-20 ENCOUNTER — Ambulatory Visit (INDEPENDENT_AMBULATORY_CARE_PROVIDER_SITE_OTHER): Payer: Medicaid Other | Admitting: Physician Assistant

## 2020-03-20 ENCOUNTER — Other Ambulatory Visit: Payer: Self-pay

## 2020-03-20 ENCOUNTER — Ambulatory Visit: Payer: Self-pay | Admitting: Adult Health

## 2020-03-20 ENCOUNTER — Encounter: Payer: Self-pay | Admitting: Physician Assistant

## 2020-03-20 VITALS — BP 131/79 | HR 96 | Temp 98.6°F | Wt 185.0 lb

## 2020-03-20 DIAGNOSIS — F320 Major depressive disorder, single episode, mild: Secondary | ICD-10-CM

## 2020-03-20 NOTE — Progress Notes (Signed)
Established patient visit   Patient: Rebecca Lang   DOB: 02-Dec-1975   44 y.o. Female  MRN: [id number] Visit Date: 03/20/2020  Today's healthcare provider: Trinna Post, PA-C   Chief Complaint  Patient presents with  . Depression  I,Porsha C McClurkin,acting as a scribe for Trinna Post, PA-C.,have documented all relevant documentation on the behalf of Trinna Post, PA-C,as directed by  Trinna Post, PA-C while in the presence of Trinna Post, PA-C.  Subjective    HPI  Depression, Follow-up  She  was last seen for this 3 months ago. Changes made at last visit include patient takes Wellbutrin.   She reports good compliance with treatment. She is not having side effects.   She reports good tolerance of treatment. Current symptoms include: none She feels she is Improved since last visit.  Depression screen [facility] 2/9 03/20/2020 12/16/2019  Decreased Interest 0 1  Down, Depressed, Hopeless 0 1  PHQ - 2 Score 0 2  Altered sleeping 3 2  Tired, decreased energy 3 1  Change in appetite 0 0  Feeling bad or failure about yourself  0 0  Trouble concentrating 3 3  Moving slowly or fidgety/restless 0 0  Suicidal thoughts 0 0  PHQ-9 Score 9 8  Difficult doing work/chores Not difficult at all Very difficult    -----------------------------------------------------------------------------------------      Medications: Outpatient Medications Prior to Visit  Medication Sig  . blood glucose meter kit and supplies Check blood glucose fasting in am Dispense based on patient and insurance preference. Use up to four times daily as directed. (FOR ICD-10 E10.9, E11.9).  Marland Kitchen buPROPion (WELLBUTRIN SR) 150 MG 12 hr tablet Take 1 tablet (150 mg total) by mouth 2 (two) times daily. Start with 150 mg po in  am for 3 days then increased to BID as above.  . Cyanocobalamin (B-12 COMPLIANCE INJECTION) 1000 MCG/ML KIT Inject 1,000 mcg as directed every 14 (fourteen) days.   .  phentermine (ADIPEX-P) 37.5 MG tablet Take 37.5 mg by mouth every morning.  . triamcinolone ointment (KENALOG) 0.5 % Apply 1 application topically 2 (two) times daily.  Marland Kitchen UNABLE TO FIND Lipotropic injections given every 2 weeks   No facility-administered medications prior to visit.    Review of Systems  Constitutional: Negative.   Respiratory: Negative.   Cardiovascular: Negative.   Hematological: Negative.   Psychiatric/Behavioral: Positive for decreased concentration and sleep disturbance. Negative for agitation, self-injury and suicidal ideas. The patient is not nervous/anxious.       Objective    BP 131/79 (BP Location: Left Arm, Patient Position: Sitting, Cuff Size: Normal)   Pulse 96   Temp 98.6 F (37 C) (Oral)   Wt 185 lb (83.9 kg)   BMI 30.79 kg/m    Physical Exam Constitutional:      Appearance: Normal appearance. She is obese.  Cardiovascular:     Rate and Rhythm: Normal rate.  Pulmonary:     Effort: Pulmonary effort is normal.  Skin:    General: Skin is warm and dry.  Neurological:     General: No focal deficit present.     Mental Status: She is alert and oriented to person, place, and time.  Psychiatric:        Mood and Affect: Mood normal.        Behavior: Behavior normal.       No results found for any visits on 03/20/20.  Assessment & Plan  1. Depression, major, single episode, mild ([facility])  Continue wellbutrin.    No follow-ups on file.      ITrinna Post, PA-C, have reviewed all documentation for this visit. The documentation on 03/27/20 for the exam, diagnosis, procedures, and orders are all accurate and complete.  The entirety of the information documented in the History of Present Illness, Review of Systems and Physical Exam were personally obtained by me. Portions of this information were initially documented by Wilburt Finlay, CMA and reviewed by me for thoroughness and accuracy.     Paulene Floor  [facility] [phone] (phone) [phone] (fax)  [place]

## 2020-03-20 NOTE — Patient Instructions (Signed)
Depression Screening Depression screening is a tool that your health care provider can use to learn if you have symptoms of depression. Depression is a common condition with many symptoms that are also often found in other conditions. Depression is treatable, but it must first be diagnosed. You may not know that certain feelings, thoughts, and behaviors that you are having can be symptoms of depression. Taking a depression screening test can help you and your health care provider decide if you need more assessment, or if you should be referred to a mental health care provider. What are the screening tests?  You may have a physical exam to see if another condition is affecting your mental health. You may have a blood or urine sample taken during the physical exam.  You may be interviewed using a screening tool that was developed from research, such as one of these: ? Patient Health Questionnaire (PHQ). This is a set of either 2 or 9 questions. A health care provider who has been trained to score this screening test uses a guide to assess if your symptoms suggest that you may have depression. ? Hamilton Depression Rating Scale (HAM-D). This is a set of either 17 or 24 questions. You may be asked to take it again during or after your treatment, to see if your depression has gotten better. ? Beck Depression Inventory (BDI). This is a set of 21 multiple choice questions. Your health care provider scores your answers to assess:  Your level of depression, ranging from mild to severe.  Your response to treatment.  Your health care provider may talk with you about your daily activities, such as eating, sleeping, work, and recreation, and ask if you have had any changes in activity.  Your health care provider may ask you to see a mental health specialist, such as a psychiatrist or psychologist, for more evaluation. Who should be screened for depression?   All adults, including adults with a family history  of a mental health disorder.  Adolescents who are 12-18 years old.  People who are recovering from a myocardial infarction (MI).  Pregnant women, or women who have given birth.  People who have a long-term (chronic) illness.  Anyone who has been diagnosed with another type of a mental health disorder.  Anyone who has symptoms that could show depression. What do my results mean? Your health care provider will review the results of your depression screening, physical exam, and lab tests. Positive screens suggest that you may have depression. Screening is the first step in getting the care that you may need. It is up to you to get your screening results. Ask your health care provider, or the department that is doing your screening tests, when your results will be ready. Talk with your health care provider about your results and diagnosis. A diagnosis of depression is made using the Diagnostic and Statistical Manual of Mental Disorders (DSM-V). This is a book that lists the number and type of symptoms that must be present for a health care provider to give a specific diagnosis.  Your health care provider may work with you to treat your symptoms of depression, or your health care provider may help you find a mental health provider who can assess, diagnose, and treat your depression. Get help right away if:  You have thoughts about hurting yourself or others. If you ever feel like you may hurt yourself or others, or have thoughts about taking your own life, get help right away. You   can go to your nearest emergency department or call:  Your local emergency services (911 in the [place]  A suicide crisis helpline, such as the National Suicide Prevention Lifeline at 1-800-273-8255. This is open 24 hours a day. Summary  Depression screening is the first step in getting the help that you may need.  If your screening test shows symptoms of depression (is positive), your health care provider may ask  you to see a mental health provider.  Anyone who is age 12 or older should be screened for depression. This information is not intended to replace advice given to you by your health care provider. Make sure you discuss any questions you have with your health care provider. Document Revised: 06/12/2017 Document Reviewed: 11/14/2016 Elsevier Patient Education  2020 Elsevier Inc.  

## 2020-03-22 ENCOUNTER — Ambulatory Visit: Payer: Self-pay | Admitting: Adult Health

## 2020-08-21 ENCOUNTER — Telehealth: Payer: Self-pay

## 2020-08-21 NOTE — Telephone Encounter (Signed)
Spoke with patient on the phone who states that pain began in her left forearm/wrist area about two weeks ago. Patient states that she recently started back at school and has been typing a lot on class. Patient reports concerns of possible carpal tunnel because she woke up this morning in sever pain unable to bend at wrist area. I advised patient that her last visit was in September she would need office visit for evaluation and treatment. Patient advised me of [facility] walk in clinic hours which I confirmed with her, patient states since she was in so much pain today she was going to [facility] this evening for evaluation and treatment and will call our office back to schedule a follow up. KW

## 2020-08-21 NOTE — Telephone Encounter (Signed)
Patient is calling again to ask the nurse or doctor to call her regarding her arm.  She stated she still has not heard from anyone.  Please call patient to discuss at [phone]

## 2020-08-21 NOTE — Telephone Encounter (Signed)
Copied from CRM [id number]. Topic: General - Other >> Aug 21, 2020 10:02 AM [name] wrote: Reason for CRM: Patient called in to say that she have an issue with her left arm not sure what happened but she need to be seen but want to know if she need to be seen here or can she just get a referral to the orthopedic. Please call Ph#  [phone]

## 2020-08-21 NOTE — Telephone Encounter (Signed)
Notes

## 2020-08-30 ENCOUNTER — Inpatient Hospital Stay: Payer: Medicaid Other | Attending: Oncology

## 2020-08-30 DIAGNOSIS — E538 Deficiency of other specified B group vitamins: Secondary | ICD-10-CM | POA: Diagnosis not present

## 2020-08-30 DIAGNOSIS — F419 Anxiety disorder, unspecified: Secondary | ICD-10-CM | POA: Insufficient documentation

## 2020-08-30 DIAGNOSIS — F329 Major depressive disorder, single episode, unspecified: Secondary | ICD-10-CM | POA: Diagnosis not present

## 2020-08-30 DIAGNOSIS — D509 Iron deficiency anemia, unspecified: Secondary | ICD-10-CM | POA: Diagnosis not present

## 2020-08-30 DIAGNOSIS — Z79899 Other long term (current) drug therapy: Secondary | ICD-10-CM | POA: Diagnosis not present

## 2020-08-30 DIAGNOSIS — A630 Anogenital (venereal) warts: Secondary | ICD-10-CM | POA: Insufficient documentation

## 2020-08-30 DIAGNOSIS — R531 Weakness: Secondary | ICD-10-CM | POA: Diagnosis not present

## 2020-08-30 LAB — IRON AND TIBC
Iron: 81 ug/dL (ref 28–170)
Saturation Ratios: 20 % (ref 10.4–31.8)
TIBC: 398 ug/dL (ref 250–450)
UIBC: 317 ug/dL

## 2020-08-30 LAB — CBC WITH DIFFERENTIAL/PLATELET
Abs Immature Granulocytes: 0.11 10*3/uL — ABNORMAL HIGH (ref 0.00–0.07)
Basophils Absolute: 0 10*3/uL (ref 0.0–0.1)
Basophils Relative: 0 %
Eosinophils Absolute: 0.1 10*3/uL (ref 0.0–0.5)
Eosinophils Relative: 1 %
HCT: 37.9 % (ref 36.0–46.0)
Hemoglobin: 12.6 g/dL (ref 12.0–15.0)
Immature Granulocytes: 1 %
Lymphocytes Relative: 22 %
Lymphs Abs: 2.3 10*3/uL (ref 0.7–4.0)
MCH: 30.4 pg (ref 26.0–34.0)
MCHC: 33.2 g/dL (ref 30.0–36.0)
MCV: 91.5 fL (ref 80.0–100.0)
Monocytes Absolute: 0.6 10*3/uL (ref 0.1–1.0)
Monocytes Relative: 5 %
Neutro Abs: 7.3 10*3/uL (ref 1.7–7.7)
Neutrophils Relative %: 71 %
Platelets: 329 10*3/uL (ref 150–400)
RBC: 4.14 MIL/uL (ref 3.87–5.11)
RDW: 13.1 % (ref 11.5–15.5)
WBC: 10.4 10*3/uL (ref 4.0–10.5)
nRBC: 0 % (ref 0.0–0.2)

## 2020-08-30 LAB — FERRITIN: Ferritin: 9 ng/mL — ABNORMAL LOW (ref 11–307)

## 2020-08-31 ENCOUNTER — Other Ambulatory Visit: Payer: Self-pay

## 2020-08-31 ENCOUNTER — Inpatient Hospital Stay: Payer: Medicaid Other

## 2020-08-31 ENCOUNTER — Inpatient Hospital Stay (HOSPITAL_BASED_OUTPATIENT_CLINIC_OR_DEPARTMENT_OTHER): Payer: Medicaid Other | Admitting: Oncology

## 2020-08-31 VITALS — BP 130/69 | HR 62 | Temp 98.1°F | Resp 16 | Wt 192.9 lb

## 2020-08-31 VITALS — BP 123/75 | HR 52 | Temp 97.5°F | Resp 17

## 2020-08-31 DIAGNOSIS — D509 Iron deficiency anemia, unspecified: Secondary | ICD-10-CM | POA: Diagnosis not present

## 2020-08-31 DIAGNOSIS — E538 Deficiency of other specified B group vitamins: Secondary | ICD-10-CM

## 2020-08-31 MED ORDER — CYANOCOBALAMIN 1000 MCG/ML IJ SOLN: 1000.0000 ug | INTRAMUSCULAR | 6 refills | Status: DC

## 2020-08-31 MED ORDER — SODIUM CHLORIDE 0.9 % IV SOLN: 200.0000 mg | Freq: Once | INTRAVENOUS | Status: DC

## 2020-08-31 MED ORDER — IRON SUCROSE 20 MG/ML IV SOLN
200.0000 mg | Freq: Once | INTRAVENOUS | Status: AC
  Administered 2020-08-31: 200 mg via INTRAVENOUS
  Filled 2020-08-31: qty 10

## 2020-08-31 MED ORDER — SODIUM CHLORIDE 0.9 % IV SOLN
Freq: Once | INTRAVENOUS | Status: AC
  Filled 2020-08-31: qty 250

## 2020-08-31 NOTE — Progress Notes (Signed)
Pt tolerated infusion well. Pt and VS stable at discharge with no s/s of distress or reaction noted.

## 2020-08-31 NOTE — Progress Notes (Signed)
[place]  [phone] [phone]) [phone]  ID: Rebecca Lang OB: June 19, 1976  MR#: [id number]  CSN#:[id number]  Patient Care Team: Doreen Beam, FNP as PCP - General (Family Medicine) Lloyd Huger, MD as Consulting Physician (Hematology and Oncology)  CHIEF COMPLAINT: Iron deficiency and B12 deficiency anemia.  INTERVAL HISTORY: Patient returns to clinic today for repeat laboratory, further evaluation, and can continuation of IV Feraheme.  She was last seen in clinic on 10/17/2019.  She last received IV Feraheme on 10/17/2019.  Rebecca Lang is doing well.  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  She has follow-up with her gynecologist in April.  She is very anxious about this.  Admits to chronic fatigue and weakness secondary to low iron and B12 levels.  She has not received a B12 injection in quite a while secondary to not being seen at the weight loss clinic.  She is curious what her B12 levels are today.  She has no neurologic complaints.  She denies any recent fevers or illnesses.  She has a good appetite and denies weight loss.  She denies any chest pain, shortness of breath, cough, or hemoptysis.  She denies any nausea, vomiting, constipation, or diarrhea.  She denies any melena or hematochezia.  She has no urinary complaints.  Patient offers no specific complaints today.  REVIEW OF SYSTEMS:   Review of Systems  Constitutional: Positive for malaise/fatigue. Negative for fever and weight loss.  Respiratory: Negative.  Negative for cough and shortness of breath.   Cardiovascular: Negative.  Negative for chest pain and leg swelling.  Gastrointestinal: Negative.  Negative for abdominal pain, blood in stool and melena.  Genitourinary: Negative.  Negative for dysuria and hematuria.  Musculoskeletal: Negative.  Negative for back pain.  Skin: Negative.  Negative for rash.  Neurological: Positive for weakness. Negative for focal  weakness and headaches.  Psychiatric/Behavioral: The patient is nervous/anxious.     As per HPI. Otherwise, a complete review of systems is negative.  PAST MEDICAL HISTORY: Past Medical History:  Diagnosis Date  . Anemia   . Anxiety   . Back pain   . Depression   . Hypertrophic cardiomyopathy ([place])     PAST SURGICAL HISTORY: Past Surgical History:  Procedure Laterality Date  . ABDOMINAL SURGERY  2004   gastric bypass  . CESAREAN SECTION    . DIAGNOSTIC LAPAROSCOPY WITH REMOVAL OF ECTOPIC PREGNANCY Right 12/03/2018   Procedure: DIAGNOSTIC LAPAROSCOPY WITH REMOVAL OF RIGHT ECTOPIC PREGNANCY AND RIGHT OVARY;  Surgeon: Benjaman Kindler, MD;  Location: [facility] ORS;  Service: Gynecology;  Laterality: Right;  . INNER EAR SURGERY    . LAPAROSCOPIC GASTRIC BYPASS  2004  . tubal rupture      FAMILY HISTORY: Family History  Problem Relation Age of Onset  . Arthritis Mother   . Other Mother        back pain w stimulator ( internal )  . Kidney disease Father   . Intestinal polyp Father   . Behavior problems Sister   . Diabetes Maternal Grandfather   . Kidney disease Paternal Grandmother   . Cancer Paternal Grandfather     ADVANCED DIRECTIVES (Y/N):  N  HEALTH MAINTENANCE: Social History   Tobacco Use  . Smoking status: Passive Smoke Exposure - Never Smoker  . Smokeless tobacco: Never Used  Vaping Use  . Vaping Use: Never used  Substance Use Topics  . Alcohol use: Yes    Comment: socially beer  .  Drug use: No     Colonoscopy:  PAP:  Bone density:  Lipid panel:  Allergies  Allergen Reactions  . Clindamycin/Lincomycin     Rash    Current Outpatient Medications  Medication Sig Dispense Refill  . blood glucose meter kit and supplies Check blood glucose fasting in am Dispense based on patient and insurance preference. Use up to four times daily as directed. (FOR ICD-10 E10.9, E11.9). 1 each 0  . buPROPion (WELLBUTRIN SR) 150 MG 12 hr tablet Take 1 tablet (150 mg  total) by mouth 2 (two) times daily. Start with 150 mg po in  am for 3 days then increased to BID as above. 60 tablet 0  . Cyanocobalamin (B-12 COMPLIANCE INJECTION) 1000 MCG/ML KIT Inject 1,000 mcg as directed every 14 (fourteen) days.     . phentermine (ADIPEX-P) 37.5 MG tablet Take 37.5 mg by mouth every morning.    . triamcinolone ointment (KENALOG) 0.5 % Apply 1 application topically 2 (two) times daily. 30 g 2  . UNABLE TO FIND Lipotropic injections given every 2 weeks     No current facility-administered medications for this visit.    OBJECTIVE: There were no vitals filed for this visit.   There is no height or weight on file to calculate BMI.    ECOG FS:0 - Asymptomatic  Physical Exam Constitutional:      General: Vital signs are normal.     Appearance: Normal appearance.  HENT:     Head: Normocephalic and atraumatic.  Eyes:     Pupils: Pupils are equal, round, and reactive to light.  Cardiovascular:     Rate and Rhythm: Normal rate and regular rhythm.     Heart sounds: Normal heart sounds. No murmur heard.   Pulmonary:     Effort: Pulmonary effort is normal.     Breath sounds: Normal breath sounds. No wheezing.  Abdominal:     General: Bowel sounds are normal. There is no distension.     Palpations: Abdomen is soft.     Tenderness: There is no abdominal tenderness.  Musculoskeletal:        General: No edema. Normal range of motion.     Cervical back: Normal range of motion.  Skin:    General: Skin is warm and dry.     Findings: No rash.  Neurological:     Mental Status: She is alert and oriented to person, place, and time.  Psychiatric:        Judgment: Judgment normal.     LAB RESULTS:  Lab Results  Component Value Date   NA 142 12/16/2019   K 4.3 12/16/2019   CL 111 (H) 12/16/2019   CO2 21 12/16/2019   GLUCOSE 90 12/16/2019   BUN 11 12/16/2019   CREATININE 0.61 12/16/2019   CALCIUM 9.1 01/18/2020   PROT 6.7 12/16/2019   ALBUMIN 4.1 12/16/2019    AST 14 12/16/2019   ALT 11 12/16/2019   ALKPHOS 95 12/16/2019   BILITOT 0.3 12/16/2019   GFRNONAA 113 12/16/2019   GFRAA 130 12/16/2019    Lab Results  Component Value Date   WBC 10.4 08/30/2020   NEUTROABS 7.3 08/30/2020   HGB 12.6 08/30/2020   HCT 37.9 08/30/2020   MCV 91.5 08/30/2020   PLT 329 08/30/2020   Lab Results  Component Value Date   IRON 81 08/30/2020   TIBC 398 08/30/2020   IRONPCTSAT 20 08/30/2020   Lab Results  Component Value Date   FERRITIN 9 (L)  08/30/2020     STUDIES: No results found.  ASSESSMENT: Iron deficiency and B12 deficiency anemia  PLAN:   1. Iron deficiency anemia:  -Secondary to poor absorption from gastric bypass. -Labs from 08/30/2020 show hemoglobin of 12.6, iron saturation 20% and a ferritin of 9 -Proceed with additional IV iron today. -RTC in 4 months for repeat labs (CBC, CMP, ferritin, iron), MD assessment and possible iron and B12 injection  2.  B12 deficiency:  -Patient was getting B12 injections from weight loss clinic. -She is interested in giving B12 injections to herself at home.  -Last B12 level is from 02/22/2020 which was normal at 386. -We will repeat B12 level today.  -If low will start B12 injections monthly and recheck B12 level at next visit. -Patient will self administer at home.  Disposition: -Proceed with IV Venofer X 5 doses.  -RTC in 4 months for repeat labs (CBC, CMP, ferritin, iron, B12 ), MD assessment and possible iron and B12 injection  Greater than 50% was spent in counseling and coordination of care with this patient including but not limited to discussion of the relevant topics above (See A&P) including, but not limited to diagnosis and management of acute and chronic medical conditions.   Patient expressed understanding and was in agreement with this plan. She also understands that She can call clinic at any time with any questions, concerns, or complaints.    Jacquelin Hawking, NP   08/31/2020  1:16 PM

## 2020-09-04 ENCOUNTER — Inpatient Hospital Stay: Payer: Medicaid Other

## 2020-09-05 ENCOUNTER — Inpatient Hospital Stay: Payer: Medicaid Other

## 2020-09-05 VITALS — BP 124/70 | HR 74 | Temp 97.8°F

## 2020-09-05 DIAGNOSIS — D509 Iron deficiency anemia, unspecified: Secondary | ICD-10-CM | POA: Diagnosis not present

## 2020-09-05 MED ORDER — SODIUM CHLORIDE 0.9 % IV SOLN
Freq: Once | INTRAVENOUS | Status: AC
  Filled 2020-09-05: qty 250

## 2020-09-05 MED ORDER — SODIUM CHLORIDE 0.9 % IV SOLN: 200.0000 mg | Freq: Once | INTRAVENOUS | Status: DC

## 2020-09-05 MED ORDER — IRON SUCROSE 20 MG/ML IV SOLN
200.0000 mg | Freq: Once | INTRAVENOUS | Status: AC
  Administered 2020-09-05: 200 mg via INTRAVENOUS
  Filled 2020-09-05: qty 10

## 2020-09-11 ENCOUNTER — Inpatient Hospital Stay: Payer: Medicaid Other | Attending: Oncology

## 2020-11-15 ENCOUNTER — Other Ambulatory Visit: Payer: Self-pay | Admitting: Family Medicine

## 2020-11-15 DIAGNOSIS — F320 Major depressive disorder, single episode, mild: Secondary | ICD-10-CM

## 2020-11-15 NOTE — Telephone Encounter (Signed)
Please advise refill? 

## 2020-11-15 NOTE — Telephone Encounter (Signed)
[facility] faxed refill request for the following medications:  buPROPion (WELLBUTRIN SR) 150 MG 12 hr tablet  Please advise. Thanks TNP  

## 2020-11-19 MED ORDER — BUPROPION HCL ER (SR) 150 MG PO TB12: 150.0000 mg | ORAL_TABLET | Freq: Two times a day (BID) | ORAL | 0 refills | Status: DC

## 2020-12-13 ENCOUNTER — Ambulatory Visit
Admission: EM | Admit: 2020-12-13 | Discharge: 2020-12-13 | Disposition: A | Discharge status: Home / Self Care | Payer: Medicaid Other | Attending: Emergency Medicine | Admitting: Emergency Medicine

## 2020-12-13 ENCOUNTER — Other Ambulatory Visit: Payer: Self-pay

## 2020-12-13 DIAGNOSIS — L239 Allergic contact dermatitis, unspecified cause: Secondary | ICD-10-CM | POA: Diagnosis not present

## 2020-12-13 MED ORDER — PREDNISONE 10 MG (21) PO TBPK: ORAL_TABLET | Freq: Every day | ORAL | 0 refills | Status: DC

## 2020-12-13 NOTE — ED Triage Notes (Signed)
Pt sts she have an insect bite to R hand. sts this morning the area turned into a blister.

## 2020-12-13 NOTE — Discharge Instructions (Signed)
Take the prednisone as directed.    Take Benadryl every 6 hours as directed; do not drive, operate machinery, or drink alcohol with this medication as it may cause drowsiness.  If you need to be awake and alert, take Claritin or Allegra as directed.    Follow up with your primary care provider if your symptoms are not improving.

## 2020-12-13 NOTE — ED Provider Notes (Signed)
[name]    CSN: [id number] Arrival date & time: 12/13/20  1423      History   Chief Complaint Chief Complaint  Patient presents with  . Insect Bite    HPI Rebecca Lang is a 45 y.o. female.   Patient presents with blisterlike rash on her right hand.  The rash has been present for 6 days.  She reports she was treated for poison ivy a month ago.  She does not think the poison ivy completely cleared.  No new medications, products, foods.  No treatment attempted at home.  No fever, drainage, or other symptoms.  Her medical history includes cardiomyopathy, anemia, back pain, depression, anxiety.  The history is provided by the patient and medical records.    Past Medical History:  Diagnosis Date  . Anemia   . Anxiety   . Back pain   . Depression   . Hypertrophic cardiomyopathy [facility])     Patient Active Problem List   Diagnosis Date Noted  . Galactorrhea of both breasts 01/18/2020  . Light headedness 01/18/2020  . Vitamin D insufficiency 01/18/2020  . Alcohol use 01/18/2020  . Hypoglycemia 01/18/2020  . Menorrhagia with regular cycle 12/16/2019  . Fracture of foot 12/16/2019  . BMI 33.0-33.9,adult 12/16/2019  . Blood sugar increased 12/16/2019  . Encounter for screening mammogram for malignant neoplasm of breast 12/16/2019  . History of Roux-en-Y gastric bypass- around age 66 12/16/2019  . Adverse food reaction 12/16/2019  . Depression, major, single episode, mild ([place]) 12/16/2019  . History of ectopic pregnancy 12/16/2019  . Ectopic pregnancy, tubal 12/03/2018  . Iron deficiency anemia 03/02/2018    Past Surgical History:  Procedure Laterality Date  . ABDOMINAL SURGERY  2004   gastric bypass  . CESAREAN SECTION    . DIAGNOSTIC LAPAROSCOPY WITH REMOVAL OF ECTOPIC PREGNANCY Right 12/03/2018   Procedure: DIAGNOSTIC LAPAROSCOPY WITH REMOVAL OF RIGHT ECTOPIC PREGNANCY AND RIGHT OVARY;  Surgeon: Benjaman Kindler, MD;  Location: [facility] ORS;  Service:  Gynecology;  Laterality: Right;  . INNER EAR SURGERY    . LAPAROSCOPIC GASTRIC BYPASS  2004  . tubal rupture      OB History    Gravida  2   Para      Term      Preterm      AB      Living        SAB      IAB      Ectopic      Multiple      Live Births               Home Medications    Prior to Admission medications   Medication Sig Start Date End Date Taking? Authorizing Provider  predniSONE (STERAPRED UNI-PAK 21 TAB) 10 MG (21) TBPK tablet Take by mouth daily. As directed 12/13/20  Yes Sharion Balloon, NP  blood glucose meter kit and supplies Check blood glucose fasting in am Dispense based on patient and insurance preference. Use up to four times daily as directed. (FOR ICD-10 E10.9, E11.9). 01/18/20   Flinchum, Kelby Aline, FNP  buPROPion (WELLBUTRIN SR) 150 MG 12 hr tablet Take 1 tablet (150 mg total) by mouth 2 (two) times daily. Start with 150 mg po in  am for 3 days then increased to BID as above. 11/19/20 12/19/20  Jerrol Banana., MD  cyanocobalamin (,VITAMIN B-12,) 1000 MCG/ML injection Inject 1 mL (1,000 mcg total) into the muscle every  30 (thirty) days. 08/31/20   Jacquelin Hawking, NP  Cyanocobalamin (B-12 COMPLIANCE INJECTION) 1000 MCG/ML KIT Inject 1,000 mcg as directed every 14 (fourteen) days.  Patient not taking: Reported on 08/31/2020    [provider]  norethindrone-ethinyl estradiol (LOESTRIN) 1-20 MG-MCG tablet Take 1 tablet by mouth daily. 08/16/20   [provider]  phentermine (ADIPEX-P) 37.5 MG tablet Take 37.5 mg by mouth every morning. Patient not taking: Reported on 08/31/2020 08/18/19   [provider]  triamcinolone ointment (KENALOG) 0.5 % Apply 1 application topically 2 (two) times daily. Patient not taking: Reported on 08/31/2020 02/22/20   Virginia Crews, MD  UNABLE TO FIND Lipotropic injections given every 2 weeks Patient not taking: Reported on 08/31/2020    [provider]    Family  History Family History  Problem Relation Age of Onset  . Arthritis Mother   . Other Mother        back pain w stimulator ( internal )  . Kidney disease Father   . Intestinal polyp Father   . Behavior problems Sister   . Diabetes Maternal Grandfather   . Kidney disease Paternal Grandmother   . Cancer Paternal Grandfather     Social History Social History   Tobacco Use  . Smoking status: Passive Smoke Exposure - Never Smoker  . Smokeless tobacco: Never Used  Vaping Use  . Vaping Use: Never used  Substance Use Topics  . Alcohol use: Yes    Comment: socially beer  . Drug use: No     Allergies   Clindamycin/lincomycin   Review of Systems Review of Systems  Constitutional: Negative for chills and fever.  HENT: Negative for ear pain and sore throat.   Respiratory: Negative for cough and shortness of breath.   Cardiovascular: Negative for chest pain and palpitations.  Gastrointestinal: Negative for abdominal pain and vomiting.  Skin: Positive for rash. Negative for color change.  Neurological: Negative for weakness and numbness.  All other systems reviewed and are negative.    Physical Exam Triage Vital Signs ED Triage Vitals  Enc Vitals Group     BP      Pulse      Resp      Temp      Temp src      SpO2      Weight      Height      Head Circumference      Peak Flow      Pain Score      Pain Loc      Pain Edu?      Excl. in [place]?    No data found.  Updated Vital Signs BP 138/88   Pulse 93   Temp 98.2 F (36.8 C) (Oral)   Resp 16   Ht 5' 4"  (1.626 m)   Wt 190 lb (86.2 kg)   SpO2 98%   BMI 32.61 kg/m   Visual Acuity Right Eye Distance:   Left Eye Distance:   Bilateral Distance:    Right Eye Near:   Left Eye Near:    Bilateral Near:     Physical Exam Vitals and nursing note reviewed.  Constitutional:      General: She is not in acute distress.    Appearance: She is well-developed.  HENT:     Head: Normocephalic and atraumatic.      Mouth/Throat:     Mouth: Mucous membranes are moist.  Eyes:     Conjunctiva/sclera: Conjunctivae  normal.  Cardiovascular:     Rate and Rhythm: Normal rate and regular rhythm.     Heart sounds: Normal heart sounds.  Pulmonary:     Effort: Pulmonary effort is normal. No respiratory distress.     Breath sounds: Normal breath sounds.  Abdominal:     Palpations: Abdomen is soft.     Tenderness: There is no abdominal tenderness.  Musculoskeletal:     Cervical back: Neck supple.  Skin:    General: Skin is warm and dry.     Findings: Rash present.  Neurological:     General: No focal deficit present.     Mental Status: She is alert and oriented to person, place, and time.     Gait: Gait normal.  Psychiatric:        Mood and Affect: Mood normal.        Behavior: Behavior normal.            UC Treatments / Results  Labs (all labs ordered are listed, but only abnormal results are displayed) Labs Reviewed - No data to display  EKG   Radiology No results found.  Procedures Procedures (including critical care time)  Medications Ordered in UC Medications - No data to display  Initial Impression / Assessment and Plan / UC Course  I have reviewed the triage vital signs and the nursing notes.  Pertinent labs & imaging results that were available during my care of the patient were reviewed by me and considered in my medical decision making (see chart for details).   Contact dermatitis.  Treating with prednisone taper and Benadryl.  Precautions for drowsiness with Benadryl discussed.  Instructed patient to follow-up with her PCP if her symptoms are not improving.  She agrees to plan of care.   Final Clinical Impressions(s) / UC Diagnoses   Final diagnoses:  Allergic contact dermatitis, unspecified trigger     Discharge Instructions     Take the prednisone as directed.    Take Benadryl every 6 hours as directed; do not drive, operate machinery, or drink alcohol with  this medication as it may cause drowsiness.  If you need to be awake and alert, take Claritin or Allegra as directed.    Follow up with your primary care provider if your symptoms are not improving.          ED Prescriptions    Medication Sig Dispense Auth. Provider   predniSONE (STERAPRED UNI-PAK 21 TAB) 10 MG (21) TBPK tablet Take by mouth daily. As directed 21 tablet Sharion Balloon, NP     PDMP not reviewed this encounter.   Sharion Balloon, NP 12/13/20 1450

## 2020-12-31 ENCOUNTER — Inpatient Hospital Stay: Payer: Medicaid Other | Attending: Oncology

## 2020-12-31 DIAGNOSIS — E538 Deficiency of other specified B group vitamins: Secondary | ICD-10-CM | POA: Diagnosis not present

## 2020-12-31 DIAGNOSIS — D509 Iron deficiency anemia, unspecified: Secondary | ICD-10-CM | POA: Diagnosis present

## 2020-12-31 DIAGNOSIS — Z79899 Other long term (current) drug therapy: Secondary | ICD-10-CM | POA: Insufficient documentation

## 2020-12-31 DIAGNOSIS — M549 Dorsalgia, unspecified: Secondary | ICD-10-CM | POA: Insufficient documentation

## 2020-12-31 DIAGNOSIS — Z7952 Long term (current) use of systemic steroids: Secondary | ICD-10-CM | POA: Insufficient documentation

## 2020-12-31 DIAGNOSIS — I422 Other hypertrophic cardiomyopathy: Secondary | ICD-10-CM | POA: Diagnosis not present

## 2020-12-31 LAB — FERRITIN: Ferritin: 22 ng/mL (ref 11–307)

## 2020-12-31 LAB — CBC WITH DIFFERENTIAL/PLATELET
Abs Immature Granulocytes: 0.02 10*3/uL (ref 0.00–0.07)
Basophils Absolute: 0 10*3/uL (ref 0.0–0.1)
Basophils Relative: 0 %
Eosinophils Absolute: 0 10*3/uL (ref 0.0–0.5)
Eosinophils Relative: 0 %
HCT: 40.1 % (ref 36.0–46.0)
Hemoglobin: 13.5 g/dL (ref 12.0–15.0)
Immature Granulocytes: 0 %
Lymphocytes Relative: 21 %
Lymphs Abs: 1.6 10*3/uL (ref 0.7–4.0)
MCH: 31.3 pg (ref 26.0–34.0)
MCHC: 33.7 g/dL (ref 30.0–36.0)
MCV: 93 fL (ref 80.0–100.0)
Monocytes Absolute: 0.5 10*3/uL (ref 0.1–1.0)
Monocytes Relative: 6 %
Neutro Abs: 5.3 10*3/uL (ref 1.7–7.7)
Neutrophils Relative %: 73 %
Platelets: 311 10*3/uL (ref 150–400)
RBC: 4.31 MIL/uL (ref 3.87–5.11)
RDW: 13.2 % (ref 11.5–15.5)
WBC: 7.4 10*3/uL (ref 4.0–10.5)
nRBC: 0 % (ref 0.0–0.2)

## 2020-12-31 LAB — IRON AND TIBC
Iron: 139 ug/dL (ref 28–170)
Saturation Ratios: 36 % — ABNORMAL HIGH (ref 10.4–31.8)
TIBC: 384 ug/dL (ref 250–450)
UIBC: 245 ug/dL

## 2020-12-31 LAB — VITAMIN B12: Vitamin B-12: 169 pg/mL — ABNORMAL LOW (ref 180–914)

## 2021-01-01 ENCOUNTER — Encounter: Payer: Self-pay | Admitting: Oncology

## 2021-01-01 ENCOUNTER — Other Ambulatory Visit: Payer: Self-pay

## 2021-01-01 ENCOUNTER — Inpatient Hospital Stay: Payer: Medicaid Other

## 2021-01-01 ENCOUNTER — Inpatient Hospital Stay (HOSPITAL_BASED_OUTPATIENT_CLINIC_OR_DEPARTMENT_OTHER): Payer: Medicaid Other | Admitting: Oncology

## 2021-01-01 VITALS — BP 130/82 | HR 61 | Temp 97.6°F | Resp 18 | Ht 64.0 in | Wt 191.7 lb

## 2021-01-01 DIAGNOSIS — D509 Iron deficiency anemia, unspecified: Secondary | ICD-10-CM | POA: Diagnosis not present

## 2021-01-01 MED ORDER — B-12 COMPLIANCE INJECTION 1000 MCG/ML IJ KIT: 1000.0000 ug | PACK | INTRAMUSCULAR | 3 refills | Status: DC

## 2021-01-01 NOTE — Progress Notes (Signed)
[place]  [phone] [phone]  ID: Rebecca Lang OB: [birth year]  MR#: [id number]  PXT#:[id number]  Patient Care Team: Doreen Beam, FNP as PCP - General (Family Medicine) Lloyd Huger, MD as Consulting Physician (Hematology and Oncology)  CHIEF COMPLAINT: Iron deficiency and B12 deficiency anemia.  INTERVAL HISTORY: Patient returns to clinic today for repeat laboratory, further evaluation, and consideration of additional IV Venofer.  She currently feels well and is asymptomatic.  She does not complain of any weakness or fatigue today.  She has no neurologic complaints.  She denies any recent fevers or illnesses.  She has a good appetite and denies weight loss.  She denies any chest pain, shortness of breath, cough, or hemoptysis.  She denies any nausea, vomiting, constipation, or diarrhea.  She denies any melena or hematochezia.  She has no urinary complaints.  Patient feels at her baseline offers no specific complaints today.  REVIEW OF SYSTEMS:   Review of Systems  Constitutional: Negative.  Negative for fever, malaise/fatigue and weight loss.  Respiratory: Negative.  Negative for cough and shortness of breath.   Cardiovascular: Negative.  Negative for chest pain and leg swelling.  Gastrointestinal: Negative.  Negative for abdominal pain, blood in stool and melena.  Genitourinary: Negative.  Negative for dysuria and hematuria.  Musculoskeletal: Negative.  Negative for back pain.  Skin: Negative.  Negative for rash.  Neurological: Negative.  Negative for focal weakness, weakness and headaches.  Psychiatric/Behavioral: Negative.  The patient is not nervous/anxious.    As per HPI. Otherwise, a complete review of systems is negative.  PAST MEDICAL HISTORY: Past Medical History:  Diagnosis Date   Anemia    Anxiety    Back pain    Depression    Hypertrophic cardiomyopathy ([place])     PAST SURGICAL HISTORY: Past Surgical  History:  Procedure Laterality Date   ABDOMINAL SURGERY  2004   gastric bypass   CESAREAN SECTION     DIAGNOSTIC LAPAROSCOPY WITH REMOVAL OF ECTOPIC PREGNANCY Right 12/03/2018   Procedure: DIAGNOSTIC LAPAROSCOPY WITH REMOVAL OF RIGHT ECTOPIC PREGNANCY AND RIGHT OVARY;  Surgeon: Benjaman Kindler, MD;  Location: [facility] ORS;  Service: Gynecology;  Laterality: Right;   INNER EAR SURGERY     LAPAROSCOPIC GASTRIC BYPASS  2004   tubal rupture      FAMILY HISTORY: Family History  Problem Relation Age of Onset   Arthritis Mother    Other Mother        back pain w stimulator ( internal )   Kidney disease Father    Intestinal polyp Father    Behavior problems Sister    Diabetes Maternal Grandfather    Kidney disease Paternal Grandmother    Cancer Paternal Grandfather     ADVANCED DIRECTIVES (Y/N):  N  HEALTH MAINTENANCE: Social History   Tobacco Use   Smoking status: Never    Passive exposure: Yes   Smokeless tobacco: Never  Vaping Use   Vaping Use: Never used  Substance Use Topics   Alcohol use: Yes    Comment: socially beer   Drug use: No     Colonoscopy:  PAP:  Bone density:  Lipid panel:  Allergies  Allergen Reactions   Clindamycin/Lincomycin     Rash    Current Outpatient Medications  Medication Sig Dispense Refill   blood glucose meter kit and supplies Check blood glucose fasting in am Dispense based on patient and insurance preference. Use up to four times daily as directed. (FOR  ICD-10 E10.9, E11.9). 1 each 0   norethindrone-ethinyl estradiol (LOESTRIN) 1-20 MG-MCG tablet Take 1 tablet by mouth daily.     predniSONE (STERAPRED UNI-PAK 21 TAB) 10 MG (21) TBPK tablet Take by mouth daily. As directed 21 tablet 0   triamcinolone ointment (KENALOG) 0.5 % Apply 1 application topically 2 (two) times daily. 30 g 2   UNABLE TO FIND Lipotropic injections given every 2 weeks     buPROPion (WELLBUTRIN SR) 150 MG 12 hr tablet Take 1 tablet (150 mg total) by mouth 2 (two)  times daily. Start with 150 mg po in  am for 3 days then increased to BID as above. 60 tablet 0   Cyanocobalamin (B-12 COMPLIANCE INJECTION) 1000 MCG/ML KIT Inject 1,000 mcg as directed every 14 (fourteen) days. 4 kit 3   phentermine (ADIPEX-P) 37.5 MG tablet Take 37.5 mg by mouth every morning. (Patient not taking: Reported on 08/31/2020)     No current facility-administered medications for this visit.    OBJECTIVE: Vitals:   01/01/21 1313  BP: 130/82  Pulse: 61  Resp: 18  Temp: 97.6 F (36.4 C)  SpO2: 100%     Body mass index is 32.91 kg/m.    ECOG FS:0 - Asymptomatic   General: Well-developed, well-nourished, no acute distress. Eyes: Pink conjunctiva, anicteric sclera. HEENT: Normocephalic, moist mucous membranes. Lungs: No audible wheezing or coughing. Heart: Regular rate and rhythm. Abdomen: Soft, nontender, no obvious distention. Musculoskeletal: No edema, cyanosis, or clubbing. Neuro: Alert, answering all questions appropriately. Cranial nerves grossly intact. Skin: No rashes or petechiae noted. Psych: Normal affect.   LAB RESULTS:  Lab Results  Component Value Date   NA 142 12/16/2019   K 4.3 12/16/2019   CL 111 (H) 12/16/2019   CO2 21 12/16/2019   GLUCOSE 90 12/16/2019   BUN 11 12/16/2019   CREATININE 0.61 12/16/2019   CALCIUM 9.1 01/18/2020   PROT 6.7 12/16/2019   ALBUMIN 4.1 12/16/2019   AST 14 12/16/2019   ALT 11 12/16/2019   ALKPHOS 95 12/16/2019   BILITOT 0.3 12/16/2019   GFRNONAA 113 12/16/2019   GFRAA 130 12/16/2019    Lab Results  Component Value Date   WBC 7.4 12/31/2020   NEUTROABS 5.3 12/31/2020   HGB 13.5 12/31/2020   HCT 40.1 12/31/2020   MCV 93.0 12/31/2020   PLT 311 12/31/2020   Lab Results  Component Value Date   IRON 139 12/31/2020   TIBC 384 12/31/2020   IRONPCTSAT 36 (H) 12/31/2020   Lab Results  Component Value Date   FERRITIN 22 12/31/2020     STUDIES: No results found.  ASSESSMENT: Iron deficiency and B12  deficiency anemia  PLAN:   1. Iron deficiency anemia: Likely secondary to poor absorption from patient's history of gastric bypass surgery.  Previously, the remainder of her laboratory work was either negative or within normal limits.  Patient's hemoglobin and iron stores continue to be within normal limits.  She does not require additional IV Venofer today.  Patient last received treatment on September 05, 2020.  Return to clinic in 4 months with repeat laboratory, further evaluation, and continuation of treatment if necessary.   2.  B12 deficiency: Patient's B12 levels are decreased.  Increase B12 injections at home to every 2 weeks.  Patient was given a prescription today.    Patient expressed understanding and was in agreement with this plan. She also understands that She can call clinic at any time with any questions, concerns, or complaints.  Lloyd Huger, MD   01/02/2021 1:59 PM

## 2021-01-01 NOTE — Progress Notes (Signed)
Pt sts does not have feeling in the rt leg and the left not so much ... she rubs and does not feel anything she know she is doing it but does not feel it .Marland KitchenMarland KitchenMarland Kitchen

## 2021-01-02 ENCOUNTER — Encounter: Payer: Self-pay | Admitting: Oncology

## 2021-03-19 NOTE — Progress Notes (Signed)
Established patient visit   Patient: Rebecca Lang   DOB: [birth year]   45 y.o. Female  MRN: [id number] Visit Date: 03/20/2021  Today's healthcare provider: Gwyneth Sprout, FNP   Chief Complaint  Patient presents with   concentration   Subjective    HPI  Depression, Follow-up  She  was last seen for this 1 years ago. Changes made at last visit include none, continue Wellbutrin.   She reports good compliance with treatment. She is not having side effects. none  She reports good tolerance of treatment. Current symptoms include:  none She feels she is Improved since last visit. (Patient states she also was going through difficult divorce and thinks that played a factor)  Depression screen [facility] 2/9 03/20/2021 03/20/2020 12/16/2019  Decreased Interest 0 0 1  Down, Depressed, Hopeless 0 0 1  PHQ - 2 Score 0 0 2  Altered sleeping 2 3 2   Tired, decreased energy 1 3 1   Change in appetite 3 0 0  Feeling bad or failure about yourself  - 0 0  Trouble concentrating 3 3 3   Moving slowly or fidgety/restless 0 0 0  Suicidal thoughts 0 0 0  PHQ-9 Score 9 9 8   Difficult doing work/chores Extremely dIfficult Not difficult at all Very difficult    -----------------------------------------------------------------------------------------   Medications: Outpatient Medications Prior to Visit  Medication Sig   blood glucose meter kit and supplies Check blood glucose fasting in am Dispense based on patient and insurance preference. Use up to four times daily as directed. (FOR ICD-10 E10.9, E11.9).   Cyanocobalamin (B-12 COMPLIANCE INJECTION) 1000 MCG/ML KIT Inject 1,000 mcg as directed every 14 (fourteen) days.   [DISCONTINUED] norethindrone-ethinyl estradiol (LOESTRIN) 1-20 MG-MCG tablet Take 1 tablet by mouth daily.   [DISCONTINUED] UNABLE TO FIND Lipotropic injections given every 2 weeks   buPROPion (WELLBUTRIN SR) 150 MG 12 hr tablet Take 1 tablet (150 mg total) by mouth 2 (two) times  daily. Start with 150 mg po in  am for 3 days then increased to BID as above.   [DISCONTINUED] phentermine (ADIPEX-P) 37.5 MG tablet Take 37.5 mg by mouth every morning. (Patient not taking: Reported on 03/20/2021)   [DISCONTINUED] predniSONE (STERAPRED UNI-PAK 21 TAB) 10 MG (21) TBPK tablet Take by mouth daily. As directed (Patient not taking: Reported on 03/20/2021)   [DISCONTINUED] triamcinolone ointment (KENALOG) 0.5 % Apply 1 application topically 2 (two) times daily. (Patient not taking: Reported on 03/20/2021)   No facility-administered medications prior to visit.    Review of Systems    Objective    BP 129/77 (BP Location: Right Arm, Patient Position: Sitting, Cuff Size: Large)   Pulse (!) 54   Temp 98.4 F (36.9 C) (Oral)   Ht 5' 5"  (1.651 m)   Wt 204 lb 14.4 oz (92.9 kg)   LMP 03/10/2021 Comment: 3 days  SpO2 100%   BMI 34.10 kg/m   Repeat HR 49 bpm EKG done- SB with low voltage precordial leads- abnormal  Physical Exam Vitals and nursing note reviewed.  Constitutional:      General: She is not in acute distress.    Appearance: Normal appearance. She is obese. She is not ill-appearing, toxic-appearing or diaphoretic.  HENT:     Head: Normocephalic and atraumatic.  Cardiovascular:     Rate and Rhythm: Regular rhythm. Bradycardia present.     Pulses: Normal pulses.     Heart sounds: Normal heart sounds. No murmur heard.  No friction rub. No gallop.  Pulmonary:     Effort: Pulmonary effort is normal. No respiratory distress.     Breath sounds: Normal breath sounds. No stridor. No wheezing, rhonchi or rales.  Chest:     Chest wall: No tenderness.  Abdominal:     General: Bowel sounds are normal.     Palpations: Abdomen is soft.  Musculoskeletal:        General: No swelling, tenderness, deformity or signs of injury. Normal range of motion.     Right lower leg: No edema.     Left lower leg: No edema.  Skin:    General: Skin is warm and dry.     Capillary Refill:  Capillary refill takes less than 2 seconds.     Coloration: Skin is not jaundiced or pale.     Findings: No bruising, erythema, lesion or rash.  Neurological:     General: No focal deficit present.     Mental Status: She is alert and oriented to person, place, and time. Mental status is at baseline.     Cranial Nerves: No cranial nerve deficit.     Sensory: No sensory deficit.     Motor: No weakness.     Coordination: Coordination normal.  Psychiatric:        Attention and Perception: Attention normal.        Mood and Affect: Mood is anxious.        Speech: Speech is tangential.        Behavior: Behavior normal. Behavior is cooperative.        Thought Content: Thought content normal.        Cognition and Memory: Cognition and memory normal.        Judgment: Judgment normal.   No results found for any visits on 03/20/21.  Assessment & Plan     Problem List Items Addressed This Visit       Other   Iron deficiency anemia    B12 deficiency; known concern Receiving injections Repeat lab work today      Bradycardia    HR 50's on initial exam Repeat in high 40's Patient endorses fatigue No additional s/s Lab work drawn today EKG done- low voltage, SB Referral placed to Cardiology      Relevant Orders   EKG 12-Lead (Completed)   Ambulatory referral to Cardiology   Attention and concentration deficit - Primary    In school for [employer] to focus to get school work done- ongoing concern since January Difficult getting information from brain to paper Difficult focus to read without getting distracted Was late this am for appt Often turns work in late Start XR medication- advised 1 month f/u      Relevant Medications   dexmethylphenidate (FOCALIN XR) 10 MG 24 hr capsule   Other Relevant Orders   Comprehensive metabolic panel   Lipid panel   TSH   T4, free   Vitamin B12   VITAMIN D 25 Hydroxy (Vit-D Deficiency, Fractures)   Hemoglobin D4K   Basic  metabolic panel   Fatigue    Generalized complaints Denies sleep difficulties  Reports balanced strict diet      Relevant Orders   Ambulatory referral to Cardiology   BMI 34.0-34.9,adult    BMI increase since previous visit Routine lab working including A1c and TSH      Depression, major, single episode, in partial remission ([place])    Wellbutrin starting during divorce; doing well on dose Rx  is expired on our end; however, pt reports supply and 'new bottle'      Weight gain    Weight has increased despite "strict diet" Does not call herself 'an athlete' but routine exercise        Return in about 4 weeks (around 04/17/2021) for chonic disease management.      Vonna Kotyk, FNP, have reviewed all documentation for this visit. The documentation on 03/20/21 for the exam, diagnosis, procedures, and orders are all accurate and complete.    Gwyneth Sprout, [place] [phone] (phone) [phone] (fax)  [place]

## 2021-03-20 ENCOUNTER — Ambulatory Visit (INDEPENDENT_AMBULATORY_CARE_PROVIDER_SITE_OTHER): Payer: Medicaid Other | Admitting: Family Medicine

## 2021-03-20 ENCOUNTER — Other Ambulatory Visit: Payer: Self-pay

## 2021-03-20 ENCOUNTER — Encounter: Payer: Self-pay | Admitting: Family Medicine

## 2021-03-20 VITALS — BP 129/77 | HR 54 | Temp 98.4°F | Ht 65.0 in | Wt 204.9 lb

## 2021-03-20 DIAGNOSIS — R001 Bradycardia, unspecified: Secondary | ICD-10-CM | POA: Diagnosis not present

## 2021-03-20 DIAGNOSIS — R41840 Attention and concentration deficit: Secondary | ICD-10-CM | POA: Insufficient documentation

## 2021-03-20 DIAGNOSIS — Z6834 Body mass index (BMI) 34.0-34.9, adult: Secondary | ICD-10-CM | POA: Insufficient documentation

## 2021-03-20 DIAGNOSIS — R5383 Other fatigue: Secondary | ICD-10-CM | POA: Diagnosis not present

## 2021-03-20 DIAGNOSIS — R635 Abnormal weight gain: Secondary | ICD-10-CM | POA: Insufficient documentation

## 2021-03-20 DIAGNOSIS — D509 Iron deficiency anemia, unspecified: Secondary | ICD-10-CM

## 2021-03-20 DIAGNOSIS — F324 Major depressive disorder, single episode, in partial remission: Secondary | ICD-10-CM | POA: Insufficient documentation

## 2021-03-20 MED ORDER — DEXMETHYLPHENIDATE HCL ER 10 MG PO CP24: 20.0000 mg | ORAL_CAPSULE | Freq: Every day | ORAL | 0 refills | Status: DC

## 2021-03-20 NOTE — Assessment & Plan Note (Signed)
Generalized complaints Denies sleep difficulties  Reports balanced strict diet

## 2021-03-20 NOTE — Assessment & Plan Note (Addendum)
In school for [employer] Unable to focus to get school work done- ongoing concern since January Difficult getting information from brain to paper Difficult focus to read without getting distracted Was late this am for appt Often turns work in late Start XR medication- advised 1 month f/u

## 2021-03-20 NOTE — Assessment & Plan Note (Signed)
BMI increase since previous visit Routine lab working including A1c and TSH

## 2021-03-20 NOTE — Assessment & Plan Note (Signed)
Wellbutrin starting during divorce; doing well on dose Rx is expired on our end; however, pt reports supply and 'new bottle'

## 2021-03-20 NOTE — Assessment & Plan Note (Signed)
Weight has increased despite "strict diet" Does not call herself 'an athlete' but routine exercise

## 2021-03-20 NOTE — Assessment & Plan Note (Addendum)
HR 50's on initial exam Repeat in high 40's Patient endorses fatigue No additional s/s Lab work drawn today EKG done- low voltage, SB Referral placed to Cardiology

## 2021-03-20 NOTE — Assessment & Plan Note (Signed)
B12 deficiency; known concern Receiving injections Repeat lab work today

## 2021-03-21 ENCOUNTER — Telehealth: Payer: Self-pay | Admitting: Adult Health

## 2021-03-21 ENCOUNTER — Other Ambulatory Visit: Payer: Self-pay | Admitting: Family Medicine

## 2021-03-21 DIAGNOSIS — R41840 Attention and concentration deficit: Secondary | ICD-10-CM

## 2021-03-21 LAB — T4, FREE: Free T4: 0.99 ng/dL (ref 0.82–1.77)

## 2021-03-21 LAB — COMPREHENSIVE METABOLIC PANEL
ALT: 16 IU/L (ref 0–32)
AST: 18 IU/L (ref 0–40)
Albumin/Globulin Ratio: 1.6 (ref 1.2–2.2)
Albumin: 3.6 g/dL — ABNORMAL LOW (ref 3.8–4.8)
Alkaline Phosphatase: 72 IU/L (ref 44–121)
BUN/Creatinine Ratio: 18 (ref 9–23)
BUN: 12 mg/dL (ref 6–24)
Bilirubin Total: 0.5 mg/dL (ref 0.0–1.2)
CO2: 23 mmol/L (ref 20–29)
Calcium: 9.2 mg/dL (ref 8.7–10.2)
Chloride: 104 mmol/L (ref 96–106)
Creatinine, Ser: 0.66 mg/dL (ref 0.57–1.00)
Globulin, Total: 2.3 g/dL (ref 1.5–4.5)
Glucose: 80 mg/dL (ref 65–99)
Potassium: 4.2 mmol/L (ref 3.5–5.2)
Sodium: 139 mmol/L (ref 134–144)
Total Protein: 5.9 g/dL — ABNORMAL LOW (ref 6.0–8.5)
eGFR: 112 mL/min/{1.73_m2} (ref >59)

## 2021-03-21 LAB — HEMOGLOBIN A1C
Est. average glucose Bld gHb Est-mCnc: 100 mg/dL
Hgb A1c MFr Bld: 5.1 % (ref 4.8–5.6)

## 2021-03-21 LAB — LIPID PANEL
Chol/HDL Ratio: 2.3 ratio (ref 0.0–4.4)
Cholesterol, Total: 163 mg/dL (ref 100–199)
HDL: 71 mg/dL (ref >39)
LDL Chol Calc (NIH): 76 mg/dL (ref 0–99)
Triglycerides: 90 mg/dL (ref 0–149)
VLDL Cholesterol Cal: 16 mg/dL (ref 5–40)

## 2021-03-21 LAB — VITAMIN B12: Vitamin B-12: 387 pg/mL (ref 232–1245)

## 2021-03-21 LAB — TSH: TSH: 1.67 u[IU]/mL (ref 0.450–4.500)

## 2021-03-21 LAB — VITAMIN D 25 HYDROXY (VIT D DEFICIENCY, FRACTURES): Vit D, 25-Hydroxy: 29 ng/mL — ABNORMAL LOW (ref 30.0–100.0)

## 2021-03-21 MED ORDER — DEXMETHYLPHENIDATE HCL ER 10 MG PO CP24: 10.0000 mg | ORAL_CAPSULE | Freq: Every day | ORAL | 0 refills | Status: DC

## 2021-03-21 NOTE — Telephone Encounter (Signed)
For review. KW 

## 2021-03-21 NOTE — Telephone Encounter (Signed)
Melissa pharmacist at [facility] [address] in [place] phone number [phone] is calling to let elisa know the insurance will only cover focalin 1 capsule not 2 capsules the pt will get 7 day supply . Please advise

## 2021-03-25 ENCOUNTER — Other Ambulatory Visit: Payer: Self-pay

## 2021-03-25 ENCOUNTER — Encounter: Payer: Self-pay | Admitting: Cardiology

## 2021-03-25 ENCOUNTER — Ambulatory Visit (INDEPENDENT_AMBULATORY_CARE_PROVIDER_SITE_OTHER): Payer: Medicaid Other | Admitting: Cardiology

## 2021-03-25 VITALS — BP 134/72 | HR 60 | Ht 65.0 in | Wt 201.0 lb

## 2021-03-25 DIAGNOSIS — Z8679 Personal history of other diseases of the circulatory system: Secondary | ICD-10-CM

## 2021-03-25 DIAGNOSIS — R5383 Other fatigue: Secondary | ICD-10-CM

## 2021-03-25 NOTE — Patient Instructions (Signed)

## 2021-03-25 NOTE — Progress Notes (Signed)
Cardiology Office Note:    Date:  03/25/2021   ID:  Rebecca Lang, DOB September 15, 1975, MRN [id number]  PCP:  Doreen Beam, FNP   [facility] Providers Cardiologist:  Kate Sable, MD     Referring MD: Sharmon Leyden*   Chief Complaint  Patient presents with   New Patient (Initial Visit)    Referred by PCP bradycardia. Meds reviewed verbally with patient.     History of Present Illness:    Rebecca Lang is a 45 y.o. female with a hx of anemia who presents due to bradycardia.  Patient saw her primary care provider on 03/20/2021 for a clinic visit, heart rates were noted to be 50s on exam.  Repeat heart rates were high 40s.  She endorsed some fatigue.  EKG obtained showed sinus bradycardia, heart rate 51.  She states gaining 25 pounds over the past few months.  States not exercising as she previously did.  Endorses being very active and eating healthy but still not able to lose weight as she would like to.  Follows up at the cancer center for IV iron infusions to help manage her anemia.  Overall she states feeling heavy, denies chest pain, dizziness, syncope.  Past Medical History:  Diagnosis Date   Anemia    Anxiety    Back pain    Depression    Hypertrophic cardiomyopathy ([place])     Past Surgical History:  Procedure Laterality Date   ABDOMINAL SURGERY  2004   gastric bypass   CESAREAN SECTION     DIAGNOSTIC LAPAROSCOPY WITH REMOVAL OF ECTOPIC PREGNANCY Right 12/03/2018   Procedure: DIAGNOSTIC LAPAROSCOPY WITH REMOVAL OF RIGHT ECTOPIC PREGNANCY AND RIGHT OVARY;  Surgeon: Benjaman Kindler, MD;  Location: [facility] ORS;  Service: Gynecology;  Laterality: Right;   INNER EAR SURGERY     LAPAROSCOPIC GASTRIC BYPASS  2004   tubal rupture      Current Medications: Current Meds  Medication Sig   blood glucose meter kit and supplies Check blood glucose fasting in am Dispense based on patient and insurance preference. Use up to four times daily as directed.  (FOR ICD-10 E10.9, E11.9).   buPROPion (WELLBUTRIN SR) 150 MG 12 hr tablet Take 1 tablet (150 mg total) by mouth 2 (two) times daily. Start with 150 mg po in  am for 3 days then increased to BID as above.   Cyanocobalamin (B-12 COMPLIANCE INJECTION) 1000 MCG/ML KIT Inject 1,000 mcg as directed every 14 (fourteen) days.   dexmethylphenidate (FOCALIN XR) 10 MG 24 hr capsule Take 1 capsule (10 mg total) by mouth daily. Take 1 capsule by mouth daily, for the first week. Take 2 capsules by mouth daily, beginning the second week.     Allergies:   Clindamycin/lincomycin   Social History   Socioeconomic History   Marital status: Single    Spouse name: Not on file   Number of children: Not on file   Years of education: Not on file   Highest education level: Not on file  Occupational History   Not on file  Tobacco Use   Smoking status: Never    Passive exposure: Yes   Smokeless tobacco: Never  Vaping Use   Vaping Use: Never used  Substance and Sexual Activity   Alcohol use: Yes    Comment: socially beer   Drug use: No   Sexual activity: Yes  Other Topics Concern   Not on file  Social History Narrative   Not on file  Social Determinants of Health   Financial Resource Strain: Not on file  Food Insecurity: Not on file  Transportation Needs: Not on file  Physical Activity: Not on file  Stress: Not on file  Social Connections: Not on file     Family History: The patient's family history includes Arthritis in her mother; Behavior problems in her sister; Cancer in her paternal grandfather; Diabetes in her maternal grandfather; Intestinal polyp in her father; Kidney disease in her father and paternal grandmother; Other in her mother.  ROS:   Please see the history of present illness.     All other systems reviewed and are negative.  EKGs/Labs/Other Studies Reviewed:    The following studies were reviewed today:   EKG:  EKG is  ordered today.  The ekg ordered today demonstrates  normal sinus rhythm, normal ECG.  Recent Labs: 12/31/2020: Hemoglobin 13.5; Platelets 311 03/20/2021: ALT 16; BUN 12; Creatinine, Ser 0.66; Potassium 4.2; Sodium 139; TSH 1.670  Recent Lipid Panel    Component Value Date/Time   CHOL 163 03/20/2021 0917   TRIG 90 03/20/2021 0917   HDL 71 03/20/2021 0917   CHOLHDL 2.3 03/20/2021 0917   LDLCALC 76 03/20/2021 0917     Risk Assessment/Calculations:          Physical Exam:    VS:  BP 134/72 (BP Location: Left Arm, Patient Position: Sitting, Cuff Size: Normal)   Pulse 60   Ht 5' 5"  (1.651 m)   Wt 201 lb (91.2 kg)   LMP 03/10/2021 Comment: 3 days  SpO2 99%   BMI 33.45 kg/m     Wt Readings from Last 3 Encounters:  03/25/21 201 lb (91.2 kg)  03/20/21 204 lb 14.4 oz (92.9 kg)  01/01/21 191 lb 11.2 oz (87 kg)     GEN:  Well nourished, well developed in no acute distress HEENT: Normal NECK: No JVD; No carotid bruits LYMPHATICS: No lymphadenopathy CARDIAC: RRR, no murmurs, rubs, gallops RESPIRATORY:  Clear to auscultation without rales, wheezing or rhonchi  ABDOMEN: Soft, non-tender, non-distended MUSCULOSKELETAL:  No edema; No deformity  SKIN: Warm and dry NEUROLOGIC:  Alert and oriented x 3 PSYCHIATRIC:  Normal affect   ASSESSMENT:    1. Hx of sinus bradycardia   2. Fatigue, unspecified type    PLAN:    In order of problems listed above:  Sinus bradycardia, EKG from PCP reviewed, shows sinus bradycardia heart rate 51 otherwise normal.  EKG today shows normal sinus rhythm, normal ECG.  No indication for additional testing, or pacemaker.  Her symptoms of fatigue is not due to bradycardia. Nonspecific fatigue, current weight gain.  Patient encouraged to exercise as this will help with both physical health and also mental.  If she is not able to lose weight, recommend following up with PCP regarding nutrition consult for additional guidance on diet.  Follow-up as needed     Medication Adjustments/Labs and Tests  Ordered: Current medicines are reviewed at length with the patient today.  Concerns regarding medicines are outlined above.  Orders Placed This Encounter  Procedures   EKG 12-Lead   No orders of the defined types were placed in this encounter.   Patient Instructions  Medication Instructions:  Your physician recommends that you continue on your current medications as directed. Please refer to the Current Medication list given to you today.  *If you need a refill on your cardiac medications before your next appointment, please call your pharmacy*   Lab Work: None ordered If you  have labs (blood work) drawn today and your tests are completely normal, you will receive your results only by: [place] (if you have MyChart) OR A paper copy in the mail If you have any lab test that is abnormal or we need to change your treatment, we will call you to review the results.   Testing/Procedures: None ordered   Follow-Up: At [facility], you and your health needs are our priority.  As part of our continuing mission to provide you with exceptional heart care, we have created designated Provider Care Teams.  These Care Teams include your primary Cardiologist (physician) and Advanced Practice Providers (APPs -  Physician Assistants and Nurse Practitioners) who all work together to provide you with the care you need, when you need it.  We recommend signing up for the patient portal called "MyChart".  Sign up information is provided on this After Visit Summary.  MyChart is used to connect with patients for Virtual Visits (Telemedicine).  Patients are able to view lab/test results, encounter notes, upcoming appointments, etc.  Non-urgent messages can be sent to your provider as well.   To learn more about what you can do with MyChart, go to [email or web address].    Your next appointment:   Follow up as needed   The format for your next appointment:   In Person  Provider:   Kate Sable, MD   Other Instructions    Signed, Kate Sable, MD  03/25/2021 11:37 AM    [place]

## 2021-04-01 ENCOUNTER — Telehealth: Payer: Self-pay

## 2021-04-01 NOTE — Telephone Encounter (Signed)
Copied from CRM [id number]. Topic: General - Other >> Apr 01, 2021  9:46 AM Payton Spark N wrote: Reason for CRM: Pt called in stating her medication dexmethylphenidate (FOCALIN XR) 10 MG 24 hr capsule when it was sent to the pharmacy they stated due to her pharmacy they only gave her 10 pills, and pt states she needs it fixed so she can get all of her medication, please advise

## 2021-04-01 NOTE — Telephone Encounter (Signed)
Called and spoke with patient on the phone who states that pharmacy only gave her enough to last  7 days, patient states that she was told because you increased patient to two capsules by mouth daily beginning second week that insurance will not pay. Patient states that insurance company should have reached out to you last week to advise you of this and to see if you wanted to send a new prescription for patient to take Focalin XR 20mg  qd instead so it is covered by insurance? Please review and advise. KW

## 2021-04-04 NOTE — Telephone Encounter (Signed)
Copied from CRM [id number]. Topic: General - Other >> Apr 04, 2021  4:20 PM Marylen Ponto wrote: Reason for CRM: Pt stated the issue with the Rx for dexmethylphenidate (FOCALIN XR) 10 MG 24 hr capsule still has not been resolved. Pt request call back as she stated the pharmacy is not answering and she does not know what is going on. Cb# [phone]

## 2021-04-05 ENCOUNTER — Other Ambulatory Visit: Payer: Self-pay | Admitting: Family Medicine

## 2021-04-05 DIAGNOSIS — R41840 Attention and concentration deficit: Secondary | ICD-10-CM

## 2021-04-05 MED ORDER — DEXMETHYLPHENIDATE HCL ER 10 MG PO CP24: 20.0000 mg | ORAL_CAPSULE | Freq: Every day | ORAL | 0 refills | Status: DC

## 2021-04-05 NOTE — Telephone Encounter (Signed)
See message . KW 

## 2021-04-05 NOTE — Telephone Encounter (Signed)
On 03/21/21 medication was suppose to be revised to reflect request from pharmacy for Focalin XR since insurance would not cover at sig originally ordered. Can you change prescription and send to pharmacy? KW

## 2021-04-08 ENCOUNTER — Other Ambulatory Visit: Payer: Self-pay | Admitting: Family Medicine

## 2021-04-08 DIAGNOSIS — R41840 Attention and concentration deficit: Secondary | ICD-10-CM

## 2021-04-10 ENCOUNTER — Other Ambulatory Visit: Payer: Self-pay | Admitting: Family Medicine

## 2021-04-10 DIAGNOSIS — R41840 Attention and concentration deficit: Secondary | ICD-10-CM

## 2021-04-10 MED ORDER — DEXMETHYLPHENIDATE HCL ER 20 MG PO CP24: 20.0000 mg | ORAL_CAPSULE | Freq: Every day | ORAL | 0 refills | Status: DC

## 2021-04-11 ENCOUNTER — Other Ambulatory Visit: Payer: Self-pay | Admitting: Family Medicine

## 2021-04-11 DIAGNOSIS — R41840 Attention and concentration deficit: Secondary | ICD-10-CM

## 2021-04-11 MED ORDER — DEXMETHYLPHENIDATE HCL ER 20 MG PO CP24: 20.0000 mg | ORAL_CAPSULE | Freq: Every day | ORAL | 0 refills | Status: DC

## 2021-04-11 NOTE — Telephone Encounter (Signed)
Requested medication (s) are due for refill today:   No  Looks like it was done on 04/10/2021  Requested medication (s) are on the active medication list:   Yes  Future visit scheduled:   No   Last ordered: 04/10/2021 #90, 0 refills  Returned because it's a non delegated refill per the protocol.   Requested Prescriptions  Pending Prescriptions Disp Refills   FOCALIN XR 20 MG 24 hr capsule [Pharmacy Med Name: FOCALIN XR 20MG  CAP] 90 capsule 0    Sig: TAKE 1 CAPSULE BY MOUTH ONCE DAILY     Not Delegated - Psychiatry:  Stimulants/ADHD Failed - 04/10/2021  7:34 PM      Failed - This refill cannot be delegated      Failed - Urine Drug Screen completed in last 360 days      Passed - Valid encounter within last 3 months    Recent Outpatient Visits           3 weeks ago Attention and concentration deficit   [facility] T, FNP   1 year ago Depression, major, single episode, mild [facility])   [place] [facility] [place], [place], PA-C   1 year ago Breast lump on left side at 8 o'clock position   [facility], MD   1 year ago Galactorrhea of both breasts   [facility] [place], [facility], FNP   1 year ago Rash   [facility] M, M

## 2021-04-15 ENCOUNTER — Other Ambulatory Visit: Payer: Self-pay | Admitting: Family Medicine

## 2021-04-15 ENCOUNTER — Encounter: Payer: Self-pay | Admitting: Oncology

## 2021-04-15 DIAGNOSIS — F320 Major depressive disorder, single episode, mild: Secondary | ICD-10-CM

## 2021-04-15 NOTE — Telephone Encounter (Signed)
Requested medications are due for refill today yes  Requested medications are on the active medication list yes  Last refill 03/21/21  Last visit 03/20/21  Future visit scheduled NO, was asked to return in one month  Notes to clinic Does meet protocol however the OV note states to return in one month, no upcoming visit scheduled, please assess.

## 2021-04-26 ENCOUNTER — Other Ambulatory Visit: Payer: Self-pay

## 2021-04-26 ENCOUNTER — Inpatient Hospital Stay: Payer: Medicaid Other | Attending: Oncology

## 2021-04-26 DIAGNOSIS — N920 Excessive and frequent menstruation with regular cycle: Secondary | ICD-10-CM | POA: Diagnosis not present

## 2021-04-26 DIAGNOSIS — I422 Other hypertrophic cardiomyopathy: Secondary | ICD-10-CM | POA: Diagnosis not present

## 2021-04-26 DIAGNOSIS — Z79899 Other long term (current) drug therapy: Secondary | ICD-10-CM | POA: Diagnosis not present

## 2021-04-26 DIAGNOSIS — D509 Iron deficiency anemia, unspecified: Secondary | ICD-10-CM | POA: Insufficient documentation

## 2021-04-26 DIAGNOSIS — R001 Bradycardia, unspecified: Secondary | ICD-10-CM | POA: Insufficient documentation

## 2021-04-26 DIAGNOSIS — E538 Deficiency of other specified B group vitamins: Secondary | ICD-10-CM | POA: Diagnosis not present

## 2021-04-26 LAB — CBC WITH DIFFERENTIAL/PLATELET
Abs Immature Granulocytes: 0.02 10*3/uL (ref 0.00–0.07)
Basophils Absolute: 0 10*3/uL (ref 0.0–0.1)
Basophils Relative: 0 %
Eosinophils Absolute: 0.1 10*3/uL (ref 0.0–0.5)
Eosinophils Relative: 1 %
HCT: 38 % (ref 36.0–46.0)
Hemoglobin: 12.6 g/dL (ref 12.0–15.0)
Immature Granulocytes: 0 %
Lymphocytes Relative: 26 %
Lymphs Abs: 2.2 10*3/uL (ref 0.7–4.0)
MCH: 31.1 pg (ref 26.0–34.0)
MCHC: 33.2 g/dL (ref 30.0–36.0)
MCV: 93.8 fL (ref 80.0–100.0)
Monocytes Absolute: 0.4 10*3/uL (ref 0.1–1.0)
Monocytes Relative: 5 %
Neutro Abs: 5.4 10*3/uL (ref 1.7–7.7)
Neutrophils Relative %: 68 %
Platelets: 287 10*3/uL (ref 150–400)
RBC: 4.05 MIL/uL (ref 3.87–5.11)
RDW: 12.8 % (ref 11.5–15.5)
WBC: 8.1 10*3/uL (ref 4.0–10.5)
nRBC: 0 % (ref 0.0–0.2)

## 2021-04-26 LAB — IRON AND TIBC
Iron: 72 ug/dL (ref 28–170)
Saturation Ratios: 19 % (ref 10.4–31.8)
TIBC: 381 ug/dL (ref 250–450)
UIBC: 309 ug/dL

## 2021-04-26 LAB — FERRITIN: Ferritin: 9 ng/mL — ABNORMAL LOW (ref 11–307)

## 2021-04-29 ENCOUNTER — Inpatient Hospital Stay: Payer: Medicaid Other

## 2021-04-29 ENCOUNTER — Other Ambulatory Visit: Payer: Self-pay

## 2021-04-29 ENCOUNTER — Inpatient Hospital Stay (HOSPITAL_BASED_OUTPATIENT_CLINIC_OR_DEPARTMENT_OTHER): Payer: Medicaid Other | Admitting: Oncology

## 2021-04-29 VITALS — BP 121/77 | HR 71 | Temp 99.0°F | Resp 18

## 2021-04-29 DIAGNOSIS — D509 Iron deficiency anemia, unspecified: Secondary | ICD-10-CM

## 2021-04-29 MED ORDER — B-12 COMPLIANCE INJECTION 1000 MCG/ML IJ KIT: 1000.0000 ug | PACK | INTRAMUSCULAR | 3 refills | Status: DC

## 2021-04-29 MED ORDER — IRON SUCROSE 20 MG/ML IV SOLN
200.0000 mg | Freq: Once | INTRAVENOUS | Status: DC
  Filled 2021-04-29: qty 10

## 2021-04-29 MED ORDER — IRON SUCROSE 20 MG/ML IV SOLN
200.0000 mg | Freq: Once | INTRAVENOUS | Status: AC
  Administered 2021-04-29: 200 mg via INTRAVENOUS

## 2021-04-29 MED ORDER — SODIUM CHLORIDE 0.9 % IV SOLN: 200.0000 mg | Freq: Once | INTRAVENOUS | Status: DC

## 2021-04-29 MED ORDER — SODIUM CHLORIDE 0.9 % IV SOLN
Freq: Once | INTRAVENOUS | Status: AC
  Filled 2021-04-29: qty 250

## 2021-04-29 NOTE — Progress Notes (Signed)
[place]  [phone] [phone]  ID: Rebecca Lang OB: 16-Aug-1975  MR#: [id number]  ITG#:[id number]  Patient Care Team: Rebecca Sprout, FNP as PCP - General (Family Medicine) Rebecca Sable, MD as PCP - Cardiology (Cardiology) Rebecca Huger, MD as Consulting Physician (Hematology and Oncology)  CHIEF COMPLAINT: Iron deficiency and B12 deficiency anemia.  INTERVAL HISTORY: Rebecca Lang is a 45 year old female with past medical history significant for menorrhagia, obesity, depression and iron deficiency anemia who is followed by Rebecca Lang and receives intermittent IV Venofer.  She is followed by cardiology for bradycardia.  Heart rate baseline in the 50s.  She was recently started on Focalin XR for concentration by PCP.  Today, patient reports feeling extremely tired.  States she is extremely busy but feels like she could sleep all day.  Reports heavy and more frequent menstrual cycles.  Cycles only last 3 to 4 days and but are heavy.  States she thinks she may need a hysterectomy in the future.  REVIEW OF SYSTEMS:   Review of Systems  Constitutional:  Positive for malaise/fatigue. Negative for chills, fever and weight loss.  HENT:  Negative for congestion, ear pain and tinnitus.   Eyes: Negative.  Negative for blurred vision and double vision.  Respiratory: Negative.  Negative for cough, sputum production and shortness of breath.   Cardiovascular: Negative.  Negative for chest pain, palpitations and leg swelling.  Gastrointestinal: Negative.  Negative for abdominal pain, constipation, diarrhea, nausea and vomiting.  Genitourinary:  Negative for dysuria, frequency and urgency.  Musculoskeletal:  Negative for back pain and falls.  Skin: Negative.  Negative for rash.  Neurological:  Positive for weakness. Negative for headaches.  Endo/Heme/Allergies: Negative.  Does not bruise/bleed easily.  Psychiatric/Behavioral: Negative.   Negative for depression. The patient is not nervous/anxious and does not have insomnia.    As per HPI. Otherwise, a complete review of systems is negative.  PAST MEDICAL HISTORY: Past Medical History:  Diagnosis Date   Anemia    Anxiety    Back pain    Depression    Hypertrophic cardiomyopathy ([place])     PAST SURGICAL HISTORY: Past Surgical History:  Procedure Laterality Date   ABDOMINAL SURGERY  2004   gastric bypass   CESAREAN SECTION     DIAGNOSTIC LAPAROSCOPY WITH REMOVAL OF ECTOPIC PREGNANCY Right 12/03/2018   Procedure: DIAGNOSTIC LAPAROSCOPY WITH REMOVAL OF RIGHT ECTOPIC PREGNANCY AND RIGHT OVARY;  Surgeon: Benjaman Kindler, MD;  Location: [facility] ORS;  Service: Gynecology;  Laterality: Right;   INNER EAR SURGERY     LAPAROSCOPIC GASTRIC BYPASS  2004   tubal rupture      FAMILY HISTORY: Family History  Problem Relation Age of Onset   Arthritis Mother    Other Mother        back pain w stimulator ( internal )   Kidney disease Father    Intestinal polyp Father    Behavior problems Sister    Diabetes Maternal Grandfather    Kidney disease Paternal Grandmother    Cancer Paternal Grandfather     ADVANCED DIRECTIVES (Y/N):  N  HEALTH MAINTENANCE: Social History   Tobacco Use   Smoking status: Never    Passive exposure: Yes   Smokeless tobacco: Never  Vaping Use   Vaping Use: Never used  Substance Use Topics   Alcohol use: Yes    Comment: socially beer   Drug use: No     Colonoscopy:  PAP:  Bone density:  Lipid panel:  Allergies  Allergen Reactions   Clindamycin/Lincomycin     Rash    Current Outpatient Medications  Medication Sig Dispense Refill   blood glucose meter kit and supplies Check blood glucose fasting in am Dispense based on patient and insurance preference. Use up to four times daily as directed. (FOR ICD-10 E10.9, E11.9). 1 each 0   buPROPion (WELLBUTRIN SR) 150 MG 12 hr tablet TAKE 1 TABLET BY MOUTH TWICE DAILY (START  WITH  ONE  TABLET   IN  THE  MORNING  FOR  3  DAYS  THEN  INCREASE  TO  TWICE  DAILY  AS  ABOVE) 60 tablet 0   Cyanocobalamin (B-12 COMPLIANCE INJECTION) 1000 MCG/ML KIT Inject 1,000 mcg as directed every 14 (fourteen) days. 4 kit 3   dexmethylphenidate (FOCALIN XR) 20 MG 24 hr capsule Take 1 capsule (20 mg total) by mouth daily. 90 capsule 0   dexmethylphenidate (FOCALIN XR) 20 MG 24 hr capsule Take 1 capsule (20 mg total) by mouth daily. 30 capsule 0   [START ON 05/17/2021] dexmethylphenidate (FOCALIN XR) 20 MG 24 hr capsule Take 1 capsule (20 mg total) by mouth daily. 30 capsule 0   No current facility-administered medications for this visit.    OBJECTIVE: There were no vitals filed for this visit.    There is no height or weight on file to calculate BMI.    ECOG FS:0 - Asymptomatic   Physical Exam Constitutional:      Appearance: Normal appearance. She is obese.  HENT:     Head: Normocephalic and atraumatic.  Eyes:     Pupils: Pupils are equal, round, and reactive to light.  Cardiovascular:     Rate and Rhythm: Normal rate and regular rhythm.     Heart sounds: Normal heart sounds. No murmur heard. Pulmonary:     Effort: Pulmonary effort is normal.     Breath sounds: Normal breath sounds. No wheezing.  Abdominal:     General: Bowel sounds are normal. There is no distension.     Palpations: Abdomen is soft.     Tenderness: There is no abdominal tenderness.  Musculoskeletal:        General: Normal range of motion.     Cervical back: Normal range of motion.  Skin:    General: Skin is warm and dry.     Findings: No rash.  Neurological:     Mental Status: She is alert and oriented to person, place, and time.  Psychiatric:        Judgment: Judgment normal.     LAB RESULTS:  Lab Results  Component Value Date   NA 139 03/20/2021   K 4.2 03/20/2021   CL 104 03/20/2021   CO2 23 03/20/2021   GLUCOSE 80 03/20/2021   BUN 12 03/20/2021   CREATININE 0.66 03/20/2021   CALCIUM 9.2 03/20/2021    PROT 5.9 (L) 03/20/2021   ALBUMIN 3.6 (L) 03/20/2021   AST 18 03/20/2021   ALT 16 03/20/2021   ALKPHOS 72 03/20/2021   BILITOT 0.5 03/20/2021   GFRNONAA 113 12/16/2019   GFRAA 130 12/16/2019    Lab Results  Component Value Date   WBC 8.1 04/26/2021   NEUTROABS 5.4 04/26/2021   HGB 12.6 04/26/2021   HCT 38.0 04/26/2021   MCV 93.8 04/26/2021   PLT 287 04/26/2021   Lab Results  Component Value Date   IRON 72 04/26/2021   TIBC 381 04/26/2021   IRONPCTSAT 19 04/26/2021  Lab Results  Component Value Date   FERRITIN 9 (L) 04/26/2021     STUDIES: No results found.  ASSESSMENT: Iron deficiency and B12 deficiency anemia  PLAN:   1. Iron deficiency anemia:  Etiology felt to be secondary to poor absorption from gastric bypass and heavy menstrual cycles.  Labs from 04/26/2021 show a ferritin of 9 with an iron saturation of 19%.  Hemoglobin is normal at 12.6.  On chart review, her hemoglobin has been stable for quite some time.  Her last IV Venofer was on 08/31/2020 and 09/05/2020.  Recommend 3 doses of IV Venofer.  Return to clinic in 4 months with repeat labs, see Rebecca Lang and possible IV iron.  2.  B12 deficiency:  B12 level from 03/20/2021 was normal at 387.  She has been giving these to herself at home every other week.  We will refill her prescription and she can continue for the next 4 months.  We will recheck B12 levels at next visit.  I spent 25 minutes dedicated to the care of this patient (face-to-face and non-face-to-face) on the date of the encounter to include what is described in the assessment and plan.  Patient expressed understanding and was in agreement with this plan. She also understands that She can call clinic at any time with any questions, concerns, or complaints.    Jacquelin Hawking, NP   04/29/2021 12:45 PM

## 2021-05-02 ENCOUNTER — Inpatient Hospital Stay: Payer: Medicaid Other

## 2021-05-02 ENCOUNTER — Other Ambulatory Visit: Payer: Self-pay

## 2021-05-02 VITALS — BP 120/76 | HR 70 | Temp 98.6°F | Resp 20

## 2021-05-02 DIAGNOSIS — D509 Iron deficiency anemia, unspecified: Secondary | ICD-10-CM | POA: Diagnosis not present

## 2021-05-02 MED ORDER — SODIUM CHLORIDE 0.9 % IV SOLN: 200.0000 mg | Freq: Once | INTRAVENOUS | Status: DC

## 2021-05-02 MED ORDER — SODIUM CHLORIDE 0.9 % IV SOLN
Freq: Once | INTRAVENOUS | Status: AC
  Filled 2021-05-02: qty 250

## 2021-05-02 MED ORDER — IRON SUCROSE 20 MG/ML IV SOLN
200.0000 mg | Freq: Once | INTRAVENOUS | Status: AC
  Administered 2021-05-02: 200 mg via INTRAVENOUS
  Filled 2021-05-02: qty 10

## 2021-05-02 NOTE — Patient Instructions (Signed)

## 2021-05-06 ENCOUNTER — Inpatient Hospital Stay: Payer: Medicaid Other

## 2021-05-06 ENCOUNTER — Other Ambulatory Visit: Payer: Self-pay

## 2021-05-06 VITALS — BP 99/52 | HR 69 | Temp 99.3°F | Resp 20

## 2021-05-06 DIAGNOSIS — D509 Iron deficiency anemia, unspecified: Secondary | ICD-10-CM | POA: Diagnosis not present

## 2021-05-06 MED ORDER — IRON SUCROSE 20 MG/ML IV SOLN
200.0000 mg | Freq: Once | INTRAVENOUS | Status: AC
  Administered 2021-05-06: 200 mg via INTRAVENOUS
  Filled 2021-05-06: qty 10

## 2021-05-06 MED ORDER — SODIUM CHLORIDE 0.9 % IV SOLN
Freq: Once | INTRAVENOUS | Status: AC
  Filled 2021-05-06: qty 250

## 2021-05-06 MED ORDER — SODIUM CHLORIDE 0.9 % IV SOLN: 200.0000 mg | Freq: Once | INTRAVENOUS | Status: DC

## 2021-05-13 ENCOUNTER — Other Ambulatory Visit: Payer: Self-pay

## 2021-05-13 ENCOUNTER — Ambulatory Visit
Admission: EM | Admit: 2021-05-13 | Discharge: 2021-05-13 | Disposition: A | Discharge status: Home / Self Care | Payer: Medicaid Other | Attending: Emergency Medicine | Admitting: Emergency Medicine

## 2021-05-13 ENCOUNTER — Ambulatory Visit: Payer: Self-pay | Admitting: *Deleted

## 2021-05-13 DIAGNOSIS — R21 Rash and other nonspecific skin eruption: Secondary | ICD-10-CM | POA: Diagnosis not present

## 2021-05-13 MED ORDER — NYSTATIN 100000 UNIT/GM EX CREA: TOPICAL_CREAM | CUTANEOUS | 0 refills | Status: DC

## 2021-05-13 MED ORDER — PREDNISONE 10 MG (21) PO TBPK: ORAL_TABLET | Freq: Every day | ORAL | 0 refills | Status: DC

## 2021-05-13 NOTE — Telephone Encounter (Signed)
Pt. Reports she noticed a rash 05/09/21. At first thought it looked like poison ivy. Rash is on trunk. Some areas are red and raised, some area are flat. Itchy. No availability in the practice per Okey Regal. Pt. Will go to UC.     Reason for Disposition  SEVERE itching (i.e., interferes with sleep, normal activities or school)  Answer Assessment - Initial Assessment Questions 1. APPEARANCE of RASH: "Describe the rash." (e.g., spots, blisters, raised areas, skin peeling, scaly)     Looks like poison ivy 2. SIZE: "How big are the spots?" (e.g., tip of pen, eraser, coin; inches, centimeters)     Raised, red 3. LOCATION: "Where is the rash located?"     Shoulder, stomach, back, chest 4. COLOR: "What color is the rash?" (Note: It is difficult to assess rash color in people with darker-colored skin. When this situation occurs, simply ask the caller to describe what they see.)     Red 5. ONSET: "When did the rash begin?"     05/09/21 6. FEVER: "Do you have a fever?" If Yes, ask: "What is your temperature, how was it measured, and when did it start?"     No 7. ITCHING: "Does the rash itch?" If Yes, ask: "How bad is the itch?" (Scale 1-10; or mild, moderate, severe)     Moderate 8. CAUSE: "What do you think is causing the rash?"     Unsure 9. MEDICINE FACTORS: "Have you started any new medicines within the last 2 weeks?" (e.g., antibiotics)      Focalin 10. OTHER SYMPTOMS: "Do you have any other symptoms?" (e.g., dizziness, headache, sore throat, joint pain)       No 11. PREGNANCY: "Is there any chance you are pregnant?" "When was your last menstrual period?"       No  Protocols used: Rash or Redness - [facility]

## 2021-05-13 NOTE — ED Provider Notes (Signed)
Rebecca Lang    CSN: [id number] Arrival date & time: 05/13/21  1411      History   Chief Complaint Chief Complaint  Patient presents with   Rash    HPI Rebecca Lang is a 45 y.o. female.  Patient presents with pruritic rash on her abdomen and around her breasts.  Treatment attempted at home with topical steroid cream and Benadryl.  She denies fever, chills, sore throat, cough, shortness of breath, or other symptoms.  Her medical history includes cardiomyopathy, iron deficiency anemia, gastric bypass, depression, anxiety.  The history is provided by the patient and medical records.   Past Medical History:  Diagnosis Date   Anemia    Anxiety    Back pain    Depression    Hypertrophic cardiomyopathy ([place])     Patient Active Problem List   Diagnosis Date Noted   Bradycardia 03/20/2021   Attention and concentration deficit 03/20/2021   Fatigue 03/20/2021   BMI 34.0-34.9,adult 03/20/2021   Depression, major, single episode, in partial remission ([place]) 03/20/2021   Weight gain 03/20/2021   Galactorrhea of both breasts 01/18/2020   Light headedness 01/18/2020   Vitamin D insufficiency 01/18/2020   Alcohol use 01/18/2020   Hypoglycemia 01/18/2020   Menorrhagia with regular cycle 12/16/2019   Fracture of foot 12/16/2019   BMI 33.0-33.9,adult 12/16/2019   Blood sugar increased 12/16/2019   Encounter for screening mammogram for malignant neoplasm of breast 12/16/2019   History of Roux-en-Y gastric bypass- around age 78 12/16/2019   Adverse food reaction 12/16/2019   Depression, major, single episode, mild ([place]) 12/16/2019   History of ectopic pregnancy 12/16/2019   Ectopic pregnancy, tubal 12/03/2018   Iron deficiency anemia 03/02/2018    Past Surgical History:  Procedure Laterality Date   ABDOMINAL SURGERY  2004   gastric bypass   CESAREAN SECTION     DIAGNOSTIC LAPAROSCOPY WITH REMOVAL OF ECTOPIC PREGNANCY Right 12/03/2018   Procedure: DIAGNOSTIC  LAPAROSCOPY WITH REMOVAL OF RIGHT ECTOPIC PREGNANCY AND RIGHT OVARY;  Surgeon: Benjaman Kindler, MD;  Location: [facility] ORS;  Service: Gynecology;  Laterality: Right;   INNER EAR SURGERY     LAPAROSCOPIC GASTRIC BYPASS  2004   tubal rupture      OB History     Gravida  2   Para      Term      Preterm      AB      Living         SAB      IAB      Ectopic      Multiple      Live Births               Home Medications    Prior to Admission medications   Medication Sig Start Date End Date Taking? Authorizing Provider  nystatin cream (MYCOSTATIN) Apply to affected area 2 times daily 05/13/21  Yes Sharion Balloon, NP  predniSONE (STERAPRED UNI-PAK 21 TAB) 10 MG (21) TBPK tablet Take by mouth daily. As directed 05/13/21  Yes Sharion Balloon, NP  blood glucose meter kit and supplies Check blood glucose fasting in am Dispense based on patient and insurance preference. Use up to four times daily as directed. (FOR ICD-10 E10.9, E11.9). 01/18/20   Flinchum, Kelby Aline, FNP  buPROPion (WELLBUTRIN SR) 150 MG 12 hr tablet TAKE 1 TABLET BY MOUTH TWICE DAILY (START  WITH  ONE  TABLET  IN  THE  MORNING  FOR  3  DAYS  THEN  INCREASE  TO  TWICE  DAILY  AS  ABOVE) 04/16/21   Gwyneth Sprout, FNP  Cyanocobalamin (B-12 COMPLIANCE INJECTION) 1000 MCG/ML KIT Inject 1,000 mcg as directed every 14 (fourteen) days. 04/29/21   Jacquelin Hawking, NP  dexmethylphenidate (FOCALIN XR) 20 MG 24 hr capsule Take 1 capsule (20 mg total) by mouth daily. 04/10/21   Gwyneth Sprout, FNP  dexmethylphenidate (FOCALIN XR) 20 MG 24 hr capsule Take 1 capsule (20 mg total) by mouth daily. 04/13/21   Gwyneth Sprout, FNP  dexmethylphenidate (FOCALIN XR) 20 MG 24 hr capsule Take 1 capsule (20 mg total) by mouth daily. 05/17/21   Gwyneth Sprout, FNP    Family History Family History  Problem Relation Age of Onset   Arthritis Mother    Other Mother        back pain w stimulator ( internal )   Kidney disease Father     Intestinal polyp Father    Behavior problems Sister    Diabetes Maternal Grandfather    Kidney disease Paternal Grandmother    Cancer Paternal Grandfather     Social History Social History   Tobacco Use   Smoking status: Never    Passive exposure: Yes   Smokeless tobacco: Never  Vaping Use   Vaping Use: Never used  Substance Use Topics   Alcohol use: Yes    Comment: socially beer   Drug use: No     Allergies   Clindamycin/lincomycin   Review of Systems Review of Systems  Constitutional:  Negative for chills and fever.  HENT:  Negative for ear pain and sore throat.   Respiratory:  Negative for cough and shortness of breath.   Cardiovascular:  Negative for chest pain and palpitations.  Skin:  Positive for rash.  All other systems reviewed and are negative.   Physical Exam Triage Vital Signs ED Triage Vitals  Enc Vitals Group     BP 05/13/21 1531 133/84     Pulse Rate 05/13/21 1531 64     Resp 05/13/21 1531 18     Temp 05/13/21 1531 98.7 F (37.1 C)     Temp Source 05/13/21 1531 Oral     SpO2 05/13/21 1531 98 %     Weight --      Height --      Head Circumference --      Peak Flow --      Pain Score 05/13/21 1440 0     Pain Loc --      Pain Edu? --      Excl. in [place]? --    No data found.  Updated Vital Signs BP 133/84 (BP Location: Left Arm)   Pulse 64   Temp 98.7 F (37.1 C) (Oral)   Resp 18   LMP 04/22/2021   SpO2 98%   Visual Acuity Right Eye Distance:   Left Eye Distance:   Bilateral Distance:    Right Eye Near:   Left Eye Near:    Bilateral Near:     Physical Exam Vitals and nursing note reviewed.  Constitutional:      General: She is not in acute distress.    Appearance: She is well-developed. She is not ill-appearing.  HENT:     Head: Normocephalic and atraumatic.     Mouth/Throat:     Mouth: Mucous membranes are moist.  Eyes:     Conjunctiva/sclera: Conjunctivae normal.  Cardiovascular:  Rate and Rhythm: Normal rate and  regular rhythm.     Heart sounds: Normal heart sounds.  Pulmonary:     Effort: Pulmonary effort is normal. No respiratory distress.     Breath sounds: Normal breath sounds.  Abdominal:     Palpations: Abdomen is soft.     Tenderness: There is no abdominal tenderness.  Musculoskeletal:     Cervical back: Neck supple.  Skin:    General: Skin is warm and dry.     Findings: Rash present.     Comments: Erythematous patchy rash under breasts and on abdomen and skin folds.  Few scattered erythematous papules on right shoulder and left flank.  No drainage.  Neurological:     General: No focal deficit present.     Mental Status: She is alert and oriented to person, place, and time.  Psychiatric:        Mood and Affect: Mood normal.        Behavior: Behavior normal.     UC Treatments / Results  Labs (all labs ordered are listed, but only abnormal results are displayed) Labs Reviewed - No data to display  EKG   Radiology No results found.  Procedures Procedures (including critical care time)  Medications Ordered in UC Medications - No data to display  Initial Impression / Assessment and Plan / UC Course  I have reviewed the triage vital signs and the nursing notes.  Pertinent labs & imaging results that were available during my care of the patient were reviewed by me and considered in my medical decision making (see chart for details).   Rash.  The rash is primarily located in skin folds.  But some scattered papules elsewhere.  It is pruritic.  Treating with prednisone taper, Benadryl, nystatin cream.  Instructed patient to follow-up with her PCP or dermatologist if her symptoms are not improving.  She agrees to plan of care.   Final Clinical Impressions(s) / UC Diagnoses   Final diagnoses:  Rash     Discharge Instructions      Take the prednisone as directed.  Continue taking Benadryl as directed.  Use the nystatin cream as directed.    Follow up with your primary  care provider or a dermatologist if your symptoms are not improving.        ED Prescriptions     Medication Sig Dispense Auth. Provider   predniSONE (STERAPRED UNI-PAK 21 TAB) 10 MG (21) TBPK tablet Take by mouth daily. As directed 21 tablet Sharion Balloon, NP   nystatin cream (MYCOSTATIN) Apply to affected area 2 times daily 30 g Sharion Balloon, NP      PDMP not reviewed this encounter.   Sharion Balloon, NP 05/13/21 1550

## 2021-05-13 NOTE — ED Triage Notes (Signed)
Patient presents to Urgent Care with complaints of a generalized body rash noted weds. She states the rash on breast area is warm to touch. Rash has not improved. Applying steroid cream with no relief.   Denies fever.

## 2021-05-13 NOTE — Telephone Encounter (Signed)
Summary: rash concerns   The patient has experienced a rash on their torso, shoulders, chest and back  since Thursday 05/09/21   The patient shares that in some areas the rash is raised and warm in some areas   The patient has been putting steroid cream that has little to no effect

## 2021-05-13 NOTE — Discharge Instructions (Addendum)
Take the prednisone as directed.  Continue taking Benadryl as directed.  Use the nystatin cream as directed.    Follow up with your primary care provider or a dermatologist if your symptoms are not improving.

## 2021-05-27 ENCOUNTER — Ambulatory Visit: Payer: Medicaid Other | Admitting: Family Medicine

## 2021-06-10 ENCOUNTER — Ambulatory Visit: Payer: Medicaid Other | Admitting: Family Medicine

## 2021-06-10 NOTE — Progress Notes (Deleted)
      Established patient visit   Patient: Rebecca Lang   DOB: 09-01-1975   45 y.o. Female  MRN: [id number] Visit Date: 06/10/2021  Today's healthcare provider: Gwyneth Sprout, FNP   No chief complaint on file.  Subjective    HPI  Follow up for Rash  The patient was last seen for this 05/13/2021 at [facility] ED . Changes made at last visit include prescribing prednisone and Nystatin.  She reports {excellent/good/fair/poor:19665} compliance with treatment. She feels that condition is {improved/worse/unchanged:3041574}. She {is/is not:21021397} having side effects. ***  -----------------------------------------------------------------------------------------   -----------------------------------------------------------------------------------------   {Link to patient history deactivated due to formatting error:1}  Medications: Outpatient Medications Prior to Visit  Medication Sig   blood glucose meter kit and supplies Check blood glucose fasting in am Dispense based on patient and insurance preference. Use up to four times daily as directed. (FOR ICD-10 E10.9, E11.9).   buPROPion (WELLBUTRIN SR) 150 MG 12 hr tablet TAKE 1 TABLET BY MOUTH TWICE DAILY (START  WITH  ONE  TABLET  IN  THE  MORNING  FOR  3  DAYS  THEN  INCREASE  TO  TWICE  DAILY  AS  ABOVE)   Cyanocobalamin (B-12 COMPLIANCE INJECTION) 1000 MCG/ML KIT Inject 1,000 mcg as directed every 14 (fourteen) days.   dexmethylphenidate (FOCALIN XR) 20 MG 24 hr capsule Take 1 capsule (20 mg total) by mouth daily.   dexmethylphenidate (FOCALIN XR) 20 MG 24 hr capsule Take 1 capsule (20 mg total) by mouth daily.   dexmethylphenidate (FOCALIN XR) 20 MG 24 hr capsule Take 1 capsule (20 mg total) by mouth daily.   nystatin cream (MYCOSTATIN) Apply to affected area 2 times daily   predniSONE (STERAPRED UNI-PAK 21 TAB) 10 MG (21) TBPK tablet Take by mouth daily. As directed   No facility-administered medications prior to visit.     Review of Systems  {Labs  Heme  Chem  Endocrine  Serology  Results Review (optional):23779}   Objective    There were no vitals taken for this visit. {Show previous vital signs (optional):23777}  Physical Exam  ***  No results found for any visits on 06/10/21.  Assessment & Plan     ***  No follow-ups on file.      {provider attestation***:1}   Gwyneth Sprout, [place] [phone] (phone) [phone] (fax)  [place]

## 2021-07-25 ENCOUNTER — Telehealth: Payer: Self-pay

## 2021-07-25 NOTE — Telephone Encounter (Signed)
Please review.  Did you order labwork?  Thanks,   -Vernona Rieger

## 2021-07-25 NOTE — Telephone Encounter (Signed)
Copied from CRM [id number]. Topic: General - Call Back - No Documentation >> Jul 25, 2021 11:28 AM Randol Kern wrote: Reason for CRM: Renee calling from [facility] called to report that the patient is missing diagnosis codes because her labs were denied by her insurance. All they have is R41.840, insurance did not deem medically necessary   Best contact: [phone] option 2 then 1  Specimen number: [id number]

## 2021-07-29 NOTE — Telephone Encounter (Signed)
I contacted [facility] and pulled up your last visit with patient DOS 03/20/21 R41.840 was a diagnosis code used for visit but not the only. When I spoke with rep she states that case was resolved and now closed no further action is needed. KW

## 2021-08-21 ENCOUNTER — Other Ambulatory Visit: Payer: Self-pay | Admitting: *Deleted

## 2021-08-21 DIAGNOSIS — D509 Iron deficiency anemia, unspecified: Secondary | ICD-10-CM

## 2021-08-26 NOTE — Progress Notes (Signed)
[place]  [phone] [phone]  ID: Rebecca Lang OB: [birth year]  MR#: [id number]  SNK#:[id number]  Patient Care Team: Gwyneth Sprout, FNP as PCP - General (Family Medicine) Kate Sable, MD as PCP - Cardiology (Cardiology) Lloyd Huger, MD as Consulting Physician (Hematology and Oncology)  CHIEF COMPLAINT: Iron deficiency and B12 deficiency anemia.  INTERVAL HISTORY: Patient returns to clinic today for repeat laboratory work, further evaluation, and consideration of additional IV Venofer.  She currently feels well and is asymptomatic.  She does not complain of any weakness or fatigue today. She has no neurologic complaints.  She denies any recent fevers or illnesses.  She has a good appetite and denies weight loss.  She denies any chest pain, shortness of breath, cough, or hemoptysis.  She denies any nausea, vomiting, constipation, or diarrhea.  She denies any melena or hematochezia.  She has no urinary complaints.  Patient offers no specific complaints today.  REVIEW OF SYSTEMS:   Review of Systems  Constitutional: Negative.  Negative for fever, malaise/fatigue and weight loss.  Respiratory: Negative.  Negative for cough and shortness of breath.   Cardiovascular: Negative.  Negative for chest pain and leg swelling.  Gastrointestinal: Negative.  Negative for abdominal pain, blood in stool and melena.  Genitourinary: Negative.  Negative for dysuria and hematuria.  Musculoskeletal: Negative.  Negative for back pain.  Skin: Negative.  Negative for rash.  Neurological: Negative.  Negative for focal weakness, weakness and headaches.  Psychiatric/Behavioral: Negative.  The patient is not nervous/anxious.    As per HPI. Otherwise, a complete review of systems is negative.  PAST MEDICAL HISTORY: Past Medical History:  Diagnosis Date   Anemia    Anxiety    Back pain    Depression    Hypertrophic cardiomyopathy ([place])     PAST  SURGICAL HISTORY: Past Surgical History:  Procedure Laterality Date   ABDOMINAL SURGERY  2004   gastric bypass   CESAREAN SECTION     DIAGNOSTIC LAPAROSCOPY WITH REMOVAL OF ECTOPIC PREGNANCY Right 12/03/2018   Procedure: DIAGNOSTIC LAPAROSCOPY WITH REMOVAL OF RIGHT ECTOPIC PREGNANCY AND RIGHT OVARY;  Surgeon: Benjaman Kindler, MD;  Location: [facility] ORS;  Service: Gynecology;  Laterality: Right;   INNER EAR SURGERY     LAPAROSCOPIC GASTRIC BYPASS  2004   tubal rupture      FAMILY HISTORY: Family History  Problem Relation Age of Onset   Arthritis Mother    Other Mother        back pain w stimulator ( internal )   Kidney disease Father    Intestinal polyp Father    Behavior problems Sister    Diabetes Maternal Grandfather    Kidney disease Paternal Grandmother    Cancer Paternal Grandfather     ADVANCED DIRECTIVES (Y/N):  N  HEALTH MAINTENANCE: Social History   Tobacco Use   Smoking status: Never    Passive exposure: Yes   Smokeless tobacco: Never  Vaping Use   Vaping Use: Never used  Substance Use Topics   Alcohol use: Yes    Comment: socially beer   Drug use: No     Colonoscopy:  PAP:  Bone density:  Lipid panel:  Allergies  Allergen Reactions   Clindamycin/Lincomycin     Rash    Current Outpatient Medications  Medication Sig Dispense Refill   buPROPion (WELLBUTRIN SR) 150 MG 12 hr tablet TAKE 1 TABLET BY MOUTH TWICE DAILY (START  WITH  ONE  TABLET  IN  THE  MORNING  FOR  3  DAYS  THEN  INCREASE  TO  TWICE  DAILY  AS  ABOVE) 60 tablet 0   Cyanocobalamin (B-12 COMPLIANCE INJECTION) 1000 MCG/ML KIT Inject 1,000 mcg as directed every 14 (fourteen) days. 4 kit 3   dexmethylphenidate (FOCALIN XR) 20 MG 24 hr capsule Take 1 capsule (20 mg total) by mouth daily. 90 capsule 0   blood glucose meter kit and supplies Check blood glucose fasting in am Dispense based on patient and insurance preference. Use up to four times daily as directed. (FOR ICD-10 E10.9, E11.9).  (Patient not taking: Reported on 08/29/2021) 1 each 0   nystatin cream (MYCOSTATIN) Apply to affected area 2 times daily (Patient not taking: Reported on 08/29/2021) 30 g 0   predniSONE (STERAPRED UNI-PAK 21 TAB) 10 MG (21) TBPK tablet Take by mouth daily. As directed (Patient not taking: Reported on 08/29/2021) 21 tablet 0   No current facility-administered medications for this visit.    OBJECTIVE: Vitals:   08/29/21 1450  BP: 140/90  Pulse: 68  Resp: 16  Temp: (!) 97.2 F (36.2 C)  SpO2: 99%     Body mass index is 33.17 kg/m.    ECOG FS:0 - Asymptomatic  General: Well-developed, well-nourished, no acute distress. Eyes: Pink conjunctiva, anicteric sclera. HEENT: Normocephalic, moist mucous membranes. Lungs: No audible wheezing or coughing. Heart: Regular rate and rhythm. Abdomen: Soft, nontender, no obvious distention. Musculoskeletal: No edema, cyanosis, or clubbing. Neuro: Alert, answering all questions appropriately. Cranial nerves grossly intact. Skin: No rashes or petechiae noted. Psych: Normal affect.  LAB RESULTS:  Lab Results  Component Value Date   NA 139 03/20/2021   K 4.2 03/20/2021   CL 104 03/20/2021   CO2 23 03/20/2021   GLUCOSE 80 03/20/2021   BUN 12 03/20/2021   CREATININE 0.66 03/20/2021   CALCIUM 9.2 03/20/2021   PROT 5.9 (L) 03/20/2021   ALBUMIN 3.6 (L) 03/20/2021   AST 18 03/20/2021   ALT 16 03/20/2021   ALKPHOS 72 03/20/2021   BILITOT 0.5 03/20/2021   GFRNONAA 113 12/16/2019   GFRAA 130 12/16/2019    Lab Results  Component Value Date   WBC 8.6 08/28/2021   NEUTROABS 5.7 08/28/2021   HGB 13.4 08/28/2021   HCT 41.0 08/28/2021   MCV 91.9 08/28/2021   PLT 338 08/28/2021   Lab Results  Component Value Date   IRON 64 08/28/2021   TIBC 342 08/28/2021   IRONPCTSAT 19 08/28/2021   Lab Results  Component Value Date   FERRITIN 36 08/28/2021     STUDIES: No results found.  ASSESSMENT: Iron deficiency and B12 deficiency anemia  PLAN:    1. Iron deficiency anemia: Likely secondary to poor absorption from patient's history of gastric bypass surgery.  Patient's hemoglobin and iron stores continue to be within normal limits.  Previously, the remainder of her laboratory work was either negative or within normal limits.  She does not require additional IV Venofer today.  Patient last received treatment on April 16, 2021.  Return to clinic in 4 months with repeat laboratory work, further evaluation, consideration of IV Venofer if needed. 2.  B12 deficiency: Patient's B12 levels are within normal limits.  Okay to decrease home injections to once per month.  Patient expressed understanding and was in agreement with this plan. She also understands that She can call clinic at any time with any questions, concerns, or complaints.    Lloyd Huger, MD   08/31/2021  10:34 AM

## 2021-08-28 ENCOUNTER — Other Ambulatory Visit: Payer: Self-pay

## 2021-08-28 ENCOUNTER — Inpatient Hospital Stay: Payer: Medicaid Other | Attending: Oncology

## 2021-08-28 DIAGNOSIS — Z7952 Long term (current) use of systemic steroids: Secondary | ICD-10-CM | POA: Insufficient documentation

## 2021-08-28 DIAGNOSIS — D509 Iron deficiency anemia, unspecified: Secondary | ICD-10-CM | POA: Insufficient documentation

## 2021-08-28 DIAGNOSIS — I422 Other hypertrophic cardiomyopathy: Secondary | ICD-10-CM | POA: Diagnosis not present

## 2021-08-28 DIAGNOSIS — Z809 Family history of malignant neoplasm, unspecified: Secondary | ICD-10-CM | POA: Diagnosis not present

## 2021-08-28 DIAGNOSIS — E538 Deficiency of other specified B group vitamins: Secondary | ICD-10-CM | POA: Insufficient documentation

## 2021-08-28 DIAGNOSIS — Z79899 Other long term (current) drug therapy: Secondary | ICD-10-CM | POA: Insufficient documentation

## 2021-08-28 LAB — CBC WITH DIFFERENTIAL/PLATELET
Abs Immature Granulocytes: 0.03 K/uL (ref 0.00–0.07)
Basophils Absolute: 0 K/uL (ref 0.0–0.1)
Basophils Relative: 0 %
Eosinophils Absolute: 0.1 K/uL (ref 0.0–0.5)
Eosinophils Relative: 1 %
HCT: 41 % (ref 36.0–46.0)
Hemoglobin: 13.4 g/dL (ref 12.0–15.0)
Immature Granulocytes: 0 %
Lymphocytes Relative: 25 %
Lymphs Abs: 2.2 K/uL (ref 0.7–4.0)
MCH: 30 pg (ref 26.0–34.0)
MCHC: 32.7 g/dL (ref 30.0–36.0)
MCV: 91.9 fL (ref 80.0–100.0)
Monocytes Absolute: 0.6 K/uL (ref 0.1–1.0)
Monocytes Relative: 7 %
Neutro Abs: 5.7 K/uL (ref 1.7–7.7)
Neutrophils Relative %: 67 %
Platelets: 338 K/uL (ref 150–400)
RBC: 4.46 MIL/uL (ref 3.87–5.11)
RDW: 13.1 % (ref 11.5–15.5)
WBC: 8.6 K/uL (ref 4.0–10.5)
nRBC: 0 % (ref 0.0–0.2)

## 2021-08-28 LAB — IRON AND TIBC
Iron: 64 ug/dL (ref 28–170)
Saturation Ratios: 19 % (ref 10.4–31.8)
TIBC: 342 ug/dL (ref 250–450)
UIBC: 278 ug/dL

## 2021-08-28 LAB — VITAMIN B12: Vitamin B-12: 216 pg/mL (ref 180–914)

## 2021-08-28 LAB — FERRITIN: Ferritin: 36 ng/mL (ref 11–307)

## 2021-08-29 ENCOUNTER — Inpatient Hospital Stay (HOSPITAL_BASED_OUTPATIENT_CLINIC_OR_DEPARTMENT_OTHER): Payer: Medicaid Other | Admitting: Oncology

## 2021-08-29 ENCOUNTER — Inpatient Hospital Stay: Payer: Medicaid Other

## 2021-08-29 VITALS — BP 140/90 | HR 68 | Temp 97.2°F | Resp 16 | Wt 199.3 lb

## 2021-08-29 DIAGNOSIS — E538 Deficiency of other specified B group vitamins: Secondary | ICD-10-CM | POA: Diagnosis not present

## 2021-08-29 DIAGNOSIS — D509 Iron deficiency anemia, unspecified: Secondary | ICD-10-CM

## 2021-08-29 NOTE — Progress Notes (Signed)
Pt reports chronic fatigue.

## 2021-08-31 ENCOUNTER — Encounter: Payer: Self-pay | Admitting: Oncology

## 2021-12-16 ENCOUNTER — Ambulatory Visit: Payer: Self-pay | Admitting: *Deleted

## 2021-12-16 NOTE — Telephone Encounter (Signed)
Reason for Disposition  [1] Depression AND [2] worsening (e.g.,sleeping poorly, less able to do activities of daily living)  Answer Assessment - Initial Assessment Questions 1. CONCERN: "What happened that made you call today?"     Yes- finding it hard to move forward 2. DEPRESSION SYMPTOM SCREENING: "How are you feeling overall?" (e.g., decreased energy, increased sleeping or difficulty sleeping, difficulty concentrating, feelings of sadness, guilt, hopelessness, or worthlessness)     Hard to do normal daily functions 3. RISK OF HARM - SUICIDAL IDEATION:  "Do you ever have thoughts of hurting or killing yourself?"  (e.g., yes, no, no but preoccupation with thoughts about death)   - INTENT:  "Do you have thoughts of hurting or killing yourself right NOW?" (e.g., yes, no, N/A)   - PLAN: "Do you have a specific plan for how you would do this?" (e.g., gun, knife, overdose, no plan, N/A)     No- no plan 4. RISK OF HARM - HOMICIDAL IDEATION:  "Do you ever have thoughts of hurting or killing someone else?"  (e.g., yes, no, no but preoccupation with thoughts about death)   - INTENT:  "Do you have thoughts of hurting or killing someone right NOW?" (e.g., yes, no, N/A)   - PLAN: "Do you have a specific plan for how you would do this?" (e.g., gun, knife, no plan, N/A)      no 5. FUNCTIONAL IMPAIRMENT: "How have things been going for you overall? Have you had more difficulty than usual doing your normal daily activities?"  (e.g., better, same, worse; self-care, school, work, interactions)     *No Answer* 6. SUPPORT: "Who is with you now?" "Who do you live with?" "Do you have family or friends who you can talk to?"      Normal daily activity hard and had to drop classes 7. THERAPIST: "Do you have a counselor or therapist? Name?"     no 8. STRESSORS: "Has there been any new stress or recent changes in your life?"     Aunt recently passed away 9. ALCOHOL USE OR SUBSTANCE USE (DRUG USE): "Do you drink  alcohol or use any illegal drugs?"     no 10. OTHER: "Do you have any other physical symptoms right now?" (e.g., fever)       no 11. PREGNANCY: "Is there any chance you are pregnant?" "When was your last menstrual period?"       na  Protocols used: Depression-A-AH

## 2021-12-16 NOTE — Telephone Encounter (Signed)
  Chief Complaint: depression Symptoms: finding it hard to do normal activity, doing bare minimum  Frequency: ongoing Pertinent Negatives: Patient denies suicidal thoughts/plan Disposition: [] ED /[] Urgent Care (no appt availability in office) / [x] Appointment(In office/virtual)/ []  [facility]/ [] Home Care/ [] Refused Recommended Disposition /[] [facility]/ []  Follow-up with PCP Additional Notes:  Recent life change events- ended engagement, aunt deceased

## 2021-12-17 ENCOUNTER — Encounter: Payer: Self-pay | Admitting: Family Medicine

## 2021-12-17 ENCOUNTER — Telehealth: Payer: Self-pay | Admitting: *Deleted

## 2021-12-17 ENCOUNTER — Ambulatory Visit: Payer: Medicaid Other | Admitting: Family Medicine

## 2021-12-17 VITALS — BP 148/76 | HR 58 | Temp 98.1°F | Resp 16 | Ht 64.0 in | Wt 210.2 lb

## 2021-12-17 DIAGNOSIS — F410 Panic disorder [episodic paroxysmal anxiety] without agoraphobia: Secondary | ICD-10-CM

## 2021-12-17 DIAGNOSIS — F5102 Adjustment insomnia: Secondary | ICD-10-CM | POA: Diagnosis not present

## 2021-12-17 DIAGNOSIS — F322 Major depressive disorder, single episode, severe without psychotic features: Secondary | ICD-10-CM | POA: Diagnosis not present

## 2021-12-17 DIAGNOSIS — R030 Elevated blood-pressure reading, without diagnosis of hypertension: Secondary | ICD-10-CM

## 2021-12-17 MED ORDER — HYDROXYZINE PAMOATE 25 MG PO CAPS: 25.0000 mg | ORAL_CAPSULE | Freq: Three times a day (TID) | ORAL | 1 refills | Status: DC | PRN

## 2021-12-17 MED ORDER — TRAZODONE HCL 50 MG PO TABS: 25.0000 mg | ORAL_TABLET | Freq: Every evening | ORAL | 3 refills | Status: DC | PRN

## 2021-12-17 MED ORDER — BUPROPION HCL ER (SR) 150 MG PO TB12: 150.0000 mg | ORAL_TABLET | Freq: Two times a day (BID) | ORAL | 1 refills | Status: DC

## 2021-12-17 MED ORDER — FLUOXETINE HCL 20 MG PO CAPS: 20.0000 mg | ORAL_CAPSULE | Freq: Every day | ORAL | 3 refills | Status: DC

## 2021-12-17 NOTE — Assessment & Plan Note (Signed)
Acute elevation, denies chest pain BP 148/76 Patient with some insomnia with depression/anxiety Will recommend trial of trazodone to assist  Will recheck BP in 6-8 wks

## 2021-12-17 NOTE — Assessment & Plan Note (Signed)
Acute, associated with depression Recommend trial of vistaril 25 mg TID with titration as needed Referral to psych if needed Advised that medication is not to be used with alcohol or to be used with heavy machinery

## 2021-12-17 NOTE — Progress Notes (Signed)
Established patient visit   Patient: Rebecca Lang   DOB: [birth year]   46 y.o. Female  MRN: [id number] Visit Date: 12/17/2021  Today's healthcare provider: Gwyneth Sprout, FNP  Re Introduced to nurse practitioner role and practice setting.  All questions answered.  Discussed provider/patient relationship and expectations.   I,Tiffany J Bragg,acting as a scribe for Gwyneth Sprout, FNP.,have documented all relevant documentation on the behalf of Gwyneth Sprout, FNP,as directed by  Gwyneth Sprout, FNP while in the presence of Gwyneth Sprout, FNP.   Chief Complaint  Patient presents with   Depression   Subjective    HPI  Depression, Follow-up  She  was last seen for this 9 months ago. Changes made at last visit include continue medication.   She reports poor compliance with treatment. She states she is out of the medications and needs refills.  She reports good tolerance of treatment, when she was still taking them. Current symptoms include: difficulty concentrating, fatigue, feelings of worthlessness/guilt, hopelessness, impaired memory, and insomnia She feels she is Worse since last visit.     12/17/2021   11:12 AM 03/20/2021    8:23 AM 03/20/2020   11:38 AM  Depression screen PHQ 2/9  Decreased Interest 3 0 0  Down, Depressed, Hopeless 3 0 0  PHQ - 2 Score 6 0 0  Altered sleeping 3 2 3   Tired, decreased energy 3 1 3   Change in appetite 3 3 0  Feeling bad or failure about yourself  3  0  Trouble concentrating 3 3 3   Moving slowly or fidgety/restless 0 0 0  Suicidal thoughts 0 0 0  PHQ-9 Score 21 9 9   Difficult doing work/chores Extremely dIfficult Extremely dIfficult Not difficult at all    -----------------------------------------------------------------------------------------   Medications: Outpatient Medications Prior to Visit  Medication Sig   [DISCONTINUED] blood glucose meter kit and supplies Check blood glucose fasting in am Dispense based on patient and  insurance preference. Use up to four times daily as directed. (FOR ICD-10 E10.9, E11.9). (Patient not taking: Reported on 08/29/2021)   [DISCONTINUED] buPROPion (WELLBUTRIN SR) 150 MG 12 hr tablet TAKE 1 TABLET BY MOUTH TWICE DAILY (START  WITH  ONE  TABLET  IN  THE  MORNING  FOR  3  DAYS  THEN  INCREASE  TO  TWICE  DAILY  AS  ABOVE) (Patient not taking: Reported on 12/17/2021)   [DISCONTINUED] Cyanocobalamin (B-12 COMPLIANCE INJECTION) 1000 MCG/ML KIT Inject 1,000 mcg as directed every 14 (fourteen) days. (Patient not taking: Reported on 12/17/2021)   [DISCONTINUED] dexmethylphenidate (FOCALIN XR) 20 MG 24 hr capsule Take 1 capsule (20 mg total) by mouth daily. (Patient not taking: Reported on 12/17/2021)   [DISCONTINUED] nystatin cream (MYCOSTATIN) Apply to affected area 2 times daily   [DISCONTINUED] predniSONE (STERAPRED UNI-PAK 21 TAB) 10 MG (21) TBPK tablet Take by mouth daily. As directed   No facility-administered medications prior to visit.    Review of Systems     Objective    BP (!) 148/76 (BP Location: Right Arm, Patient Position: Sitting, Cuff Size: Large)   Pulse (!) 58   Temp 98.1 F (36.7 C) (Oral)   Resp 16   Ht 5' 4"  (1.626 m)   Wt 210 lb 3.2 oz (95.3 kg)   SpO2 98%   BMI 36.08 kg/m    Physical Exam Vitals and nursing note reviewed.  Constitutional:      General: She is not  in acute distress.    Appearance: Normal appearance. She is obese. She is not ill-appearing, toxic-appearing or diaphoretic.  HENT:     Head: Normocephalic and atraumatic.  Cardiovascular:     Rate and Rhythm: Normal rate and regular rhythm.     Pulses: Normal pulses.     Heart sounds: Normal heart sounds. No murmur heard.   No friction rub. No gallop.  Pulmonary:     Effort: Pulmonary effort is normal. No respiratory distress.     Breath sounds: Normal breath sounds. No stridor. No wheezing, rhonchi or rales.  Chest:     Chest wall: No tenderness.  Abdominal:     General: Bowel sounds are  normal.     Palpations: Abdomen is soft.  Musculoskeletal:        General: No swelling, tenderness, deformity or signs of injury. Normal range of motion.     Right lower leg: No edema.     Left lower leg: No edema.  Skin:    General: Skin is warm and dry.     Capillary Refill: Capillary refill takes less than 2 seconds.     Coloration: Skin is not jaundiced or pale.     Findings: No bruising, erythema, lesion or rash.  Neurological:     General: No focal deficit present.     Mental Status: She is alert and oriented to person, place, and time. Mental status is at baseline.     Cranial Nerves: No cranial nerve deficit.     Sensory: No sensory deficit.     Motor: No weakness.     Coordination: Coordination normal.  Psychiatric:        Mood and Affect: Mood is depressed. Affect is flat.        Behavior: Behavior normal.        Thought Content: Thought content normal.        Judgment: Judgment normal.      No results found for any visits on 12/17/21.  Assessment & Plan     Problem List Items Addressed This Visit       Other   Adjustment insomnia    Acute, variable Will use trazodone to assist 25-50 mg qHS to assist with sleep hygiene        Relevant Medications   traZODone (DESYREL) 50 MG tablet   Depression, major, single episode, severe (HCC)    Chronic, worsening Feels that her 14 year old son is the only thing keeping her going Is doing the bare minimum at work Recommend start of 20 mg prozac Recommend restart of wellbutrin 150 mg BID I've explained to her that drugs of the SSRI class can have side effects such as weight gain, sexual dysfunction, insomnia, headache, nausea. These medications are generally effective at alleviating symptoms of anxiety and/or depression. Let me know if significant side effects do occur.        Relevant Medications   buPROPion (WELLBUTRIN SR) 150 MG 12 hr tablet   traZODone (DESYREL) 50 MG tablet   hydrOXYzine (VISTARIL) 25 MG  capsule   FLUoxetine (PROZAC) 20 MG capsule   Other Relevant Orders   AMB Referral to [place]   Elevated blood pressure reading without diagnosis of hypertension    Acute elevation, denies chest pain BP 148/76 Patient with some insomnia with depression/anxiety Will recommend trial of trazodone to assist  Will recheck BP in 6-8 wks        Panic attacks - Primary  Acute, associated with depression Recommend trial of vistaril 25 mg TID with titration as needed Referral to psych if needed Advised that medication is not to be used with alcohol or to be used with heavy machinery        Relevant Medications   buPROPion (WELLBUTRIN SR) 150 MG 12 hr tablet   traZODone (DESYREL) 50 MG tablet   hydrOXYzine (VISTARIL) 25 MG capsule   FLUoxetine (PROZAC) 20 MG capsule      Return in about 8 weeks (around 02/11/2022) for anxiety and depression.      Vonna Kotyk, FNP, have reviewed all documentation for this visit. The documentation on 12/17/21 for the exam, diagnosis, procedures, and orders are all accurate and complete.    Gwyneth Sprout, [facility] [phone] (phone) [phone] (fax)  [place]

## 2021-12-17 NOTE — Assessment & Plan Note (Signed)
Chronic, worsening Feels that her 44 year old son is the only thing keeping her going Is doing the bare minimum at work Recommend start of 20 mg prozac Recommend restart of wellbutrin 150 mg BID I've explained to her that drugs of the SSRI class can have side effects such as weight gain, sexual dysfunction, insomnia, headache, nausea. These medications are generally effective at alleviating symptoms of anxiety and/or depression. Let me know if significant side effects do occur.

## 2021-12-17 NOTE — Assessment & Plan Note (Signed)
Acute, variable Will use trazodone to assist 25-50 mg qHS to assist with sleep hygiene

## 2021-12-17 NOTE — Chronic Care Management (AMB) (Signed)
  Care Management   Outreach Note  12/17/2021 Name: Rebecca Lang MRN: [id number] DOB: 08-25-1975  Referred by: Jacky Kindle, FNP Reason for referral : Care Coordination (Initial outreach to schedule referral with Licensed Clinical SW)   An unsuccessful telephone outreach was attempted today. The patient was referred to the case management team for assistance with care management and care coordination.   Follow Up Plan:  A HIPAA compliant phone message was left for the patient providing contact information and requesting a return call.   Burman Nieves, CCMA Care Guide, Embedded Care Coordination [facility]  Care Management  Direct Dial: [phone]

## 2021-12-20 NOTE — Chronic Care Management (AMB) (Signed)
  Care Management   Outreach Note  12/20/2021 Name: DESSIE TATEM MRN: [id number] DOB: August 18, 1975  Referred by: Jacky Kindle, FNP Reason for referral : Care Coordination (Initial outreach to schedule referral with Licensed Clinical SW)   A second unsuccessful telephone outreach was attempted today. The patient was referred to the case management team for assistance with care management and care coordination.   Follow Up Plan:  No answer voicemail full, will follow up in 3 days   Burman Nieves, CCMA Care Guide, Embedded Care Coordination [name]  Care Management  Direct Dial: [phone]

## 2021-12-24 ENCOUNTER — Inpatient Hospital Stay: Payer: Medicaid Other | Attending: Nurse Practitioner

## 2021-12-24 DIAGNOSIS — E538 Deficiency of other specified B group vitamins: Secondary | ICD-10-CM | POA: Insufficient documentation

## 2021-12-24 DIAGNOSIS — D509 Iron deficiency anemia, unspecified: Secondary | ICD-10-CM | POA: Insufficient documentation

## 2021-12-24 DIAGNOSIS — Z79899 Other long term (current) drug therapy: Secondary | ICD-10-CM | POA: Insufficient documentation

## 2021-12-24 DIAGNOSIS — Z9884 Bariatric surgery status: Secondary | ICD-10-CM | POA: Insufficient documentation

## 2021-12-26 ENCOUNTER — Inpatient Hospital Stay (HOSPITAL_BASED_OUTPATIENT_CLINIC_OR_DEPARTMENT_OTHER): Payer: Medicaid Other | Admitting: Nurse Practitioner

## 2021-12-26 ENCOUNTER — Ambulatory Visit: Payer: Medicaid Other

## 2021-12-26 ENCOUNTER — Other Ambulatory Visit: Payer: Self-pay

## 2021-12-26 ENCOUNTER — Inpatient Hospital Stay: Payer: Medicaid Other

## 2021-12-26 DIAGNOSIS — Z9884 Bariatric surgery status: Secondary | ICD-10-CM | POA: Diagnosis not present

## 2021-12-26 DIAGNOSIS — D509 Iron deficiency anemia, unspecified: Secondary | ICD-10-CM | POA: Diagnosis present

## 2021-12-26 DIAGNOSIS — E538 Deficiency of other specified B group vitamins: Secondary | ICD-10-CM | POA: Diagnosis present

## 2021-12-26 DIAGNOSIS — Z79899 Other long term (current) drug therapy: Secondary | ICD-10-CM | POA: Diagnosis not present

## 2021-12-26 LAB — CBC WITH DIFFERENTIAL/PLATELET
Abs Immature Granulocytes: 0.02 10*3/uL (ref 0.00–0.07)
Basophils Absolute: 0 10*3/uL (ref 0.0–0.1)
Basophils Relative: 0 %
Eosinophils Absolute: 0 10*3/uL (ref 0.0–0.5)
Eosinophils Relative: 1 %
HCT: 37.9 % (ref 36.0–46.0)
Hemoglobin: 12.6 g/dL (ref 12.0–15.0)
Immature Granulocytes: 0 %
Lymphocytes Relative: 24 %
Lymphs Abs: 1.7 10*3/uL (ref 0.7–4.0)
MCH: 31 pg (ref 26.0–34.0)
MCHC: 33.2 g/dL (ref 30.0–36.0)
MCV: 93.1 fL (ref 80.0–100.0)
Monocytes Absolute: 0.4 10*3/uL (ref 0.1–1.0)
Monocytes Relative: 5 %
Neutro Abs: 4.9 10*3/uL (ref 1.7–7.7)
Neutrophils Relative %: 70 %
Platelets: 313 10*3/uL (ref 150–400)
RBC: 4.07 MIL/uL (ref 3.87–5.11)
RDW: 13.7 % (ref 11.5–15.5)
WBC: 7.1 10*3/uL (ref 4.0–10.5)
nRBC: 0 % (ref 0.0–0.2)

## 2021-12-26 LAB — IRON AND TIBC
Iron: 147 ug/dL (ref 28–170)
Saturation Ratios: 30 % (ref 10.4–31.8)
TIBC: 484 ug/dL — ABNORMAL HIGH (ref 250–450)
UIBC: 337 ug/dL

## 2021-12-26 LAB — FERRITIN: Ferritin: 11 ng/mL (ref 11–307)

## 2021-12-26 NOTE — Chronic Care Management (AMB) (Signed)
  Care Management   Note  12/26/2021 Name: Rebecca Lang MRN: [id number] DOB: 09-09-1975  Rebecca Lang is a 46 y.o. year old female who is a primary care patient of Jacky Kindle, FNP. I reached out to Thompson Caul by phone today offer care coordination services.   Rebecca Lang was given information about care management services today including:  Care management services include personalized support from designated clinical staff supervised by her physician, including individualized plan of care and coordination with other care providers 24/7 contact phone numbers for assistance for urgent and routine care needs. The patient may stop care management services at any time by phone call to the office staff.  Patient agreed to services and verbal consent obtained.   Follow up plan: Telephone appointment with care management team member scheduled for: 01/02/2022  Burman Nieves, CCMA Care Guide, Embedded Care Coordination [facility]  Care Management  Direct Dial: [phone]

## 2021-12-27 ENCOUNTER — Inpatient Hospital Stay (HOSPITAL_BASED_OUTPATIENT_CLINIC_OR_DEPARTMENT_OTHER): Payer: Medicaid Other | Admitting: Nurse Practitioner

## 2021-12-27 ENCOUNTER — Other Ambulatory Visit: Payer: Self-pay

## 2021-12-27 ENCOUNTER — Encounter: Payer: Self-pay | Admitting: Oncology

## 2021-12-27 ENCOUNTER — Encounter: Payer: Self-pay | Admitting: Nurse Practitioner

## 2021-12-27 DIAGNOSIS — D509 Iron deficiency anemia, unspecified: Secondary | ICD-10-CM | POA: Diagnosis not present

## 2021-12-27 DIAGNOSIS — E538 Deficiency of other specified B group vitamins: Secondary | ICD-10-CM

## 2021-12-27 MED ORDER — CYANOCOBALAMIN 1000 MCG/ML IJ SOLN: 1000.0000 ug | INTRAMUSCULAR | 5 refills | Status: DC

## 2021-12-27 MED ORDER — "BD SAFETYGLIDE SYRINGE/NEEDLE 25G X 1"" 3 ML MISC": 0 refills | Status: DC

## 2021-12-27 NOTE — Progress Notes (Signed)
[facility]  Telephone:([phone] [phone]  Virtual Visit Progress Note  I connected with Rebecca Lang on 12/27/21 at 10:30 AM EDT by video enabled telemedicine visit and verified that I am speaking with the correct person using two identifiers.   I discussed the limitations, risks, security and privacy concerns of performing an evaluation and management service by telemedicine and the availability of in-person appointments. I also discussed with the patient that there may be a patient responsible charge related to this service. The patient expressed understanding and agreed to proceed.   Other persons participating in the visit and their role in the encounter: none   Patient's location: car  Provider's location: clinic   Chief Complaint: Iron deficiency  ID: Rebecca Lang OB: 1975/09/01  MR#: [id number]  ACZ#:[id number]  Patient Care Team: Jacky Kindle, FNP as PCP - General (Family Medicine) Debbe Odea, MD as PCP - Cardiology (Cardiology) Jeralyn Ruths, MD as Consulting Physician (Hematology and Oncology) Wenda Overland, [place] as Social Worker  CHIEF COMPLAINT: Iron deficiency and B12 deficiency anemia.  INTERVAL HISTORY: Patient agrees to virtual visit for follow up for history of iron deficiency and b12 deficiency. She feels fatigued but at baseline. No weakness, neurologic complaints. No fevers or illness. Appetite is stable. Denies weight loss. No chest pain, shortness of breath, cough, or hemoptysis. She denies nasuea, vomiting, abdominal pain, blood in her stool. Requests refill of b12.   REVIEW OF SYSTEMS:   Review of Systems  Constitutional: Negative.  Negative for fever, malaise/fatigue and weight loss.  Respiratory: Negative.  Negative for cough and shortness of breath.   Cardiovascular: Negative.  Negative for chest pain and leg swelling.  Gastrointestinal: Negative.  Negative for abdominal pain, blood in stool  and melena.  Genitourinary: Negative.  Negative for dysuria and hematuria.  Musculoskeletal: Negative.  Negative for back pain.  Skin: Negative.  Negative for rash.  Neurological: Negative.  Negative for focal weakness, weakness and headaches.  Psychiatric/Behavioral: Negative.  The patient is not nervous/anxious.   As per HPI. Otherwise, a complete review of systems is negative.  PAST MEDICAL HISTORY: Past Medical History:  Diagnosis Date   Anemia    Anxiety    Back pain    Depression    Hypertrophic cardiomyopathy (HCC)     PAST SURGICAL HISTORY: Past Surgical History:  Procedure Laterality Date   ABDOMINAL SURGERY  2004   gastric bypass   CESAREAN SECTION     DIAGNOSTIC LAPAROSCOPY WITH REMOVAL OF ECTOPIC PREGNANCY Right 12/03/2018   Procedure: DIAGNOSTIC LAPAROSCOPY WITH REMOVAL OF RIGHT ECTOPIC PREGNANCY AND RIGHT OVARY;  Surgeon: Christeen Douglas, MD;  Location: [facility] ORS;  Service: Gynecology;  Laterality: Right;   INNER EAR SURGERY     LAPAROSCOPIC GASTRIC BYPASS  2004   tubal rupture      FAMILY HISTORY: Family History  Problem Relation Age of Onset   Arthritis Mother    Other Mother        back pain w stimulator ( internal )   Kidney disease Father    Intestinal polyp Father    Behavior problems Sister    Diabetes Maternal Grandfather    Kidney disease Paternal Grandmother    Cancer Paternal Grandfather     ADVANCED DIRECTIVES (Y/N):  N  HEALTH MAINTENANCE: Social History   Tobacco Use   Smoking status: Never    Passive exposure: Yes   Smokeless tobacco: Never  Vaping Use   Vaping Use: Never  used  Substance Use Topics   Alcohol use: Yes    Comment: socially beer   Drug use: No     Colonoscopy:  PAP:  Bone density:  Lipid panel:  Allergies  Allergen Reactions   Clindamycin/Lincomycin     Rash    Current Outpatient Medications  Medication Sig Dispense Refill   buPROPion (WELLBUTRIN SR) 150 MG 12 hr tablet Take 1 tablet (150 mg total) by  mouth 2 (two) times daily. 180 tablet 1   FLUoxetine (PROZAC) 20 MG capsule Take 1 capsule (20 mg total) by mouth daily. 90 capsule 3   hydrOXYzine (VISTARIL) 25 MG capsule Take 1 capsule (25 mg total) by mouth every 8 (eight) hours as needed for anxiety. 90 capsule 1   traZODone (DESYREL) 50 MG tablet Take 0.5-1 tablets (25-50 mg total) by mouth at bedtime as needed for sleep. 30 tablet 3   VITAMIN B1-B12 IM Inject into the muscle. Gives to self at home     No current facility-administered medications for this visit.    OBJECTIVE: There were no vitals filed for this visit.    There is no height or weight on file to calculate BMI.    ECOG FS:0 - Asymptomatic  General: Well-developed, well-nourished, no acute distress. Lungs: No audible wheezing or coughing Neuro: Alert, answering all questions appropriately.  Psych: Normal affect.  LAB RESULTS:  Lab Results  Component Value Date   NA 139 03/20/2021   K 4.2 03/20/2021   CL 104 03/20/2021   CO2 23 03/20/2021   GLUCOSE 80 03/20/2021   BUN 12 03/20/2021   CREATININE 0.66 03/20/2021   CALCIUM 9.2 03/20/2021   PROT 5.9 (L) 03/20/2021   ALBUMIN 3.6 (L) 03/20/2021   AST 18 03/20/2021   ALT 16 03/20/2021   ALKPHOS 72 03/20/2021   BILITOT 0.5 03/20/2021   GFRNONAA 113 12/16/2019   GFRAA 130 12/16/2019    Lab Results  Component Value Date   WBC 7.1 12/26/2021   NEUTROABS 4.9 12/26/2021   HGB 12.6 12/26/2021   HCT 37.9 12/26/2021   MCV 93.1 12/26/2021   PLT 313 12/26/2021   Lab Results  Component Value Date   IRON 147 12/26/2021   TIBC 484 (H) 12/26/2021   IRONPCTSAT 30 12/26/2021   Lab Results  Component Value Date   FERRITIN 11 12/26/2021     STUDIES: No results found.  ASSESSMENT: Iron deficiency and B12 deficiency anemia  PLAN:   1. Iron deficiency anemia: Likely secondary to poor absorption from patient's history of gastric bypass surgery.  Patient's hemoglobin and iron stores continue to be within normal  limits.  Previously, the remainder of her laboratory work was either negative or within normal limits.  She does not require additional IV Venofer today.  Patient last received treatment on April 16, 2021.  Return to clinic in 4 months with repeat laboratory work, further evaluation, consideration of IV Venofer if needed.  2.  B12 deficiency: Patient's B12 levels are within normal limits. Continue injection of 1000 mcg/1 ml into muscle once a month. Check b12 at next visit. Patient self administers. Refill provided along with refill of syringes & needles.   Disposition: Rtc in 4 mo for lab Day to week later virtual or in person visit (pt preference). If in person, +/- venofer- la   I discussed the assessment and treatment plan with the patient. The patient was provided an opportunity to ask questions and all were answered. The patient agreed with the plan  and demonstrated an understanding of the instructions.   The patient was advised to call back or seek an in-person evaluation if the symptoms worsen or if the condition fails to improve as anticipated.   I spent 20 minutes face-to-face video visit time dedicated to the care of this patient on the date of this encounter to include pre-visit review of prior notes, labs, medication review, face-to-face time with the patient, and post visit ordering of testing/documentation.   Alinda Dooms, NP   12/27/2021

## 2021-12-27 NOTE — Progress Notes (Signed)
Patient called/ pre- screened for virtual appoinment today. Concerns of fatigue

## 2021-12-27 NOTE — Progress Notes (Signed)
Appt rescheduled

## 2022-01-01 NOTE — Chronic Care Management (AMB) (Signed)
Pt mistakenly placed on LCSW schedule - send to managed medicaid to be scheduled

## 2022-01-02 ENCOUNTER — Telehealth: Payer: Medicaid Other

## 2022-01-08 ENCOUNTER — Other Ambulatory Visit: Payer: Self-pay

## 2022-01-08 NOTE — Patient Instructions (Signed)
Thompson Caul ,   [facility] Managed Care Team is available to provide assistance to you with your healthcare needs at no cost and as a benefit of your [facility] plan. I'm sorry I was unable to reach you today for our scheduled appointment. Our care guide will call you to reschedule our telephone appointment. Please call me at the number below. I am available to be of assistance to you regarding your healthcare needs. .   Thank you,   Dickie La, BSW, MSW, LCSW Managed Medicaid LCSW [facility]  [address] [email or web address] Phone: [phone]

## 2022-01-08 NOTE — Patient Outreach (Signed)
[facility]) Care Management  01/08/2022  NEREYDA BOWLER [birth year] [id number]  LCSW completed initial [facility] outreach attempt today during scheduled appointment time but was unable to reach patient successfully. A HIPPA compliant voice message was left encouraging patient to return call once available. LCSW will ask Scheduling Care Guide to reschedule [facility] SW appointment with patient as well.  Dickie La, BSW, MSW, [employer] Managed Medicaid LCSW [facility]  [facility] [email or web address] Phone: [phone]

## 2022-01-10 ENCOUNTER — Telehealth: Payer: Self-pay | Admitting: Family Medicine

## 2022-01-10 NOTE — Telephone Encounter (Signed)
..   Medicaid Managed Care   Unsuccessful Outreach Note  01/10/2022 Name: SUMMERLYNN GLAUSER MRN: [id number] DOB: 1976-03-21  Referred by: Jacky Kindle, FNP Reason for referral : High Risk Managed Medicaid (I called the patient today to get her rescheduled with the MM LCSW. I left my name and number on her VM.)   A second unsuccessful telephone outreach was attempted today. The patient was referred to the case management team for assistance with care management and care coordination.   Follow Up Plan: The care management team will reach out to the patient again over the next 7 days.    Weston Settle Care Guide, High Risk Medicaid Managed Care Embedded Care Coordination [facility]  [facility]    SIGNATURE

## 2022-01-10 NOTE — Progress Notes (Deleted)
      Established patient visit   Patient: Rebecca Lang   DOB: 11/16/1975   46 y.o. Female  MRN: [id number] Visit Date: 01/28/2022  Today's healthcare provider: Jacky Kindle, FNP   No chief complaint on file.  Subjective    HPI  Anxiety/Depression, Follow-up  She was last seen for anxiety 6 weeks ago. Changes made at last visit include start of 20 mg prozac, and restart of wellbutrin 150 mg BID.   She reports {excellent/good/fair/poor:19665} compliance with treatment. She reports {good/fair/poor:18685} tolerance of treatment. She {is/is not:21021397} having side effects. {document side effects if present:1}  She feels her anxiety is {Desc; severity:60313} and {improved/worse/unchanged:3041574} since last visit.  Symptoms: {Yes/No:20286} chest pain {Yes/No:20286} difficulty concentrating  {Yes/No:20286} dizziness {Yes/No:20286} fatigue  {Yes/No:20286} feelings of losing control {Yes/No:20286} insomnia  {Yes/No:20286} irritable {Yes/No:20286} palpitations  {Yes/No:20286} panic attacks {Yes/No:20286} racing thoughts  {Yes/No:20286} shortness of breath {Yes/No:20286} sweating  {Yes/No:20286} tremors/shakes    GAD-7 Results     No data to display          PHQ-9 Scores    12/17/2021   11:12 AM 03/20/2021    8:23 AM 03/20/2020   11:38 AM  PHQ9 SCORE ONLY  PHQ-9 Total Score 21 9 9     ---------------------------------------------------------------------------------------------------   Medications: Outpatient Medications Prior to Visit  Medication Sig   buPROPion (WELLBUTRIN SR) 150 MG 12 hr tablet Take 1 tablet (150 mg total) by mouth 2 (two) times daily.   cyanocobalamin (,VITAMIN B-12,) 1000 MCG/ML injection Inject 1 mL (1,000 mcg total) into the muscle every 30 (thirty) days.   FLUoxetine (PROZAC) 20 MG capsule Take 1 capsule (20 mg total) by mouth daily.   hydrOXYzine (VISTARIL) 25 MG capsule Take 1 capsule (25 mg total) by mouth every 8 (eight) hours as needed  for anxiety.   SYRINGE-NEEDLE, DISP, 3 ML (BD SAFETYGLIDE SYRINGE/NEEDLE) 25G X 1" 3 ML MISC Use the needle for IM injection of b12 once a month.   traZODone (DESYREL) 50 MG tablet Take 0.5-1 tablets (25-50 mg total) by mouth at bedtime as needed for sleep.   No facility-administered medications prior to visit.    Review of Systems  {Labs  Heme  Chem  Endocrine  Serology  Results Review (optional):23779}   Objective    There were no vitals taken for this visit. {Show previous vital signs (optional):23777}  Physical Exam  ***  No results found for any visits on 01/28/22.  Assessment & Plan     ***  No follow-ups on file.      {provider attestation***:1}   01/30/22, FNP  [facility] [phone] (phone) [phone] (fax)  [facility]

## 2022-01-17 ENCOUNTER — Telehealth: Payer: Self-pay | Admitting: Family Medicine

## 2022-01-17 NOTE — Telephone Encounter (Signed)
..   Medicaid Managed Care   Unsuccessful Outreach Note  01/17/2022 Name: Rebecca Lang MRN: [id number] DOB: 1976/05/25  Referred by: Jacky Kindle, FNP Reason for referral : High Risk Managed Medicaid (I called the patient today to get her rescheduled with the MM LCSW. I left my name and number on her VM. This was the third attempt to reach the patient.)   Third unsuccessful telephone outreach was attempted today. The patient was referred to the case management team for assistance with care management and care coordination. The patient's primary care provider has been notified of our unsuccessful attempts to make or maintain contact with the patient. The care management team is pleased to engage with this patient at any time in the future should he/she be interested in assistance from the care management team.   Follow Up Plan: We have been unable to make contact with the patient for follow up. The care management team is available to follow up with the patient after provider conversation with the patient regarding recommendation for care management engagement and subsequent re-referral to the care management team.    Weston Settle Care Guide, High Risk Medicaid Managed Care Embedded Care Coordination [facility]  [facility]    SIGNATURE

## 2022-01-28 ENCOUNTER — Ambulatory Visit: Payer: Medicaid Other | Admitting: Family Medicine

## 2022-02-05 ENCOUNTER — Ambulatory Visit: Payer: Medicaid Other | Admitting: Family Medicine

## 2022-02-05 NOTE — Progress Notes (Deleted)
      Established patient visit   Patient: Rebecca Lang   DOB: Mar 01, 1976   46 y.o. Female  MRN: [id number] Visit Date: 02/05/2022  Today's healthcare provider: Jacky Kindle, FNP   No chief complaint on file.  Subjective    HPI  Depression, Follow-up  She  was last seen for this on 06/06/023. Changes made at last visit include starting 20 mg prozac and restarting wellbutrin 150 mg BID.   She reports {excellent/good/fair/poor:19665} compliance with treatment. She {is/is not:21021397} having side effects. ***  She reports {DESC; GOOD/FAIR/POOR:18685} tolerance of treatment. Current symptoms include: {Symptoms; depression:1002} She feels she is {improved/worse/unchanged:3041574} since last visit.     12/17/2021   11:12 AM 03/20/2021    8:23 AM 03/20/2020   11:38 AM  Depression screen PHQ 2/9  Decreased Interest 3 0 0  Down, Depressed, Hopeless 3 0 0  PHQ - 2 Score 6 0 0  Altered sleeping 3 2 3   Tired, decreased energy 3 1 3   Change in appetite 3 3 0  Feeling bad or failure about yourself  3  0  Trouble concentrating 3 3 3   Moving slowly or fidgety/restless 0 0 0  Suicidal thoughts 0 0 0  PHQ-9 Score 21 9 9   Difficult doing work/chores Extremely dIfficult Extremely dIfficult Not difficult at all    -----------------------------------------------------------------------------------------   Follow up for insomnia:  The patient was last seen for this on 12/17/2021. Changes made at last visit include starting Trazodone.  She reports {excellent/good/fair/poor:19665} compliance with treatment. She feels that condition is {improved/worse/unchanged:3041574}. She {is/is not:21021397} having side effects. ***  -----------------------------------------------------------------------------------------   Medications: Outpatient Medications Prior to Visit  Medication Sig   buPROPion (WELLBUTRIN SR) 150 MG 12 hr tablet Take 1 tablet (150 mg total) by mouth 2 (two) times  daily.   cyanocobalamin (,VITAMIN B-12,) 1000 MCG/ML injection Inject 1 mL (1,000 mcg total) into the muscle every 30 (thirty) days.   FLUoxetine (PROZAC) 20 MG capsule Take 1 capsule (20 mg total) by mouth daily.   hydrOXYzine (VISTARIL) 25 MG capsule Take 1 capsule (25 mg total) by mouth every 8 (eight) hours as needed for anxiety.   SYRINGE-NEEDLE, DISP, 3 ML (BD SAFETYGLIDE SYRINGE/NEEDLE) 25G X 1" 3 ML MISC Use the needle for IM injection of b12 once a month.   traZODone (DESYREL) 50 MG tablet Take 0.5-1 tablets (25-50 mg total) by mouth at bedtime as needed for sleep.   No facility-administered medications prior to visit.    Review of Systems  {Labs  Heme  Chem  Endocrine  Serology  Results Review (optional):23779}   Objective    There were no vitals taken for this visit. {Show previous vital signs (optional):23777}  Physical Exam  ***  No results found for any visits on 02/05/22.  Assessment & Plan     ***  No follow-ups on file.      {provider attestation***:1}   , FNP  [facility] [phone] (phone) [phone] (fax)  [facility]

## 2022-02-12 IMAGING — MG MM DIGITAL DIAGNOSTIC UNILAT*L* W/ TOMO W/ CAD
8 series · 8 of 24 positions shown · non-contrast
Comparison: Previous exam(s).

CLINICAL DATA: 44-year-old female presenting for evaluation of a
palpable lump in the inferior left breast at the inframammary fold.

EXAM:
DIGITAL DIAGNOSTIC UNILATERAL LEFT MAMMOGRAM WITH TOMO AND CAD;
ULTRASOUND LEFT BREAST LIMITED

[L TAN synth-2D]
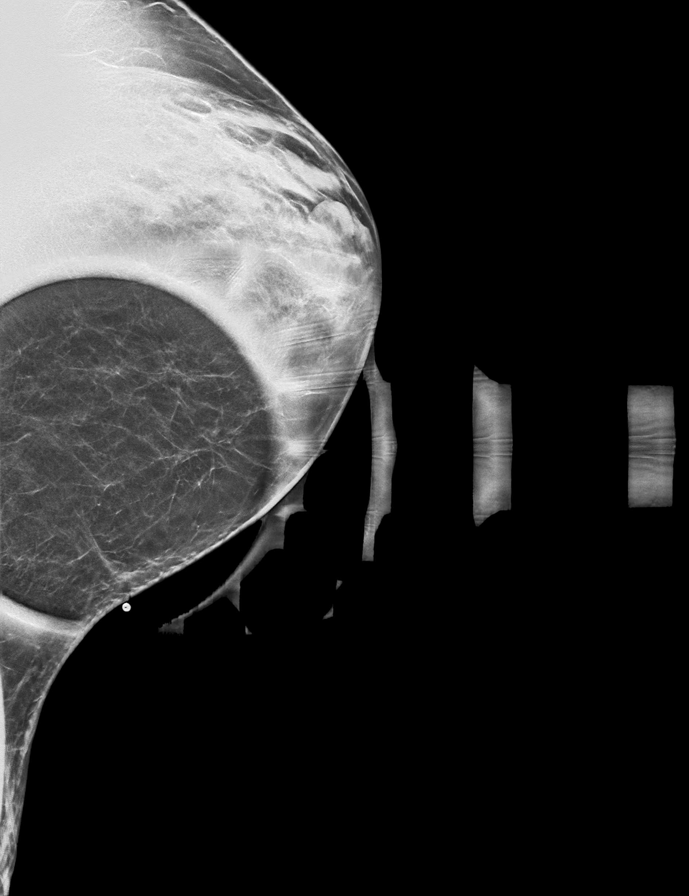

[L MLO synth-2D]
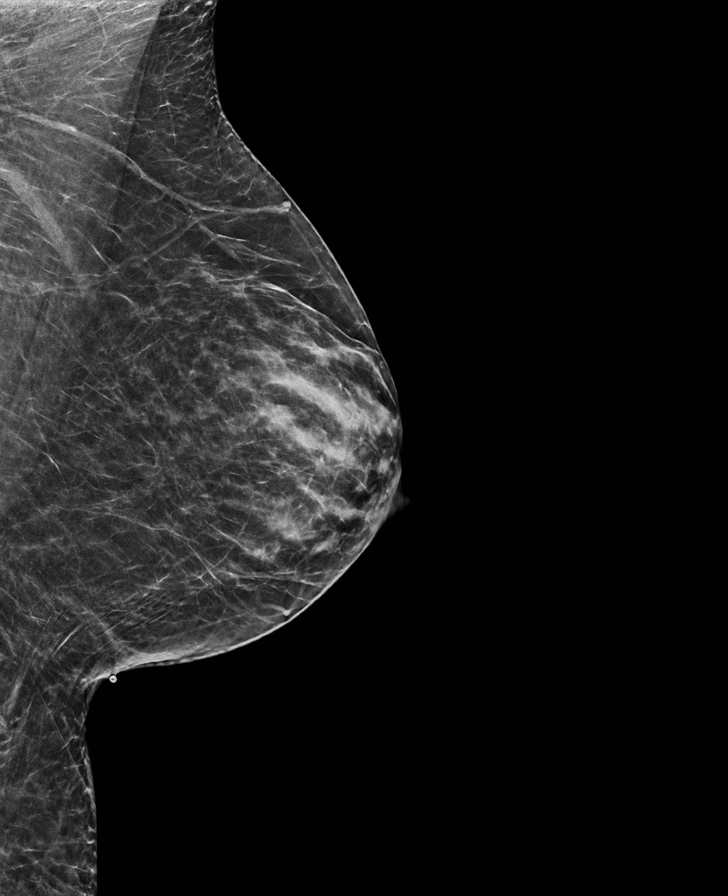

[L AT synth-2D]
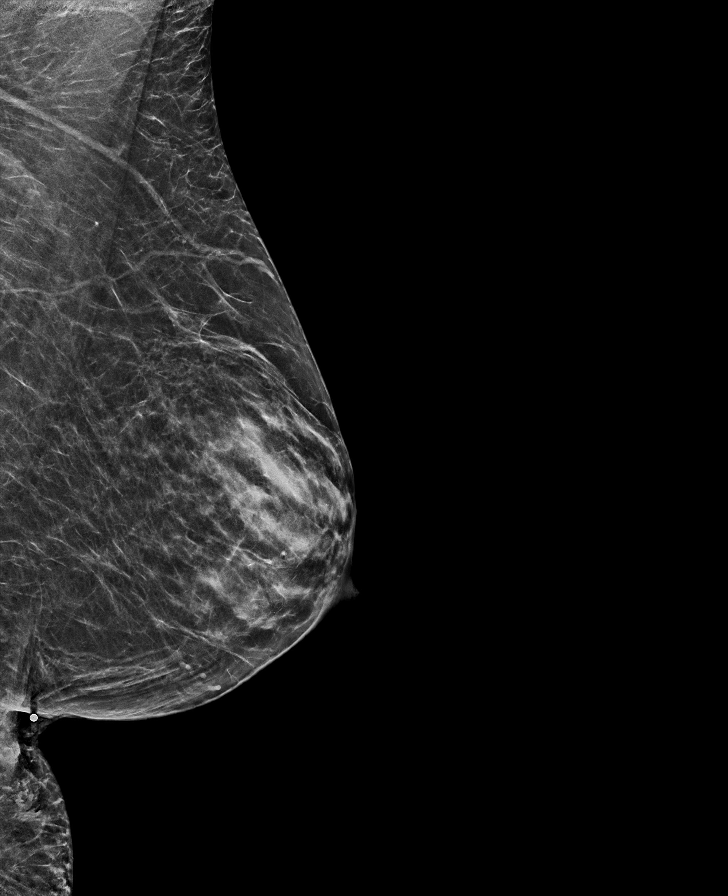

[L CC synth-2D]
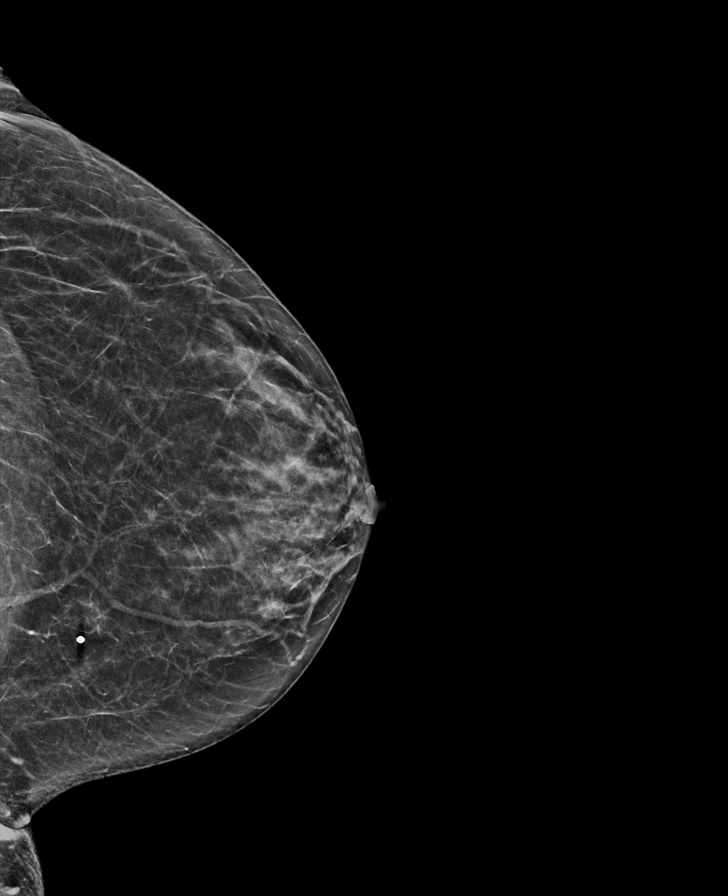

[L TAN tomo · tomo slice 24/47.0]
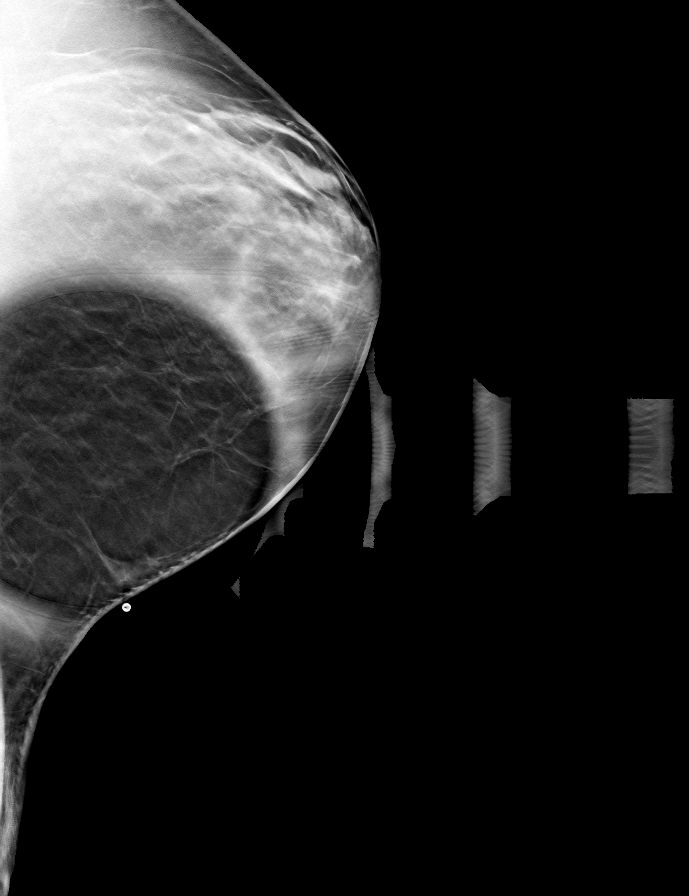

[L AT tomo · tomo slice 29/58.0]
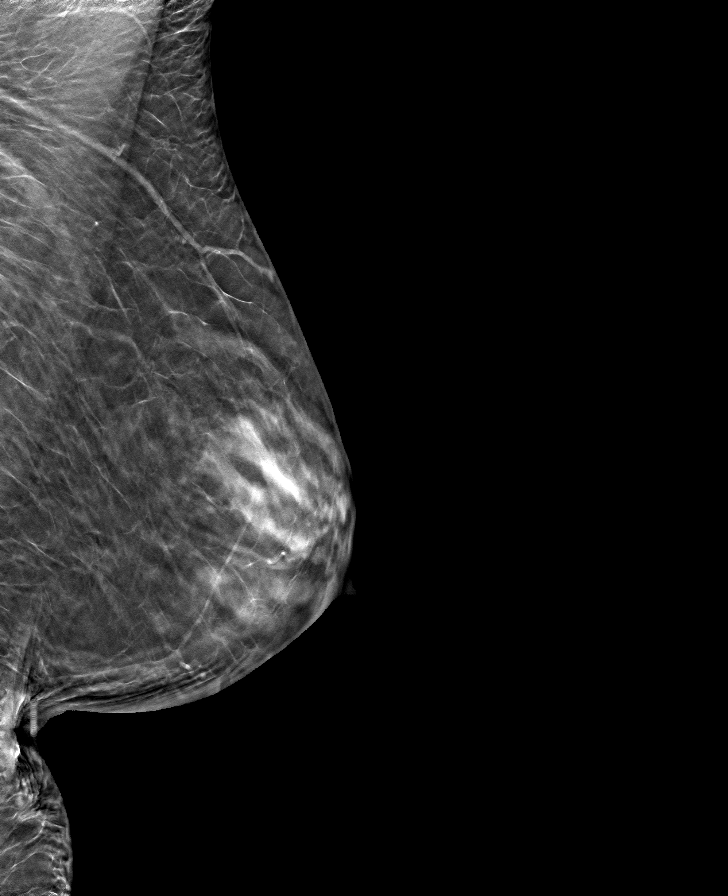

[L MLO tomo · tomo slice 29/58.0]
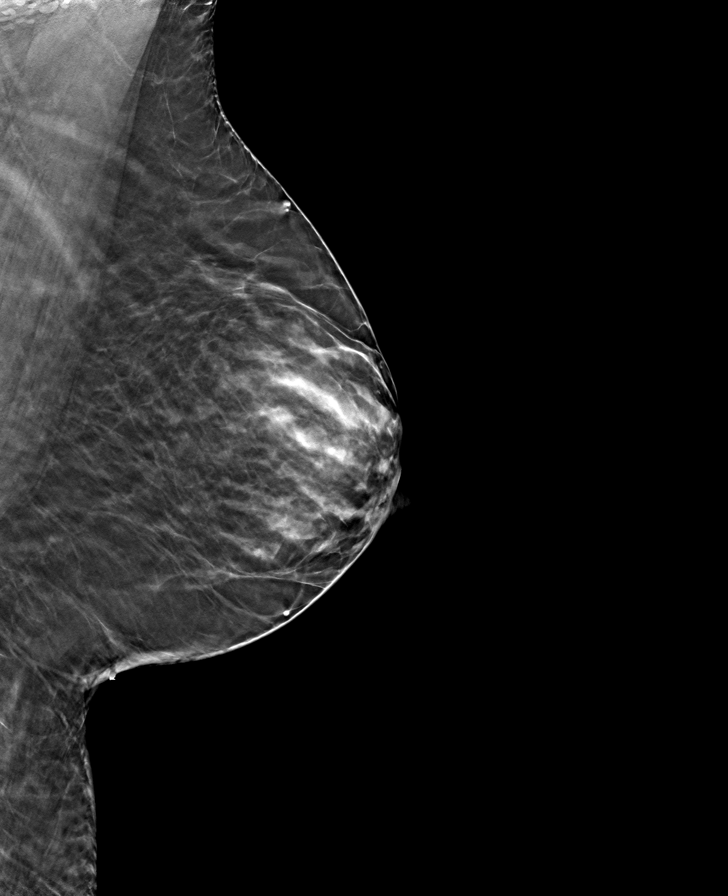

[L CC tomo · tomo slice 29/56.0]
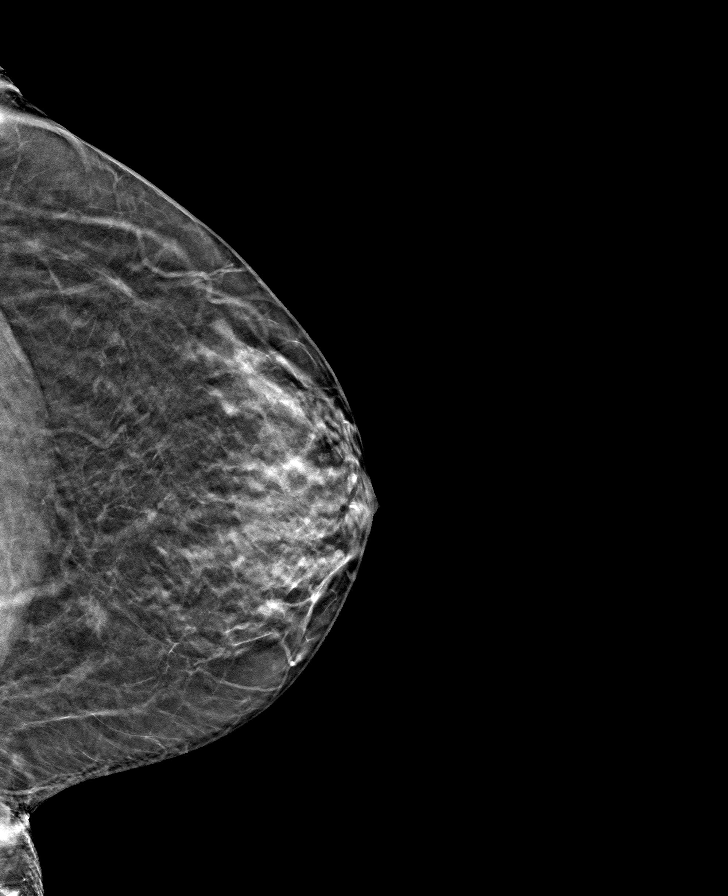

[8 of 24 positions shown; findings below may reference images not displayed]

ACR Breast Density Category c: The breast tissue is heterogeneously
dense, which may obscure small masses.
FINDINGS: A BB indicating the palpable site of concern has been placed on the
inferior posterior aspect of the left breast. There is no suspicious
abnormality on the spot compression tomosynthesis images over the
palpable site of concern. No suspicious calcifications, masses or
areas of distortion are seen in the left breast.

Mammographic images were processed with CAD.

Physical exam of the small superficial palpable site in the inferior
left breast at the inframammary fold demonstrates a small punctum at
the palpable site.

Ultrasound targeted to the left breast at 7 o'clock, 8 cm from the
nipple demonstrates a superficial hypoechoic mass with a tract
extending to the skin surface. The mass measures 4 mm.
IMPRESSION: 1. The palpable mass in the left breast corresponds with a benign
sebaceous cyst.

2.  No evidence of left breast malignancy.

RECOMMENDATION:
Return to routine screening mammography is recommended. The patient
will be due for screening in Friday December, 2019.

I have discussed the findings and recommendations with the patient.
If applicable, a reminder letter will be sent to the patient
regarding the next appointment.

BI-RADS CATEGORY  2: Benign.

## 2022-04-21 ENCOUNTER — Encounter: Payer: Self-pay | Admitting: Oncology

## 2022-04-28 ENCOUNTER — Other Ambulatory Visit: Payer: Medicaid Other

## 2022-04-29 ENCOUNTER — Telehealth: Payer: Medicaid Other | Admitting: Nurse Practitioner

## 2022-05-15 ENCOUNTER — Encounter: Payer: Self-pay | Admitting: Oncology

## 2022-05-15 ENCOUNTER — Ambulatory Visit: Payer: Medicaid Other | Admitting: Family Medicine

## 2022-05-16 ENCOUNTER — Encounter: Payer: Self-pay | Admitting: Oncology

## 2022-05-16 ENCOUNTER — Ambulatory Visit: Payer: Medicaid Other | Admitting: Family Medicine

## 2022-05-16 ENCOUNTER — Ambulatory Visit
Admission: EM | Admit: 2022-05-16 | Discharge: 2022-05-16 | Disposition: A | Discharge status: Home / Self Care | Payer: BLUE CROSS/BLUE SHIELD | Attending: Emergency Medicine | Admitting: Emergency Medicine

## 2022-05-16 DIAGNOSIS — R21 Rash and other nonspecific skin eruption: Secondary | ICD-10-CM

## 2022-05-16 MED ORDER — HYDROXYZINE HCL 25 MG PO TABS
25.0000 mg | ORAL_TABLET | Freq: Four times a day (QID) | ORAL | 0 refills | Status: DC | PRN
Start: 2022-05-16 — End: 2023-04-02

## 2022-05-16 MED ORDER — NYSTATIN 100000 UNIT/GM EX CREA
TOPICAL_CREAM | CUTANEOUS | 0 refills | Status: DC
Start: 2022-05-16 — End: 2023-04-02

## 2022-05-16 MED ORDER — PREDNISONE 10 MG (21) PO TBPK: ORAL_TABLET | Freq: Every day | ORAL | 0 refills | Status: DC

## 2022-05-16 NOTE — Discharge Instructions (Addendum)
Use the Nystatin cream as directed.  Take the prednisone and hydroxyzine as directed.  Do not drive, operate machinery, drink alcohol, or perform dangerous activities while taking hydroxyzine as it may cause drowsiness.  Follow up with your primary care provider on Monday.

## 2022-05-16 NOTE — ED Provider Notes (Signed)
Rebecca Lang    CSN: [id number] Arrival date & time: 05/16/22  1604      History   Chief Complaint Chief Complaint  Patient presents with   Rash    HPI Rebecca Lang is a 46 y.o. female.  Patient presents with 1 week history of pruritic rash.  The rash started on her back but has progressed to the skin fold of her upper abdomen and bilateral groin.  No known cause; no new products, medications, foods.  No fever, sore throat, cough, difficulty breathing, vomiting, diarrhea, or other symptoms.  Treatment at home with Benadryl.  Patient reports recurrent rashes over the last couple of years.  She states she was seen by an allergist and told she does not have any allergies.  The history is provided by the patient and medical records.    Past Medical History:  Diagnosis Date   Anemia    Anxiety    Back pain    Depression    Hypertrophic cardiomyopathy (HCC)     Patient Active Problem List   Diagnosis Date Noted   Panic attacks 12/17/2021   Depression, major, single episode, severe (HCC) 12/17/2021   Adjustment insomnia 12/17/2021   Elevated blood pressure reading without diagnosis of hypertension 12/17/2021   Bradycardia 03/20/2021   Fatigue 03/20/2021   Weight gain 03/20/2021   Galactorrhea of both breasts 01/18/2020   Light headedness 01/18/2020   Vitamin D insufficiency 01/18/2020   Hypoglycemia 01/18/2020   Menorrhagia with regular cycle 12/16/2019   Fracture of foot 12/16/2019   Blood sugar increased 12/16/2019   Encounter for screening mammogram for malignant neoplasm of breast 12/16/2019   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   History of ectopic pregnancy 12/16/2019   Ectopic pregnancy, tubal 12/03/2018   Iron deficiency anemia 03/02/2018    Past Surgical History:  Procedure Laterality Date   ABDOMINAL SURGERY  2004   gastric bypass   CESAREAN SECTION     DIAGNOSTIC LAPAROSCOPY WITH REMOVAL OF ECTOPIC PREGNANCY Right  12/03/2018   Procedure: DIAGNOSTIC LAPAROSCOPY WITH REMOVAL OF RIGHT ECTOPIC PREGNANCY AND RIGHT OVARY;  Surgeon: Christeen Douglas, MD;  Location: [facility] ORS;  Service: Gynecology;  Laterality: Right;   INNER EAR SURGERY     LAPAROSCOPIC GASTRIC BYPASS  2004   tubal rupture      OB History     Gravida  2   Para      Term      Preterm      AB      Living         SAB      IAB      Ectopic      Multiple      Live Births               Home Medications    Prior to Admission medications   Medication Sig Start Date End Date Taking? Authorizing Provider  hydrOXYzine (ATARAX) 25 MG tablet Take 1 tablet (25 mg total) by mouth every 6 (six) hours as needed for itching. 05/16/22  Yes Mickie Bail, NP  nystatin cream (MYCOSTATIN) Apply to affected area 2 times daily 05/16/22  Yes Mickie Bail, NP  predniSONE (STERAPRED UNI-PAK 21 TAB) 10 MG (21) TBPK tablet Take by mouth daily. As directed 05/16/22  Yes Mickie Bail, NP  buPROPion [facility] SR) 150 MG 12 hr tablet Take 1 tablet (150 mg total) by mouth 2 (two) times daily.  12/17/21   Jacky Kindle, FNP  cyanocobalamin (,VITAMIN B-12,) 1000 MCG/ML injection Inject 1 mL (1,000 mcg total) into the muscle every 30 (thirty) days. 12/27/21   Alinda Dooms, NP  FLUoxetine (PROZAC) 20 MG capsule Take 1 capsule (20 mg total) by mouth daily. 12/17/21   Jacky Kindle, FNP  SYRINGE-NEEDLE, DISP, 3 ML (BD SAFETYGLIDE SYRINGE/NEEDLE) 25G X 1" 3 ML MISC Use the needle for IM injection of b12 once a month. 12/27/21   Alinda Dooms, NP  traZODone (DESYREL) 50 MG tablet Take 0.5-1 tablets (25-50 mg total) by mouth at bedtime as needed for sleep. 12/17/21   Jacky Kindle, FNP    Family History Family History  Problem Relation Age of Onset   Arthritis Mother    Other Mother        back pain w stimulator ( internal )   Kidney disease Father    Intestinal polyp Father    Behavior problems Sister    Diabetes Maternal Grandfather    Kidney  disease Paternal Grandmother    Cancer Paternal Grandfather     Social History Social History   Tobacco Use   Smoking status: Never    Passive exposure: Yes   Smokeless tobacco: Never  Vaping Use   Vaping Use: Never used  Substance Use Topics   Alcohol use: Yes    Comment: socially beer   Drug use: No     Allergies   Clindamycin/lincomycin   Review of Systems Review of Systems  Constitutional:  Negative for chills and fever.  HENT:  Negative for ear pain and sore throat.   Respiratory:  Negative for cough and shortness of breath.   Cardiovascular:  Negative for chest pain and palpitations.  Gastrointestinal:  Negative for diarrhea and vomiting.  Skin:  Positive for rash.  All other systems reviewed and are negative.    Physical Exam Triage Vital Signs ED Triage Vitals  Enc Vitals Group     BP      Pulse      Resp      Temp      Temp src      SpO2      Weight      Height      Head Circumference      Peak Flow      Pain Score      Pain Loc      Pain Edu?      Excl. in GC?    No data found.  Updated Vital Signs BP 128/86   Pulse 62   Temp 97.9 F (36.6 C)   Resp 18   Ht 5\' 5"  (1.651 m)   Wt 190 lb (86.2 kg)   LMP 05/05/2022   SpO2 98%   BMI 31.62 kg/m   Visual Acuity Right Eye Distance:   Left Eye Distance:   Bilateral Distance:    Right Eye Near:   Left Eye Near:    Bilateral Near:     Physical Exam Vitals and nursing note reviewed.  Constitutional:      General: She is not in acute distress.    Appearance: She is well-developed. She is obese. She is not ill-appearing.  HENT:     Mouth/Throat:     Mouth: Mucous membranes are moist.  Cardiovascular:     Rate and Rhythm: Normal rate and regular rhythm.     Heart sounds: Normal heart sounds.  Pulmonary:     Effort: Pulmonary  effort is normal. No respiratory distress.     Breath sounds: Normal breath sounds.  Musculoskeletal:     Cervical back: Neck supple.  Skin:    General:  Skin is warm and dry.     Findings: Rash present.     Comments: Erythematous rash mid back, under breasts, and in bilateral groin.   Neurological:     Mental Status: She is alert.  Psychiatric:        Mood and Affect: Mood normal.        Behavior: Behavior normal.      UC Treatments / Results  Labs (all labs ordered are listed, but only abnormal results are displayed) Labs Reviewed - No data to display  EKG   Radiology No results found.  Procedures Procedures (including critical care time)  Medications Ordered in UC Medications - No data to display  Initial Impression / Assessment and Plan / UC Course  I have reviewed the triage vital signs and the nursing notes.  Pertinent labs & imaging results that were available during my care of the patient were reviewed by me and considered in my medical decision making (see chart for details).    Rash.  Treating with nystatin cream as rash appears to be primarily in the skin folds.  Also treating with prednisone taper and hydroxyzine.  Precautions for drowsiness with hydroxyzine discussed.  Instructed patient to follow-up with her PCP on Monday.  Education provided on rash.  Patient agrees to plan of care.     Final Clinical Impressions(s) / UC Diagnoses   Final diagnoses:  Rash     Discharge Instructions      Use the Nystatin cream as directed.  Take the prednisone and hydroxyzine as directed.  Do not drive, operate machinery, drink alcohol, or perform dangerous activities while taking hydroxyzine as it may cause drowsiness.  Follow up with your primary care provider on Monday.        ED Prescriptions     Medication Sig Dispense Auth. Provider   hydrOXYzine (ATARAX) 25 MG tablet Take 1 tablet (25 mg total) by mouth every 6 (six) hours as needed for itching. 12 tablet Sharion Balloon, NP   predniSONE (STERAPRED UNI-PAK 21 TAB) 10 MG (21) TBPK tablet Take by mouth daily. As directed 21 tablet Sharion Balloon, NP    nystatin cream (MYCOSTATIN) Apply to affected area 2 times daily 30 g Sharion Balloon, NP      PDMP not reviewed this encounter.   Sharion Balloon, NP 05/16/22 [id number]

## 2022-05-16 NOTE — ED Triage Notes (Signed)
Patient to Urgent Care with complaints of rash present to entire body, appeared on Saturday but has progressed. Describes rash as itchy and stings, has been applying a cream with little relief.  Reports she has not used any new products/ new foods. Reports recurrent rashes over the last year with no known cause.

## 2022-05-19 ENCOUNTER — Inpatient Hospital Stay: Payer: BLUE CROSS/BLUE SHIELD | Attending: Nurse Practitioner

## 2022-05-22 ENCOUNTER — Telehealth: Payer: Self-pay | Admitting: Oncology

## 2022-05-22 ENCOUNTER — Inpatient Hospital Stay: Payer: BLUE CROSS/BLUE SHIELD | Admitting: Medical Oncology

## 2022-05-22 NOTE — Telephone Encounter (Signed)
Per Chat:   Patient needs to r/s her appt. Please schedule labs with MyChart visit 2-3 days later. Please notify patient of appts. Thanks   Appointment scheduled and patient notified

## 2022-06-30 ENCOUNTER — Inpatient Hospital Stay: Payer: BLUE CROSS/BLUE SHIELD | Attending: Nurse Practitioner

## 2022-07-02 ENCOUNTER — Telehealth: Payer: Self-pay | Admitting: Medical Oncology

## 2022-07-02 ENCOUNTER — Inpatient Hospital Stay: Payer: BLUE CROSS/BLUE SHIELD | Admitting: Medical Oncology

## 2022-07-02 NOTE — Telephone Encounter (Signed)
Per chat- Patient did not get labs done. Tried calling no answer. Please r/s video visit with labs 2 prior. Thanks   Called patient to r/s left v/m for her to call back.

## 2022-07-03 ENCOUNTER — Emergency Department: Payer: BLUE CROSS/BLUE SHIELD

## 2022-07-03 ENCOUNTER — Other Ambulatory Visit: Payer: Self-pay

## 2022-07-03 ENCOUNTER — Emergency Department
Admission: EM | Admit: 2022-07-03 | Discharge: 2022-07-03 | Disposition: A | Discharge status: Home / Self Care | Payer: BLUE CROSS/BLUE SHIELD | Attending: Emergency Medicine | Admitting: Emergency Medicine

## 2022-07-03 ENCOUNTER — Encounter: Payer: Self-pay | Admitting: *Deleted

## 2022-07-03 DIAGNOSIS — R110 Nausea: Secondary | ICD-10-CM | POA: Insufficient documentation

## 2022-07-03 DIAGNOSIS — R197 Diarrhea, unspecified: Secondary | ICD-10-CM | POA: Insufficient documentation

## 2022-07-03 DIAGNOSIS — R0789 Other chest pain: Secondary | ICD-10-CM | POA: Insufficient documentation

## 2022-07-03 DIAGNOSIS — R21 Rash and other nonspecific skin eruption: Secondary | ICD-10-CM | POA: Diagnosis not present

## 2022-07-03 DIAGNOSIS — R1011 Right upper quadrant pain: Secondary | ICD-10-CM | POA: Insufficient documentation

## 2022-07-03 LAB — CBC
HCT: 34.4 % — ABNORMAL LOW (ref 36.0–46.0)
Hemoglobin: 10.9 g/dL — ABNORMAL LOW (ref 12.0–15.0)
MCH: 28.7 pg (ref 26.0–34.0)
MCHC: 31.7 g/dL (ref 30.0–36.0)
MCV: 90.5 fL (ref 80.0–100.0)
Platelets: 282 10*3/uL (ref 150–400)
RBC: 3.8 MIL/uL — ABNORMAL LOW (ref 3.87–5.11)
RDW: 13.5 % (ref 11.5–15.5)
WBC: 8.1 10*3/uL (ref 4.0–10.5)
nRBC: 0 % (ref 0.0–0.2)

## 2022-07-03 LAB — COMPREHENSIVE METABOLIC PANEL
ALT: 14 U/L (ref 0–44)
AST: 20 U/L (ref 15–41)
Albumin: 3.6 g/dL (ref 3.5–5.0)
Alkaline Phosphatase: 83 U/L (ref 38–126)
Anion gap: 4 — ABNORMAL LOW (ref 5–15)
BUN: 11 mg/dL (ref 6–20)
CO2: 24 mmol/L (ref 22–32)
Calcium: 8.7 mg/dL — ABNORMAL LOW (ref 8.9–10.3)
Chloride: 111 mmol/L (ref 98–111)
Creatinine, Ser: 0.71 mg/dL (ref 0.44–1.00)
GFR, Estimated: >60 mL/min (ref >60)
Glucose, Bld: 85 mg/dL (ref 70–99)
Potassium: 3.5 mmol/L (ref 3.5–5.1)
Sodium: 139 mmol/L (ref 135–145)
Total Bilirubin: 0.6 mg/dL (ref 0.3–1.2)
Total Protein: 6.6 g/dL (ref 6.5–8.1)

## 2022-07-03 LAB — LIPASE, BLOOD: Lipase: 33 U/L (ref 11–51)

## 2022-07-03 LAB — URINALYSIS, ROUTINE W REFLEX MICROSCOPIC
Bilirubin Urine: NEGATIVE
Glucose, UA: NEGATIVE mg/dL
Hgb urine dipstick: NEGATIVE
Ketones, ur: NEGATIVE mg/dL
Leukocytes,Ua: NEGATIVE
Nitrite: NEGATIVE
Protein, ur: NEGATIVE mg/dL
Specific Gravity, Urine: 1.017 (ref 1.005–1.030)
pH: 6 (ref 5.0–8.0)

## 2022-07-03 LAB — POC URINE PREG, ED: Preg Test, Ur: NEGATIVE

## 2022-07-03 MED ORDER — CYCLOBENZAPRINE HCL 10 MG PO TABS: 10.0000 mg | ORAL_TABLET | Freq: Three times a day (TID) | ORAL | 0 refills | Status: DC | PRN

## 2022-07-03 MED ORDER — IBUPROFEN 600 MG PO TABS: 600.0000 mg | ORAL_TABLET | Freq: Four times a day (QID) | ORAL | 0 refills | Status: DC | PRN

## 2022-07-03 NOTE — ED Provider Notes (Signed)
Patient signed out to me by Dr. Marisa Severin pending RUQ ultrasound for evaluation of atypical right-sided costal margin discomfort.  This ultrasound does not show any evidence of clear gallstones or cholecystitis.  Will discharge per original plan of care.   Delton Prairie, MD 07/03/22 2329

## 2022-07-03 NOTE — ED Notes (Signed)
Poct pregnancy Negative 

## 2022-07-03 NOTE — ED Notes (Signed)
E-signature pad unavailable - Pt verbalized understanding of D/C information - no additional concerns at this time.  

## 2022-07-03 NOTE — ED Provider Notes (Signed)
[facility] Provider Note    Event Date/Time   First MD Initiated Contact with Patient 07/03/22 2145     (approximate)   History   Abdominal Pain   HPI  Rebecca Lang is a 46 y.o. female with history of anemia, anxiety, back pain, hypertrophic cardiomyopathy, and status post Roux-en-Y gastric bypass who presents with right upper quadrant abdominal/right lower chest wall area pain for approximately the last week.  The patient states that she is often lifting and moving things at work and thought that it might be related to her activity at work.  The pain has been relieved temporarily with Tylenol and ibuprofen but then comes back.  It is somewhat worse after she eats or with certain movements or positions.  A couple times after eating she has had some nausea but no vomiting.  She has also had diarrhea today but not previously.  She denies any fever or chills.  She has no shortness of breath or any dizziness or lightheadedness.  She denies any prior history of this pain.  I reviewed the past medical records.  The patient's most recent encounter was on 11/3 at urgent care for a rash.  She has no recent ED visits or admissions.   Physical Exam   Triage Vital Signs: ED Triage Vitals  Enc Vitals Group     BP 07/03/22 2037 (!) 162/81     Pulse Rate 07/03/22 2037 (!) 53     Resp 07/03/22 2037 20     Temp 07/03/22 2037 98.3 F (36.8 C)     Temp Source 07/03/22 2037 Oral     SpO2 07/03/22 2037 98 %     Weight 07/03/22 2034 200 lb (90.7 kg)     Height 07/03/22 2034 5\' 5"  (1.651 m)     Head Circumference --      Peak Flow --      Pain Score 07/03/22 2034 10     Pain Loc --      Pain Edu? --      Excl. in GC? --     Most recent vital signs: Vitals:   07/03/22 2037 07/03/22 2332  BP: (!) 162/81 (!) 156/80  Pulse: (!) 53 61  Resp: 20 18  Temp: 98.3 F (36.8 C)   SpO2: 98% 100%     General: Awake, no distress.  CV:  Good peripheral perfusion.   Resp:  Normal effort.  Lungs CTAB. Abd:  Soft and nontender.  No distention.  Other:  Mild right lower rib border area tenderness.   ED Results / Procedures / Treatments   Labs (all labs ordered are listed, but only abnormal results are displayed) Labs Reviewed  COMPREHENSIVE METABOLIC PANEL - Abnormal; Notable for the following components:      Result Value   Calcium 8.7 (*)    Anion gap 4 (*)    All other components within normal limits  CBC - Abnormal; Notable for the following components:   RBC 3.80 (*)    Hemoglobin 10.9 (*)    HCT 34.4 (*)    All other components within normal limits  URINALYSIS, ROUTINE W REFLEX MICROSCOPIC - Abnormal; Notable for the following components:   Color, Urine YELLOW (*)    APPearance CLEAR (*)    All other components within normal limits  LIPASE, BLOOD  POC URINE PREG, ED     EKG  ED ECG REPORT I, 07/05/22, the attending physician, personally viewed and interpreted  this ECG.  Date: 07/03/2022 EKG Time: 2120 Rate: 49 Rhythm: normal sinus rhythm QRS Axis: normal Intervals: normal ST/T Wave abnormalities: normal Narrative Interpretation: no evidence of acute ischemia    RADIOLOGY  Chest x-ray: I independently viewed and interpreted the images; there is no focal consolidation or edema.   US abdomen RUQ: Pending  PROCEDURES:  Critical Care performed: No  Procedures   MEDICATIONS ORDERED IN ED: Medications - No data to display   IMPRESSION / MDM / ASSESSMENT AND PLAN / ED COURSE  I reviewed the triage vital signs and the nursing notes.  46 year old female with PMH as noted above presents with right lower chest wall/right upper abdominal pain for the last week sometimes associated with certain positions or movements but also sometimes exacerbated by eating.  Exam reveals a well-appearing patient with normal vital signs.  She has mild tenderness mainly to the right lower rib border but not the abdomen  itself.  Lab workup is overall reassuring with no leukocytosis, normal LFTs and lipase and normal bilirubin.  Differential diagnosis includes, but is not limited to, musculoskeletal chest wall pain, radiculopathy, less likely biliary colic or cholecystitis.  We will obtain chest x-ray, right upper quadrant ultrasound, and reassess.  Patient's presentation is most consistent with acute presentation with potential threat to life or bodily function.  ----------------------------------------- 11:20 PM on 07/03/2022 -----------------------------------------   I signed the patient out to the oncoming ED physician Dr. Katrinka Blazing pending ultrasound results.    FINAL CLINICAL IMPRESSION(S) / ED DIAGNOSES   Final diagnoses:  Right-sided chest wall pain     Rx / DC Orders   ED Discharge Orders          Ordered    ibuprofen (ADVIL) 600 MG tablet  Every 6 hours PRN        07/03/22 2325    cyclobenzaprine (FLEXERIL) 10 MG tablet  3 times daily PRN        07/03/22 2325             Note:  This document was prepared using Dragon voice recognition software and may include unintentional dictation errors.    Dionne Bucy, MD 07/03/22 [phone]

## 2022-07-03 NOTE — ED Triage Notes (Signed)
Pt reports ruq pain.  Pt has nausea.  No vomiting.  Diarrhea x 2 today.   Pain radiates into back.  Pain for 5 days.  Pt alert  speech clear.

## 2022-07-03 NOTE — Discharge Instructions (Addendum)
Please take Tylenol and ibuprofen/Advil for your pain.  It is safe to take them together, or to alternate them every few hours.  Take up to 1000mg of Tylenol at a time, up to 4 times per day.  Do not take more than 4000 mg of Tylenol in 24 hours.  For ibuprofen, take 400-600 mg, 3 - 4 times per day.  

## 2022-08-12 ENCOUNTER — Other Ambulatory Visit: Payer: Self-pay | Admitting: Family Medicine

## 2023-02-09 ENCOUNTER — Emergency Department
Admission: EM | Admit: 2023-02-09 | Discharge: 2023-02-09 | Disposition: A | Discharge status: Home / Self Care | Payer: BLUE CROSS/BLUE SHIELD | Attending: Emergency Medicine | Admitting: Emergency Medicine

## 2023-02-09 ENCOUNTER — Other Ambulatory Visit: Payer: Self-pay

## 2023-02-09 ENCOUNTER — Emergency Department: Payer: BLUE CROSS/BLUE SHIELD

## 2023-02-09 ENCOUNTER — Encounter: Payer: Self-pay | Admitting: Emergency Medicine

## 2023-02-09 ENCOUNTER — Encounter: Payer: Self-pay | Admitting: Oncology

## 2023-02-09 DIAGNOSIS — R5383 Other fatigue: Secondary | ICD-10-CM | POA: Diagnosis not present

## 2023-02-09 DIAGNOSIS — R202 Paresthesia of skin: Secondary | ICD-10-CM | POA: Diagnosis not present

## 2023-02-09 DIAGNOSIS — R531 Weakness: Secondary | ICD-10-CM | POA: Diagnosis present

## 2023-02-09 LAB — CBC
HCT: 33.2 % — ABNORMAL LOW (ref 36.0–46.0)
Hemoglobin: 10.6 g/dL — ABNORMAL LOW (ref 12.0–15.0)
MCH: 26.3 pg (ref 26.0–34.0)
MCHC: 31.9 g/dL (ref 30.0–36.0)
MCV: 82.4 fL (ref 80.0–100.0)
Platelets: 313 10*3/uL (ref 150–400)
RBC: 4.03 MIL/uL (ref 3.87–5.11)
RDW: 15.7 % — ABNORMAL HIGH (ref 11.5–15.5)
WBC: 7.5 10*3/uL (ref 4.0–10.5)
nRBC: 0 % (ref 0.0–0.2)

## 2023-02-09 LAB — URINALYSIS, ROUTINE W REFLEX MICROSCOPIC
Bilirubin Urine: NEGATIVE
Glucose, UA: 50 mg/dL — AB
Hgb urine dipstick: NEGATIVE
Ketones, ur: NEGATIVE mg/dL
Leukocytes,Ua: NEGATIVE
Nitrite: NEGATIVE
Protein, ur: NEGATIVE mg/dL
Specific Gravity, Urine: 1.031 — ABNORMAL HIGH (ref 1.005–1.030)
pH: 5 (ref 5.0–8.0)

## 2023-02-09 LAB — HEPATIC FUNCTION PANEL
ALT: 13 U/L (ref 0–44)
AST: 19 U/L (ref 15–41)
Albumin: 3.6 g/dL (ref 3.5–5.0)
Alkaline Phosphatase: 81 U/L (ref 38–126)
Bilirubin, Direct: <0.1 mg/dL (ref 0.0–0.2)
Total Bilirubin: 0.3 mg/dL (ref 0.3–1.2)
Total Protein: 6.7 g/dL (ref 6.5–8.1)

## 2023-02-09 LAB — BASIC METABOLIC PANEL
Anion gap: 8 (ref 5–15)
BUN: 13 mg/dL (ref 6–20)
CO2: 20 mmol/L — ABNORMAL LOW (ref 22–32)
Calcium: 8.5 mg/dL — ABNORMAL LOW (ref 8.9–10.3)
Chloride: 110 mmol/L (ref 98–111)
Creatinine, Ser: 0.69 mg/dL (ref 0.44–1.00)
GFR, Estimated: >60 mL/min (ref >60)
Glucose, Bld: 144 mg/dL — ABNORMAL HIGH (ref 70–99)
Potassium: 3.7 mmol/L (ref 3.5–5.1)
Sodium: 138 mmol/L (ref 135–145)

## 2023-02-09 LAB — TSH: TSH: 1.383 u[IU]/mL (ref 0.350–4.500)

## 2023-02-09 LAB — PREGNANCY, URINE: Preg Test, Ur: NEGATIVE

## 2023-02-09 NOTE — Discharge Instructions (Signed)
Please return to the ER for any symptom that changes, worsens, or if you have new symptoms of concern.

## 2023-02-09 NOTE — ED Triage Notes (Signed)
Pt here with weakness and lightheadedness. Pt also states her tongue is also numb. Pt states the right side of her head is also tingling. Pt denies trouble walking or slurred speech. Pt

## 2023-02-09 NOTE — ED Provider Notes (Signed)
[facility] Provider Note    Event Date/Time   First MD Initiated Contact with Patient 02/09/23 1706     (approximate)   History   Weakness   HPI  MEOSHA KOVACK is a 47 y.o. female  with history of anemia, hypertrophic cardiomyopathy, depression, anxiety and as listed in EMR presents to the emergency department for treatment and evaluation of strange sensation on the right side of her forehead that lasted a few seconds and happened 4-5 times today. It was like something was crawling on the underside of her forehead. No weakness, headache, blurred vision, confusion, or nausea. No recent URI symptoms. No medication changes. She also reports that she has had significant fatigue for the past 3 weeks, which is very unusual for her. While on her way to the hospital, she got something to eat. She couldn't really feel the food on her tongue. It wasn't numb like at the dentist and it was the whole tongue. That sensation has passed.Marland Kitchen      Physical Exam   Triage Vital Signs: ED Triage Vitals  Encounter Vitals Group     BP 02/09/23 1551 (!) 152/89     Systolic BP Percentile --      Diastolic BP Percentile --      Pulse Rate 02/09/23 1551 81     Resp 02/09/23 1551 19     Temp 02/09/23 1551 98.4 F (36.9 C)     Temp src --      SpO2 02/09/23 1551 100 %     Weight 02/09/23 1552 199 lb 15.3 oz (90.7 kg)     Height 02/09/23 1552 5\' 5"  (1.651 m)     Head Circumference --      Peak Flow --      Pain Score 02/09/23 1551 0     Pain Loc --      Pain Education --      Exclude from Growth Chart --     Most recent vital signs: Vitals:   02/09/23 1551  BP: (!) 152/89  Pulse: 81  Resp: 19  Temp: 98.4 F (36.9 C)  SpO2: 100%    General: Awake, no distress.  CV:  Good peripheral perfusion.  Resp:  Normal effort.  Abd:  No distention.  Other:  Neuro exam unremarkable.   ED Results / Procedures / Treatments   Labs (all labs ordered are listed, but only  abnormal results are displayed) Labs Reviewed  BASIC METABOLIC PANEL - Abnormal; Notable for the following components:      Result Value   CO2 20 (*)    Glucose, Bld 144 (*)    Calcium 8.5 (*)    All other components within normal limits  CBC - Abnormal; Notable for the following components:   Hemoglobin 10.6 (*)    HCT 33.2 (*)    RDW 15.7 (*)    All other components within normal limits  URINALYSIS, ROUTINE W REFLEX MICROSCOPIC - Abnormal; Notable for the following components:   Color, Urine YELLOW (*)    APPearance HAZY (*)    Specific Gravity, Urine 1.031 (*)    Glucose, UA 50 (*)    All other components within normal limits  HEPATIC FUNCTION PANEL  TSH  PREGNANCY, URINE     EKG  Sinus rhythm, rate of 74, occasional PVC    RADIOLOGY  Image and radiology report reviewed and interpreted by me. Radiology report consistent with the same.  CT head negative for  acute concerns  PROCEDURES:  Critical Care performed: No  Procedures   MEDICATIONS ORDERED IN ED:  Medications - No data to display   IMPRESSION / MDM / ASSESSMENT AND PLAN / ED COURSE   I have reviewed the triage note.  Differential diagnosis includes, but is not limited to, atypical migraine, tia/cva, viral syndrome, hypothyroid, Lyme disease  Patient's presentation is most consistent with acute presentation with potential threat to life or bodily function.  47 year old female presenting to the emergency department for treatment and evaluation of symptoms as described in the HPI.  CT and lab studies are all reassuring.  EKG shows occasional PVCs but otherwise is normal.  Case discussed with ED attending who also examined the patient.  No indication for further imaging at this time.  Patient will be discharged home with advised to call and schedule follow-up appointment with her primary care provider especially if her symptoms do not resolve over the next 24 hours.  ER return precautions as well.       FINAL CLINICAL IMPRESSION(S) / ED DIAGNOSES   Final diagnoses:  Paresthesia  Fatigue, unspecified type     Rx / DC Orders   ED Discharge Orders     None        Note:  This document was prepared using Dragon voice recognition software and may include unintentional dictation errors.   Chinita Pester, FNP 02/09/23 1900    Jene Every, MD 02/09/23 Norberta Keens

## 2023-04-02 ENCOUNTER — Encounter: Payer: Self-pay | Admitting: Oncology

## 2023-04-02 ENCOUNTER — Telehealth: Payer: BLUE CROSS/BLUE SHIELD | Admitting: Physician Assistant

## 2023-04-02 DIAGNOSIS — L237 Allergic contact dermatitis due to plants, except food: Secondary | ICD-10-CM

## 2023-04-02 MED ORDER — TRIAMCINOLONE ACETONIDE 0.1 % EX CREA: 1.0000 | TOPICAL_CREAM | Freq: Two times a day (BID) | CUTANEOUS | 0 refills | Status: DC

## 2023-04-02 MED ORDER — PREDNISONE 10 MG PO TABS: ORAL_TABLET | ORAL | 0 refills | Status: AC

## 2023-04-02 NOTE — Patient Instructions (Signed)
Rebecca Lang, thank you for joining Piedad Climes, PA-C for today's virtual visit.  While this provider is not your primary care provider (PCP), if your PCP is located in our provider database this encounter information will be shared with them immediately following your visit.   A [facility] MyChart account gives you access to today's visit and all your visits, tests, and labs performed at [facility] " click here if you don't have a [facility] MyChart account or go to [email or web address]  Consent: (Patient) Rebecca Lang provided verbal consent for this virtual visit at the beginning of the encounter.  Current Medications:  Current Outpatient Medications:    buPROPion (WELLBUTRIN SR) 150 MG 12 hr tablet, Take 1 tablet (150 mg total) by mouth 2 (two) times daily., Disp: 180 tablet, Rfl: 1   cyanocobalamin (,VITAMIN B-12,) 1000 MCG/ML injection, Inject 1 mL (1,000 mcg total) into the muscle every 30 (thirty) days., Disp: 1 mL, Rfl: 5   cyclobenzaprine (FLEXERIL) 10 MG tablet, Take 1 tablet (10 mg total) by mouth 3 (three) times daily as needed for muscle spasms., Disp: 15 tablet, Rfl: 0   FLUoxetine (PROZAC) 20 MG capsule, Take 1 capsule (20 mg total) by mouth daily., Disp: 90 capsule, Rfl: 3   hydrOXYzine (ATARAX) 25 MG tablet, Take 1 tablet (25 mg total) by mouth every 6 (six) hours as needed for itching., Disp: 12 tablet, Rfl: 0   ibuprofen (ADVIL) 600 MG tablet, Take 1 tablet (600 mg total) by mouth every 6 (six) hours as needed., Disp: 30 tablet, Rfl: 0   nystatin cream (MYCOSTATIN), Apply to affected area 2 times daily, Disp: 30 g, Rfl: 0   predniSONE (STERAPRED UNI-PAK 21 TAB) 10 MG (21) TBPK tablet, Take by mouth daily. As directed, Disp: 21 tablet, Rfl: 0   SYRINGE-NEEDLE, DISP, 3 ML (BD SAFETYGLIDE SYRINGE/NEEDLE) 25G X 1" 3 ML MISC, Use the needle for IM injection of b12 once a month., Disp: 6 each, Rfl: 0   traZODone (DESYREL) 50 MG tablet, Take  0.5-1 tablets (25-50 mg total) by mouth at bedtime as needed for sleep., Disp: 30 tablet, Rfl: 3   Medications ordered in this encounter:  No orders of the defined types were placed in this encounter.    *If you need refills on other medications prior to your next appointment, please contact your pharmacy*  Follow-Up: Call back or seek an in-person evaluation if the symptoms worsen or if the condition fails to improve as anticipated.   Virtual Care [phone]  Other Instructions Poison Ivy Dermatitis Poison ivy dermatitis is redness and soreness of the skin caused by chemicals in the leaves of the poison ivy plant. You may have very bad itching, swelling, a rash, and blisters. What are the causes? Touching a poison ivy plant. Touching something that has the chemical on it. This may include animals or objects that have come in contact with the plant. What increases the risk? Going outdoors often in wooded or [place] areas. Going outdoors without wearing protective clothing, such as closed shoes, long pants, and a long-sleeved shirt. What are the signs or symptoms?  Skin redness. Very bad itching. A rash that often includes bumps and blisters. The rash usually appears 48 hours after exposure, if you have had it before. If this is the first time you have it, the rash may not appear until a week after exposure. Swelling. This may occur if the reaction is very bad. Symptoms usually last for  1-2 weeks. The first time you get this condition, symptoms may last 3-4 weeks. How is this treated? This condition may be treated with: Hydrocortisone cream or calamine lotion to relieve itching. Oatmeal baths to soothe the skin. Medicines, such as over-the-counter antihistamine tablets. Oral steroid medicine for very bad reactions. Follow these instructions at home: Medicines Take or apply over-the-counter and prescription medicines only as told by your doctor. Use hydrocortisone  cream or calamine lotion as needed to help with itching. General instructions Do not scratch or rub your skin. Put a cold, wet cloth (cold compress) on the affected areas or take baths in cool water. This will help with itching. Avoid hot baths and showers. Take oatmeal baths as needed. Use colloidal oatmeal. You can get this at a pharmacy or grocery store. Follow the instructions on the package. While you have the rash, wash your clothes right after you wear them. Check the affected area every day for signs of infection. Check for: More redness, swelling, or pain. Fluid or blood. Warmth. Pus or a bad smell. Keep all follow-up visits. Your doctor may want to see how your skin is doing with treatment. How is this prevented?  Know what poison ivy looks like, so you can avoid it. This plant has three leaves with flowering branches on a single stem. The leaves are glossy. The leaves have uneven edges that come to a point. If you touch poison ivy, wash your skin with soap and water right away. Be sure to wash under your fingernails. When hiking or camping, wear long pants, a long-sleeved shirt, long socks, and hiking boots. You can also use a lotion on your skin that helps to prevent contact with poison ivy. If you think that your clothes or outdoor gear came in contact with poison ivy, rinse them off with a garden hose before you bring them inside your house. When doing yard work or gardening, wear gloves, long sleeves, long pants, and boots. Wash your garden tools and gloves if they come in contact with poison ivy. If you think that your pet has come into contact with poison ivy, wash them with pet shampoo and water. Make sure to wear gloves while washing your pet. Contact a doctor if: You have open sores in the rash area. You have any signs of infection. You have redness that spreads past the rash area. You have a fever. You have a rash over a large area of your body. You have a rash on  your eyes, mouth, or genitals. Your rash does not get better after a few weeks. Get help right away if: Your face swells or your eyes swell shut. You have trouble breathing. You have trouble swallowing. These symptoms may be an emergency. Do not wait to see if the symptoms will go away. Get help right away. Call 911. This information is not intended to replace advice given to you by your health care provider. Make sure you discuss any questions you have with your health care provider. Document Revised: 11/28/2021 Document Reviewed: 11/28/2021 Elsevier Patient Education  2024 Elsevier Inc.    If you have been instructed to have an in-person evaluation today at a local Urgent Care facility, please use the link below. It will take you to a list of all of our available [facility], including address, phone number and hours of operation. Please do not delay care.  [facility]  If you or a family member do not have a primary  care provider, use the link below to schedule a visit and establish care. When you choose a Black primary care physician or advanced practice provider, you gain a long-term partner in health. Find a Primary Care Provider  Learn more about [facility] in-office and virtual care options:  - Get Care Now

## 2023-04-02 NOTE — Progress Notes (Signed)
Virtual Visit Consent   Rebecca Lang, you are scheduled for a virtual visit with a [facility] provider today. Just as with appointments in the office, your consent must be obtained to participate. Your consent will be active for this visit and any virtual visit you may have with one of our providers in the next 365 days. If you have a MyChart account, a copy of this consent can be sent to you electronically.  As this is a virtual visit, video technology does not allow for your provider to perform a traditional examination. This may limit your provider's ability to fully assess your condition. If your provider identifies any concerns that need to be evaluated in person or the need to arrange testing (such as labs, EKG, etc.), we will make arrangements to do so. Although advances in technology are sophisticated, we cannot ensure that it will always work on either your end or our end. If the connection with a video visit is poor, the visit may have to be switched to a telephone visit. With either a video or telephone visit, we are not always able to ensure that we have a secure connection.  By engaging in this virtual visit, you consent to the provision of healthcare and authorize for your insurance to be billed (if applicable) for the services provided during this visit. Depending on your insurance coverage, you may receive a charge related to this service.  I need to obtain your verbal consent now. Are you willing to proceed with your visit today? ASHLEN SHERO has provided verbal consent on 04/02/2023 for a virtual visit (video or telephone). Rebecca Lang, [place]  Date: 04/02/2023 4:34 PM  Virtual Visit via Video Note   I, Rebecca Lang, connected with  SUZANE CHALFIN  ([id number], 1975-07-23) on 04/02/23 at  4:30 PM EDT by a video-enabled telemedicine application and verified that I am speaking with the correct person using two identifiers.  Location: Patient: Virtual Visit  Location Patient: Home Provider: Virtual Visit Location Provider: Home Office   I discussed the limitations of evaluation and management by telemedicine and the availability of in person appointments. The patient expressed understanding and agreed to proceed.    History of Present Illness: Rebecca Lang is a 47 y.o. who identifies as a female who was assigned female at birth, and is being seen today for possible poison ivy dermatitis. Endorses symptoms starting about 2 days ago after exposure to poison ivy. Initially noting rash on arms bilaterally and now has spread over her body. Denies fevers, chills.   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   Elevated blood pressure reading without diagnosis of hypertension 12/17/2021   Bradycardia 03/20/2021   Fatigue 03/20/2021   Weight gain 03/20/2021   Galactorrhea of both breasts 01/18/2020   Light headedness 01/18/2020   Vitamin D insufficiency 01/18/2020   Hypoglycemia 01/18/2020   Menorrhagia with regular cycle 12/16/2019   Fracture of foot 12/16/2019   Blood sugar increased 12/16/2019   Encounter for screening mammogram for malignant neoplasm of breast 12/16/2019   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   History of ectopic pregnancy 12/16/2019   Ectopic pregnancy, tubal 12/03/2018   Iron deficiency anemia 03/02/2018    Allergies:  Allergies  Allergen Reactions   Clindamycin/Lincomycin  Rash   Medications:  Current Outpatient Medications:    predniSONE (DELTASONE) 10 MG tablet, Take 4 tablets (40 mg total) by mouth daily with breakfast for 4 days, THEN 3 tablets (30 mg total) daily with breakfast for 4 days, THEN 2 tablets (20 mg total) daily with breakfast for 3 days, THEN 1 tablet (10 mg total) daily with breakfast for 3  days., Disp: 37 tablet, Rfl: 0   triamcinolone cream (KENALOG) 0.1 %, Apply 1 Application topically 2 (two) times daily., Disp: 30 g, Rfl: 0  Observations/Objective: Patient is well-developed, well-nourished in no acute distress.  Resting comfortably at home.  Head is normocephalic, atraumatic.  No labored breathing. Speech is clear and coherent with logical content.  Patient is alert and oriented at baseline.  Erythematous papulovesicular rash noted of left forearm in linear distribution. Similar rash of right upper extremity. Scattered lesions of various parts of body, including upper chest, torso. Unable to visualize other areas of body.  Assessment and Plan: 1. Poison ivy dermatitis - predniSONE (DELTASONE) 10 MG tablet; Take 4 tablets (40 mg total) by mouth daily with breakfast for 4 days, THEN 3 tablets (30 mg total) daily with breakfast for 4 days, THEN 2 tablets (20 mg total) daily with breakfast for 3 days, THEN 1 tablet (10 mg total) daily with breakfast for 3 days.  Dispense: 37 tablet; Refill: 0 - triamcinolone cream (KENALOG) 0.1 %; Apply 1 Application topically 2 (two) times daily.  Dispense: 30 g; Refill: 0  Supportive measures and OTC medications reviewed. Start prednisone taper as directed. Topical triamcinolone per orders. If not resolving or any new/worsening symptoms despite treatment, she is to seek an in-person evaluation ASAP.  Follow Up Instructions: I discussed the assessment and treatment plan with the patient. The patient was provided an opportunity to ask questions and all were answered. The patient agreed with the plan and demonstrated an understanding of the instructions.  A copy of instructions were sent to the patient via MyChart unless otherwise noted below.   The patient was advised to call back or seek an in-person evaluation if the symptoms worsen or if the condition fails to improve as anticipated.  Time:  I spent 10 minutes with the patient via  telehealth technology discussing the above problems/concerns.    Rebecca Climes, PA-C

## 2023-10-24 ENCOUNTER — Emergency Department
Admission: EM | Admit: 2023-10-24 | Discharge: 2023-10-24 | Disposition: A | Discharge status: Home / Self Care | Attending: Emergency Medicine | Admitting: Emergency Medicine

## 2023-10-24 ENCOUNTER — Other Ambulatory Visit: Payer: Self-pay

## 2023-10-24 ENCOUNTER — Emergency Department

## 2023-10-24 ENCOUNTER — Encounter: Payer: Self-pay | Admitting: Oncology

## 2023-10-24 DIAGNOSIS — R911 Solitary pulmonary nodule: Secondary | ICD-10-CM | POA: Insufficient documentation

## 2023-10-24 DIAGNOSIS — R0789 Other chest pain: Secondary | ICD-10-CM | POA: Insufficient documentation

## 2023-10-24 LAB — BASIC METABOLIC PANEL WITH GFR
Anion gap: 6 (ref 5–15)
BUN: 13 mg/dL (ref 6–20)
CO2: 24 mmol/L (ref 22–32)
Calcium: 9.2 mg/dL (ref 8.9–10.3)
Chloride: 107 mmol/L (ref 98–111)
Creatinine, Ser: 0.52 mg/dL (ref 0.44–1.00)
GFR, Estimated: >60 mL/min (ref >60)
Glucose, Bld: 95 mg/dL (ref 70–99)
Potassium: 4.2 mmol/L (ref 3.5–5.1)
Sodium: 137 mmol/L (ref 135–145)

## 2023-10-24 LAB — CBC
HCT: 31.6 % — ABNORMAL LOW (ref 36.0–46.0)
Hemoglobin: 9.8 g/dL — ABNORMAL LOW (ref 12.0–15.0)
MCH: 23.6 pg — ABNORMAL LOW (ref 26.0–34.0)
MCHC: 31 g/dL (ref 30.0–36.0)
MCV: 76 fL — ABNORMAL LOW (ref 80.0–100.0)
Platelets: 358 10*3/uL (ref 150–400)
RBC: 4.16 MIL/uL (ref 3.87–5.11)
RDW: 17.5 % — ABNORMAL HIGH (ref 11.5–15.5)
WBC: 6.7 10*3/uL (ref 4.0–10.5)
nRBC: 0 % (ref 0.0–0.2)

## 2023-10-24 LAB — TROPONIN I (HIGH SENSITIVITY)
Troponin I (High Sensitivity): 14 ng/L (ref <18)
Troponin I (High Sensitivity): 15 ng/L (ref <18)

## 2023-10-24 MED ORDER — PANTOPRAZOLE SODIUM 40 MG PO TBEC
40.0000 mg | DELAYED_RELEASE_TABLET | Freq: Every day | ORAL | 1 refills | Status: DC
Start: 2023-10-24 — End: 2024-10-23

## 2023-10-24 MED ORDER — IOHEXOL 350 MG/ML SOLN
75.0000 mL | Freq: Once | INTRAVENOUS | Status: AC | PRN
  Administered 2023-10-24: 75 mL via INTRAVENOUS

## 2023-10-24 NOTE — ED Provider Notes (Addendum)
 [facility] Provider Note    Event Date/Time   First MD Initiated Contact with Patient 10/24/23 1017     (approximate)   History   Chest pain   HPI  Rebecca Lang is a 48 y.o. female with a history of iron deficiency anemia, Roux-en-Y gastric bypass who presents with complaints of chest discomfort in the left side of her chest, she reports it is intermittent, started yesterday.  She thought it may be related to acid reflux.  Went to urgent care and was referred to the emergency department.  Currently is asymptomatic.  No history of high blood pressure.  No ripping pain, no shortness of breath, no calf pain or swelling, no recent travel     Physical Exam   Triage Vital Signs: ED Triage Vitals  Encounter Vitals Group     BP 10/24/23 1022 139/64     Systolic BP Percentile --      Diastolic BP Percentile --      Pulse Rate 10/24/23 1022 (!) 58     Resp 10/24/23 1022 20     Temp --      Temp src --      SpO2 10/24/23 1022 100 %     Weight 10/24/23 1013 90.7 kg (199 lb 15.3 oz)     Height --      Head Circumference --      Peak Flow --      Pain Score 10/24/23 1013 0     Pain Loc --      Pain Education --      Exclude from Growth Chart --     Most recent vital signs: Vitals:   10/24/23 1022  BP: 139/64  Pulse: (!) 58  Resp: 20  SpO2: 100%     General: Awake, no distress.  CV:  Good peripheral perfusion.  Normal pulses in the upper extremities Resp:  Normal effort.  Abd:  No distention.  Other:  No calf pain or swelling   ED Results / Procedures / Treatments   Labs (all labs ordered are listed, but only abnormal results are displayed) Labs Reviewed  CBC - Abnormal; Notable for the following components:      Result Value   Hemoglobin 9.8 (*)    HCT 31.6 (*)    MCV 76.0 (*)    MCH 23.6 (*)    RDW 17.5 (*)    All other components within normal limits  BASIC METABOLIC PANEL WITH GFR  POC URINE PREG, ED  TROPONIN I (HIGH  SENSITIVITY)  TROPONIN I (HIGH SENSITIVITY)     EKG  ED ECG REPORT I, Bryson Carbine, the attending physician, personally viewed and interpreted this ECG.  Date: 10/24/2023  Rhythm: normal sinus rhythm QRS Axis: normal Intervals: normal ST/T Wave abnormalities: normal Narrative Interpretation: PVC noted    RADIOLOGY CT angiography without evidence of dissection per my evaluation, pending radiology read    PROCEDURES:  Critical Care performed:   Procedures   MEDICATIONS ORDERED IN ED: Medications  iohexol (OMNIPAQUE) 350 MG/ML injection 75 mL (75 mLs Intravenous Contrast Given 10/24/23 1223)     IMPRESSION / MDM / ASSESSMENT AND PLAN / ED COURSE  I reviewed the triage vital signs and the nursing notes. Patient's presentation is most consistent with acute presentation with potential threat to life or bodily function.  Patient presents with chest pain as detailed above, differential includes ACS, less likely angina, GERD/esophagitis.  No risk factors for dissection  but she does report occasional radiation to her back, PERC negative, doubt PE, no tachycardia  EKG overall reassuring, pending high sensitive troponin, chest x-ray, labs  Initial labs are reassuring, patient reports 2 episodes of chest pain rating to her back, will send for CT angiography to rule out aortic pathology   CT scan without PE or aortic pathology, 1.1 cm nodule which radiologist contacted me about to ensure follow-up.  I discussed with patient at length that she needs close follow-up for this and possible biopsy.  I have referred her to lung nodule clinic  She is feeling well at this time, no indication for admission, second troponin is normal.  Strict return precautions discussed, outpatient follow-up with cardiology, will start her on PPI      FINAL CLINICAL IMPRESSION(S) / ED DIAGNOSES   Final diagnoses:  Atypical chest pain  Lung nodule     Rx / DC Orders   ED Discharge Orders           Ordered    pantoprazole (PROTONIX) 40 MG tablet  Daily        10/24/23 1351    AMB  Referral to Pulmonary Nodule Clinic        10/24/23 1351    Ambulatory referral to Cardiology       Comments: If you have not heard from the Cardiology office within the next 72 hours please call [phone].   10/24/23 1351             Note:  This document was prepared using Dragon voice recognition software and may include unintentional dictation errors.   Bryson Carbine, MD 10/24/23 4132    Bryson Carbine, MD 10/24/23 [phone]

## 2023-10-24 NOTE — Discharge Instructions (Addendum)
 As we discussed it is important to follow-up with her pulmonary nodule clinic, I have made a referral  Please take the medication as prescribed, if symptoms worsen return to the emergency department, I have placed a cardiology referral as well

## 2023-10-24 NOTE — ED Triage Notes (Signed)
 C/o left sided chest pain, radiating down left arm and into left back x 1 day.  States pain started yesterday after eating something.  STates pain is intermittent.  Seen through Urgent Care this morning, who referred he to ED for concerns for PVC's on 12-lead.

## 2023-10-24 NOTE — ED Notes (Signed)
 Unable to print sunquest labels, will use chart labels.

## 2023-10-24 NOTE — ED Notes (Signed)
 EDP at bedside. Pt to ED for PVCs seen on EKG. Pt states had been having chest discomfort "spasms that come and go" but no pain at this time. Pain when it comes is to L side, down L arm and through to back. Skin dry.

## 2023-10-26 ENCOUNTER — Telehealth: Payer: Self-pay | Admitting: Oncology

## 2023-10-26 ENCOUNTER — Other Ambulatory Visit: Payer: Self-pay

## 2023-10-26 ENCOUNTER — Ambulatory Visit: Payer: Self-pay

## 2023-10-26 DIAGNOSIS — D509 Iron deficiency anemia, unspecified: Secondary | ICD-10-CM

## 2023-10-26 NOTE — Telephone Encounter (Signed)
 Patient called to request appointments due to being seen at ER over the weekend. Please advise scheduling  Please call patient at [phone]

## 2023-10-26 NOTE — Telephone Encounter (Signed)
 Copied from CRM [id number]. Topic: Clinical - Red Word Triage >> Oct 26, 2023  8:26 AM Juliana Ocean wrote: Red Word that prompted transfer to Nurse Triage: pt went to ED on Sat w/ chhest pain.  Pt has been referred to pulmomary.  Pt had a nodule found on her lung   Chief Complaint: Follow up with Pulmonary reference nodule Symptoms: n/a Frequency: n/a Pertinent Negatives: Patient denies chest pain, difficulty breathing, nausea, vomiting, diarrhea, fever. Disposition: [] ED /[] Urgent Care (no appt availability in office) / [x] Appointment(In office/virtual)/ []  [facility]/ [] Home Care/ [] Refused Recommended Disposition /[] [facility]/ []  Follow-up with PCP Additional Notes: Patient states that she went to Urgent Care two days ago for chest pain, thinking it was heartburn because it started after she ate.  Patient states that the Urgent Care saw PVCs and sent her to the Emergency Room.  Patient states that at the Emergency Room they found a nodule and wanted her to follow up with Pulmonary for this. 1.1 cm nodule in the right lower lobe. Patient denies any new symptoms and states that she is just calling to schedule this pulmonary appointment. Patient denies any chest pain at this time. Patient did not want to be triaged and states that she has not had any chest pain since leaving the ER. Patient denies any chest pain, difficulty breathing, nausea, vomiting, diarrhea, fever. Appointment made for 10/29/2023 at 1 pm with Coralie Derrick. MD at the [facility] Pulmonary office. Patient is also advised that if anything changes or worsens to go to the Emergency Room. Patient verbalized understanding.  Answer Assessment - Initial Assessment Questions 1. REASON FOR CALL or QUESTION: "What is your reason for calling today?" or "How can I best help you?" or "What question do you have that I can help answer?"     Referred for nodule found while in Emergency Room two days ago.  Protocols used:  Information Only Call - No Triage-A-AH

## 2023-10-26 NOTE — Telephone Encounter (Signed)
 Appt date and time are good.   Nothing further needed.

## 2023-10-28 ENCOUNTER — Inpatient Hospital Stay: Attending: Oncology

## 2023-10-28 ENCOUNTER — Encounter: Payer: Self-pay | Admitting: Oncology

## 2023-10-28 DIAGNOSIS — E538 Deficiency of other specified B group vitamins: Secondary | ICD-10-CM | POA: Insufficient documentation

## 2023-10-28 DIAGNOSIS — R911 Solitary pulmonary nodule: Secondary | ICD-10-CM | POA: Insufficient documentation

## 2023-10-28 DIAGNOSIS — D509 Iron deficiency anemia, unspecified: Secondary | ICD-10-CM | POA: Insufficient documentation

## 2023-10-28 LAB — CBC WITH DIFFERENTIAL (CANCER CENTER ONLY)
Abs Immature Granulocytes: 0.02 10*3/uL (ref 0.00–0.07)
Basophils Absolute: 0 10*3/uL (ref 0.0–0.1)
Basophils Relative: 0 %
Eosinophils Absolute: 0.1 10*3/uL (ref 0.0–0.5)
Eosinophils Relative: 1 %
HCT: 32.5 % — ABNORMAL LOW (ref 36.0–46.0)
Hemoglobin: 10.2 g/dL — ABNORMAL LOW (ref 12.0–15.0)
Immature Granulocytes: 0 %
Lymphocytes Relative: 10 %
Lymphs Abs: 0.6 10*3/uL — ABNORMAL LOW (ref 0.7–4.0)
MCH: 23.7 pg — ABNORMAL LOW (ref 26.0–34.0)
MCHC: 31.4 g/dL (ref 30.0–36.0)
MCV: 75.6 fL — ABNORMAL LOW (ref 80.0–100.0)
Monocytes Absolute: 0.3 10*3/uL (ref 0.1–1.0)
Monocytes Relative: 5 %
Neutro Abs: 5.2 10*3/uL (ref 1.7–7.7)
Neutrophils Relative %: 84 %
Platelet Count: 362 10*3/uL (ref 150–400)
RBC: 4.3 MIL/uL (ref 3.87–5.11)
RDW: 17.2 % — ABNORMAL HIGH (ref 11.5–15.5)
WBC Count: 6.2 10*3/uL (ref 4.0–10.5)
nRBC: 0 % (ref 0.0–0.2)

## 2023-10-28 LAB — IRON AND TIBC
Iron: 138 ug/dL (ref 28–170)
Saturation Ratios: 25 % (ref 10.4–31.8)
TIBC: 556 ug/dL — ABNORMAL HIGH (ref 250–450)
UIBC: 418 ug/dL

## 2023-10-28 LAB — FERRITIN: Ferritin: 14 ng/mL (ref 11–307)

## 2023-10-29 ENCOUNTER — Ambulatory Visit: Attending: Cardiology | Admitting: Cardiology

## 2023-10-29 ENCOUNTER — Ambulatory Visit (INDEPENDENT_AMBULATORY_CARE_PROVIDER_SITE_OTHER): Admitting: Pulmonary Disease

## 2023-10-29 ENCOUNTER — Encounter: Payer: Self-pay | Admitting: Cardiology

## 2023-10-29 ENCOUNTER — Encounter: Payer: Self-pay | Admitting: Pulmonary Disease

## 2023-10-29 VITALS — BP 136/84 | HR 54 | Temp 97.6°F | Ht 65.0 in | Wt 214.0 lb

## 2023-10-29 VITALS — BP 140/70 | HR 77 | Ht 65.0 in | Wt 216.0 lb

## 2023-10-29 DIAGNOSIS — R0602 Shortness of breath: Secondary | ICD-10-CM | POA: Diagnosis not present

## 2023-10-29 DIAGNOSIS — D649 Anemia, unspecified: Secondary | ICD-10-CM | POA: Diagnosis not present

## 2023-10-29 DIAGNOSIS — R0609 Other forms of dyspnea: Secondary | ICD-10-CM | POA: Diagnosis not present

## 2023-10-29 DIAGNOSIS — K9589 Other complications of other bariatric procedure: Secondary | ICD-10-CM

## 2023-10-29 DIAGNOSIS — R030 Elevated blood-pressure reading, without diagnosis of hypertension: Secondary | ICD-10-CM

## 2023-10-29 DIAGNOSIS — Z7722 Contact with and (suspected) exposure to environmental tobacco smoke (acute) (chronic): Secondary | ICD-10-CM | POA: Diagnosis not present

## 2023-10-29 DIAGNOSIS — R911 Solitary pulmonary nodule: Secondary | ICD-10-CM

## 2023-10-29 DIAGNOSIS — Z8679 Personal history of other diseases of the circulatory system: Secondary | ICD-10-CM

## 2023-10-29 DIAGNOSIS — R079 Chest pain, unspecified: Secondary | ICD-10-CM

## 2023-10-29 DIAGNOSIS — I493 Ventricular premature depolarization: Secondary | ICD-10-CM

## 2023-10-29 NOTE — Patient Instructions (Addendum)
 VISIT SUMMARY:  During your visit, we discussed your shortness of breath and the incidental finding of a lung nodule. We also reviewed your history of anemia and chest pain. A plan was made to further investigate the lung nodule and manage your anemia.  YOUR PLAN:  -LUNG NODULE: A lung nodule is a small growth in the lung that can be caused by inflammation, infection, or, less commonly, cancer. Given your history of secondhand smoke exposure, we will perform a PET scan to check the nodule's activity and a blood test to assess your risk.  If the PET scan shows activity or the nodule grows, we will refer you to a thoracic surgeon as the nodule is in a location that cannot be biopsied by other means..  -CHEST PAIN: Your chest pain, initially thought to be heartburn, has not recurred since your ER visit. It may have been related to gastric reflux. If the chest pain returns, you should start taking Protonix as prescribed.  -ANEMIA: Anemia is a condition where you don't have enough healthy red blood cells to carry adequate oxygen to your body's tissues, causing fatigue and shortness of breath. Your anemia is chronic and has worsened due to missed infusions. You are scheduled to receive an iron infusion soon, which should help improve your symptoms.  INSTRUCTIONS:  Please complete the PET scan and blood test as ordered. Start taking Protonix if your chest pain returns. Attend your scheduled iron infusion to help manage your anemia.  Will see you in follow-up in 3 to 4 weeks call sooner should any new problems arise.

## 2023-10-29 NOTE — Progress Notes (Signed)
 Cardiology Office Note:  .   Date:  10/29/2023  ID:  Rebecca Lang, DOB 05/25/1976, MRN [id number] PCP: Normie Becton, FNP  [facility] Providers Cardiologist:  Constancia Delton, MD    History of Present Illness: .   Rebecca Lang is a 48 y.o. female with past medical history of iron deficiency anemia, depression, bradycardia, fatigue, who is here today for follow-up.  Was initially seen in clinic 03/25/2021 by Dr.Agbor-Etang.  At that time she was referred by her PCP for bradycardia.  She was noted to have heart rates in the 50s on exam.  Repeat heart rate was in the 40s.  She endorsed some fatigue.  At that time EKG showed sinus pericardia with a heart rate of 51.  She states gaining 25 pounds over the last few months.  Endorsing being very active and eating healthy but still not able to lose weight and would like to to try other options.  She follows up with the cancer center [facility] iron infusions to help manage her anemia.  Overall she states feeling heavy but denies any chest pain dizziness or syncope.  There were no medication changes that were made or further testing and that was ordered at that time.  Has been lost to follow-up since that time.  She has had several urgent care and emergency department visits over the years for varying complaints.  She was last seen in the [facility] emergency department 10/24/23, complaining of left-sided chest pain rating down her left arm into her left back for 1 day.  States the pain started yesterday morning and is intermittent.  Went to urgent care prior to presenting to the emergency department.  PVCs were noted on twelve-lead EKG.  She had a little maybe related to acid reflux.  Initial vital signs revealed blood pressure 139/64, pulse 58, respiration 20.  Initial labs were reassuring.  CT scan without PE or aortic pathology.  1.1 cm nodule noted.  She was discharged with Protonix 40 mg daily.  She returns to clinic today stating that overall  she has been doing well.  She states that she had eaten at [employer] and started having chest discomfort in the left side of her chest that wraps around up under her left breast into her back that is a sharp stabbing feeling that she felt as though was acid reflux.  She said it radiated up into her shoulder and down her arm.  She tried to wait it out but it continued to intensify so she went to urgent care.  When she was evaluated in urgent care they did an EKG which revealed she had a PVC and she was advised that she would have to follow-up in the emergency department.  She was evaluated in the emergency department with an unremarkable workup and was placed on Protonix.  On CT of her chest it was also noted that she had a pulmonary nodule and she was also advised to follow-up with pulmonary.  Today she denies any recurrent episodes of chest discomfort but continues to have some shortness of breath and dyspnea on exertion.  She denies any family history of heart disease but she stated that previously she was told that she had an enlarged heart.  She also denies any palpitations, lightheadedness or dizziness.  ROS: 10 point review of system has been reviewed and considered negative except ones are listed in the HPI  Studies Reviewed: Aaron Aas   EKG Interpretation Date/Time:  Thursday October 29 2023  14:58:32 EDT Ventricular Rate:  77 PR Interval:  140 QRS Duration:  84 QT Interval:  394 QTC Calculation: 445 R Axis:   -4  Text Interpretation: Sinus rhythm with sinus arrhythmia with occasional Premature ventricular complexes Nonspecific T wave abnormality When compared with ECG of 24-Oct-2023 10:14, Inverted T waves have replaced nonspecific T wave abnormality in Anterior leads Confirmed by Ronald Cockayne ([id number]) on 10/29/2023 3:08:16 PM     Risk Assessment/Calculations:          Physical Exam:   VS:  BP (!) 140/70 (BP Location: Left Arm, Patient Position: Sitting, Cuff Size: Large)   Pulse 77   Ht 5\' 5"   (1.651 m)   Wt 216 lb (98 kg)   LMP  (LMP Unknown)   SpO2 98%   BMI 35.94 kg/m    Wt Readings from Last 3 Encounters:  10/29/23 216 lb (98 kg)  10/29/23 214 lb (97.1 kg)  10/24/23 199 lb 15.3 oz (90.7 kg)    GEN: Well nourished, well developed in no acute distress NECK: No JVD; No carotid bruits CARDIAC: RRR, extrasystoles, no murmurs, rubs, gallops RESPIRATORY:  Clear to auscultation without rales, wheezing or rhonchi  ABDOMEN: Soft, non-tender, non-distended EXTREMITIES: Trace pretibial edema; No deformity   ASSESSMENT AND PLAN: .   Atypical chest pain after eating fast food concerning for acid reflux.  She has not had any further episodes of chest discomfort just associated shortness of breath.  She has been scheduled for an echocardiogram to evaluate any structural abnormalities.  Elevated blood pressure without the diagnosis of hypertension with blood pressure today 140/70 early was 136/84.  Encouraged to continue to monitor pressures as goal is less than 130/80.  If continued elevated blood pressures consider starting antihypertensive medications.  PVCs noted on EKG with sinus rhythm sinus arrhythmia with PVCs noted occasionally T wave inversions in anterior leads.  No acute changes from prior studies.  Echocardiogram ordered to assess for wall motion abnormality and structural abnormality.  Patient is asymptomatic and does not have palpitations related to the PVCs.  Pulmonary nodule there was an incidental finding on CT scan in the emergency department.  She is scheduled for PET scan.  Ongoing management and follow-up per pulmonary.       Dispo: Patient returns clinic to see MD/APP in 3 months or sooner if needed for reassessment and evaluation of symptoms after echocardiogram has been completed.  Signed, Loria Lacina, NP

## 2023-10-29 NOTE — Patient Instructions (Signed)
 Medication Instructions:  Your physician recommends that you continue on your current medications as directed. Please refer to the Current Medication list given to you today.  *If you need a refill on your cardiac medications before your next appointment, please call your pharmacy*  Lab Work: No labs ordered today  If you have labs (blood work) drawn today and your tests are completely normal, you will receive your results only by: MyChart Message (if you have MyChart) OR A paper copy in the mail If you have any lab test that is abnormal or we need to change your treatment, we will call you to review the results.  Testing/Procedures: Your physician has requested that you have an echocardiogram. Echocardiography is a painless test that uses sound waves to create images of your heart. It provides your doctor with information about the size and shape of your heart and how well your heart's chambers and valves are working.   You may receive an ultrasound enhancing agent through an IV if needed to better visualize your heart during the echo. This procedure takes approximately one hour.  There are no restrictions for this procedure.  This will take place at [address] [facility], [place] [address]  Please note: We ask at that you not bring children with you during ultrasound (echo/ vascular) testing. Due to room size and safety concerns, children are not allowed in the ultrasound rooms during exams. Our front office staff cannot provide observation of children in our lobby area while testing is being conducted. An adult accompanying a patient to their appointment will only be allowed in the ultrasound room at the discretion of the ultrasound technician under special circumstances. We apologize for any inconvenience.   Follow-Up: At [facility], you and your health needs are our priority.  As part of our continuing mission to provide you with exceptional heart  care, our providers are all part of one team.  This team includes your primary Cardiologist (physician) and Advanced Practice Providers or APPs (Physician Assistants and Nurse Practitioners) who all work together to provide you with the care you need, when you need it.  Your next appointment:   3 month(s)  Provider:   You may see Constancia Delton, MD or one of the following Advanced Practice Providers on your designated Care Team:   Laneta Pintos, NP Gildardo Labrador, PA-C Varney Gentleman, PA-C Cadence Knightdale, PA-C Ronald Cockayne, NP Morey Ar, NP

## 2023-10-29 NOTE — Progress Notes (Signed)
 Subjective:    Patient ID: Rebecca Lang, female    DOB: [birth year], 48 y.o.   MRN: [id number]  Patient Care Team: Normie Becton, FNP as PCP - General (Family Medicine) Constancia Delton, MD as PCP - Cardiology (Cardiology) Shellie Dials, MD as Consulting Physician (Hematology and Oncology)  Chief Complaint  Patient presents with   Consult    Shortness of breath on exertion.     BACKGROUND: This 48 year old lifelong never smoker, presents for evaluation of a lung nodule found incidentally during imaging to rule out PE.  The patient also has issues with shortness of breath in the setting of significant anemia.  HPI Discussed the use of AI scribe software for clinical note transcription with the patient, who gave verbal consent to proceed.  History of Present Illness   Rebecca Lang is a 48 year old female with anemia who presents with shortness of breath and an incidental finding of a lung nodule.  She presents today with her mother.  She experiences shortness of breath, which led to the discovery of a lung nodule during a CT scan. Initially, she sought medical attention for chest pain, which she thought was heartburn as it occurred after eating. The pain radiated from her shoulder blade down to her arm. No previous CT scans of her chest are available for comparison.  She has a history of anemia since the birth of her oldest son, who is now 10 years old. She typically receives infusions at a cancer center to manage her anemia but has not had them recently due to insurance issues. She is scheduled to receive an infusion soon. Her anemia contributes to her fatigue and shortness of breath.  She has undergone several surgeries in the past, including three C-sections, two ectopic pregnancies, ear surgery to replace her eardrum, and a gastric bypass in 2004 at [facility]. She has been exposed to secondhand smoke throughout her life, as her parents, sister, aunts, uncles, cousins, and  ex-husband all smoked.  She has not started taking Protonix , which was prescribed after her ER visit for chest pain, as she has not experienced further symptoms since then.   I reviewed the CT scan independently and also showed the scan to the patient.       Review of Systems A 10 point review of systems was performed and it is as noted above otherwise negative.   Past Medical History:  Diagnosis Date   Anemia    Anxiety    Back pain    Depression    Hypertrophic cardiomyopathy (HCC)     Past Surgical History:  Procedure Laterality Date   ABDOMINAL SURGERY  2004   gastric bypass   CESAREAN SECTION     DIAGNOSTIC LAPAROSCOPY WITH REMOVAL OF ECTOPIC PREGNANCY Right 12/03/2018   Procedure: DIAGNOSTIC LAPAROSCOPY WITH REMOVAL OF RIGHT ECTOPIC PREGNANCY AND RIGHT OVARY;  Surgeon: Prescilla Brod, MD;  Location: [facility] ORS;  Service: Gynecology;  Laterality: Right;   INNER EAR SURGERY     LAPAROSCOPIC GASTRIC BYPASS  2004   tubal rupture      Patient Active Problem List   Diagnosis Date Noted   Panic attacks 12/17/2021   Depression, major, single episode, severe (HCC) 12/17/2021   Adjustment insomnia 12/17/2021   Elevated blood pressure reading without diagnosis of hypertension 12/17/2021   Bradycardia 03/20/2021   Fatigue 03/20/2021   Weight gain 03/20/2021   Galactorrhea of both breasts 01/18/2020   Light headedness 01/18/2020   Vitamin D insufficiency  01/18/2020   Hypoglycemia 01/18/2020   Menorrhagia with regular cycle 12/16/2019   Fracture of foot 12/16/2019   Blood sugar increased 12/16/2019   Encounter for screening mammogram for malignant neoplasm of breast 12/16/2019   History of Roux-en-Y gastric bypass- around age 54 12/16/2019   History of ectopic pregnancy 12/16/2019   Ectopic pregnancy, tubal 12/03/2018   Iron  deficiency anemia 03/02/2018    Family History  Problem Relation Age of Onset   Arthritis Mother    Other Mother        back pain w stimulator  ( internal )   Kidney disease Father    Intestinal polyp Father    Behavior problems Sister    Diabetes Maternal Grandfather    Kidney disease Paternal Grandmother    Cancer Paternal Grandfather     Social History   Tobacco Use   Smoking status: Never    Passive exposure: Yes   Smokeless tobacco: Never  Substance Use Topics   Alcohol use: Yes    Comment: socially beer    Allergies  Allergen Reactions   Clindamycin /Lincomycin     Rash    No outpatient medications have been marked as taking for the 10/29/23 encounter (Office Visit) with Marc Senior, MD.     There is no immunization history on file for this patient.      Objective:     BP 136/84 (BP Location: Left Arm, Patient Position: Sitting, Cuff Size: Normal)   Pulse (!) 54   Temp 97.6 F (36.4 C) (Temporal)   Ht 5\' 5"  (1.651 m)   Wt 214 lb (97.1 kg)   LMP  (LMP Unknown)   SpO2 100%   BMI 35.61 kg/m   SpO2: 100 %  GENERAL: Obese woman, no acute distress, fully ambulatory, no conversational dyspnea. HEAD: Normocephalic, atraumatic.  EYES: Pupils equal, round, reactive to light.  No scleral icterus.  MOUTH: Dentition intact, oral mucosa moist.  No thrush. NECK: Supple. No thyromegaly. Trachea midline. No JVD.  No adenopathy. PULMONARY: Good air entry bilaterally.  No adventitious sounds. CARDIOVASCULAR: S1 and S2. Regular rate and rhythm.  No rubs, murmurs or gallops heard. ABDOMEN: Obese otherwise benign. MUSCULOSKELETAL: No joint deformity, no clubbing, no edema.  NEUROLOGIC: No overt focal deficit, no gait disturbance, speech is fluent. SKIN: Intact,warm,dry. PSYCH: Anxious mood, normal behavior.  SPN Malignancy Risk Score (Mayo): 2.5%    Representative image from CT performed 24 October 2023 showing a 1.1 cm nodule in the right lower lobe:    Assessment & Plan:     ICD-10-CM   1. Lung nodule RLL 1.1 cm  R91.1 NM PET Image Initial (PI) Skull Base To Thigh (F-18 FDG)    2. Iron   deficiency anemia following bariatric surgery  K95.89    D50.8       Orders Placed This Encounter  Procedures   NM PET Image Initial (PI) Skull Base To Thigh (F-18 FDG)    Standing Status:   Future    Expected Date:   11/05/2023    Expiration Date:   10/28/2024    If indicated for the ordered procedure, I authorize the administration of a radiopharmaceutical per Radiology protocol:   Yes    Is the patient pregnant?:   No    Preferred imaging location?:   [facility]  Nodify Lung (Biodesix) was ordered sample collected.   Assessment and Plan    Lung nodule Incidental lung nodule at the right costophrenic angle. Differential diagnosis includes inflammatory causes,  granulomas, or malignancy. She is low risk for cancer due to non-smoking status but has significant secondhand smoke exposure. The nodule's growth rate is typically slow, and immediate biopsy would require surgery due to its location. A PET scan is preferred to assess metabolic activity, indicating malignancy if the nodule has significant FDG activity. Observation is recommended if the PET scan shows no activity and the Nodify Lung (Biodesix) blood test indicates low risk. Referral to a thoracic surgeon is necessary if the nodule grows or shows activity. - Order PET scan to assess metabolic activity of the nodule. - Perform blood test to assess risk category for lung nodule. - Schedule follow-up CT scan in three months to monitor nodule behavior. - Refer to thoracic surgeon if PET scan shows activity or if nodule grows.  Chest pain Chest pain initially thought to be heartburn, occurring postprandially and radiating to the shoulder blade and arm. No recurrence since the ER visit.  - Advise to start Protonix  if chest pain recurs.  Anemia Chronic anemia since childbirth, exacerbated by lack of recent infusions due to insurance issues. Contributes to fatigue and dyspnea. Scheduled for an infusion soon, expected to improve  symptoms. - Administer iron  infusion at the cancer center.     Advised if symptoms do not improve or worsen, to please contact office for sooner follow up or seek emergency care.    I spent 60 minutes of dedicated to the care of this patient on the date of this encounter to include pre-visit review of records, face-to-face time with the patient discussing conditions above, post visit ordering of testing, clinical documentation with the electronic health record, making appropriate referrals as documented, and communicating necessary findings to members of the patients care team.   C. Chloe Counter, MD Advanced Bronchoscopy PCCM [facility]    *This note was dictated using voice recognition software/Dragon.  Despite best efforts to proofread, errors can occur which can change the meaning. Any transcriptional errors that result from this process are unintentional and may not be fully corrected at the time of dictation.

## 2023-10-30 ENCOUNTER — Other Ambulatory Visit: Payer: Self-pay

## 2023-10-30 ENCOUNTER — Inpatient Hospital Stay

## 2023-10-30 ENCOUNTER — Inpatient Hospital Stay: Admitting: Oncology

## 2023-10-30 ENCOUNTER — Encounter: Payer: Self-pay | Admitting: Oncology

## 2023-10-30 VITALS — BP 125/64 | HR 69 | Temp 98.3°F | Resp 16 | Ht 65.0 in | Wt 215.5 lb

## 2023-10-30 DIAGNOSIS — D509 Iron deficiency anemia, unspecified: Secondary | ICD-10-CM

## 2023-10-30 DIAGNOSIS — E538 Deficiency of other specified B group vitamins: Secondary | ICD-10-CM | POA: Diagnosis not present

## 2023-10-30 LAB — RETICULOCYTES
Immature Retic Fract: 12.3 % (ref 2.3–15.9)
RBC.: 4.04 MIL/uL (ref 3.87–5.11)
Retic Count, Absolute: 52.1 10*3/uL (ref 19.0–186.0)
Retic Ct Pct: 1.3 % (ref 0.4–3.1)

## 2023-10-30 LAB — FOLATE: Folate: 18.5 ng/mL (ref >5.9)

## 2023-10-30 MED ORDER — IRON SUCROSE 20 MG/ML IV SOLN
200.0000 mg | Freq: Once | INTRAVENOUS | Status: AC
Start: 2023-10-30 — End: 2023-10-30
  Administered 2023-10-30: 200 mg via INTRAVENOUS
  Filled 2023-10-30: qty 10

## 2023-10-30 NOTE — Progress Notes (Signed)
 [facility]  [phone] [phone]  ID: Pamla Boettcher OB: Jan 13, 1976  MR#: [id number]  AOZ#:[id number]  Patient Care Team: Normie Becton, FNP as PCP - General (Family Medicine) Constancia Delton, MD as PCP - Cardiology (Cardiology) Shellie Dials, MD as Consulting Physician (Hematology and Oncology)  CHIEF COMPLAINT: Iron  deficiency and B12 deficiency anemia.  INTERVAL HISTORY: Patient last eval waited in February 2023.  She returns to clinic today complaining of profound weakness and fatigue and mild anemia.  She otherwise feels well.  She has no neurologic complaints.  She denies any recent fevers or illnesses.  She has good appetite and denies weight loss.  She has no chest pain, shortness of breath, cough, or hemoptysis.  She denies any nausea, vomiting, constipation, or diarrhea.  She has no melena or hematochezia.  She has no urinary complaints.  Patient offers no further specific complaints today.    REVIEW OF SYSTEMS:   Review of Systems  Constitutional:  Positive for malaise/fatigue. Negative for fever and weight loss.  Respiratory: Negative.  Negative for cough and shortness of breath.   Cardiovascular: Negative.  Negative for chest pain and leg swelling.  Gastrointestinal: Negative.  Negative for abdominal pain, blood in stool and melena.  Genitourinary: Negative.  Negative for dysuria and hematuria.  Musculoskeletal: Negative.  Negative for back pain.  Skin: Negative.  Negative for rash.  Neurological:  Positive for weakness. Negative for focal weakness and headaches.  Psychiatric/Behavioral: Negative.  The patient is not nervous/anxious.     As per HPI. Otherwise, a complete review of systems is negative.  PAST MEDICAL HISTORY: Past Medical History:  Diagnosis Date   Anemia    Anxiety    Back pain    Depression    Hypertrophic cardiomyopathy (HCC)     PAST SURGICAL HISTORY: Past Surgical History:  Procedure  Laterality Date   ABDOMINAL SURGERY  2004   gastric bypass   CESAREAN SECTION     DIAGNOSTIC LAPAROSCOPY WITH REMOVAL OF ECTOPIC PREGNANCY Right 12/03/2018   Procedure: DIAGNOSTIC LAPAROSCOPY WITH REMOVAL OF RIGHT ECTOPIC PREGNANCY AND RIGHT OVARY;  Surgeon: Prescilla Brod, MD;  Location: [facility] ORS;  Service: Gynecology;  Laterality: Right;   INNER EAR SURGERY     LAPAROSCOPIC GASTRIC BYPASS  2004   tubal rupture      FAMILY HISTORY: Family History  Problem Relation Age of Onset   Arthritis Mother    Other Mother        back pain w stimulator ( internal )   Kidney disease Father    Intestinal polyp Father    Behavior problems Sister    Diabetes Maternal Grandfather    Kidney disease Paternal Grandmother    Cancer Paternal Grandfather     ADVANCED DIRECTIVES (Y/N):  N  HEALTH MAINTENANCE: Social History   Tobacco Use   Smoking status: Never    Passive exposure: Yes   Smokeless tobacco: Never  Vaping Use   Vaping status: Never Used  Substance Use Topics   Alcohol use: Yes    Comment: socially beer   Drug use: No     Colonoscopy:  PAP:  Bone density:  Lipid panel:  Allergies  Allergen Reactions   Clindamycin /Lincomycin     Rash    Current Outpatient Medications  Medication Sig Dispense Refill   pantoprazole  (PROTONIX ) 40 MG tablet Take 40 mg by mouth daily as needed.     No current facility-administered medications for this visit.  OBJECTIVE: Vitals:   10/30/23 0925  BP: 125/64  Pulse: 69  Resp: 16  Temp: 98.3 F (36.8 C)  SpO2: 98%     Body mass index is 35.86 kg/m.    ECOG FS:0 - Asymptomatic  General: Well-developed, well-nourished, no acute distress. Eyes: Pink conjunctiva, anicteric sclera. HEENT: Normocephalic, moist mucous membranes. Lungs: No audible wheezing or coughing. Heart: Regular rate and rhythm. Abdomen: Soft, nontender, no obvious distention. Musculoskeletal: No edema, cyanosis, or clubbing. Neuro: Alert, answering all  questions appropriately. Cranial nerves grossly intact. Skin: No rashes or petechiae noted. Psych: Normal affect.  LAB RESULTS:  Lab Results  Component Value Date   NA 137 10/24/2023   K 4.2 10/24/2023   CL 107 10/24/2023   CO2 24 10/24/2023   GLUCOSE 95 10/24/2023   BUN 13 10/24/2023   CREATININE 0.52 10/24/2023   CALCIUM 9.2 10/24/2023   PROT 6.7 02/09/2023   ALBUMIN 3.6 02/09/2023   AST 19 02/09/2023   ALT 13 02/09/2023   ALKPHOS 81 02/09/2023   BILITOT 0.3 02/09/2023   GFRNONAA >60 10/24/2023   GFRAA 130 12/16/2019    Lab Results  Component Value Date   WBC 6.2 10/28/2023   NEUTROABS 5.2 10/28/2023   HGB 10.2 (L) 10/28/2023   HCT 32.5 (L) 10/28/2023   MCV 75.6 (L) 10/28/2023   PLT 362 10/28/2023   Lab Results  Component Value Date   IRON  138 10/28/2023   TIBC 556 (H) 10/28/2023   IRONPCTSAT 25 10/28/2023   Lab Results  Component Value Date   FERRITIN 14 10/28/2023     STUDIES: CT Angio Chest Aorta W and/or Wo Contrast Addendum Date: 10/24/2023 ADDENDUM REPORT: 10/24/2023 13:11 ADDENDUM: The results of this examination were discussed with Dr. Curlene Dotter or by phone at approximately 1:10 p.m. on 10/24/2023. Electronically Signed   By: Reagan Camera M.D.   On: 10/24/2023 13:11   Result Date: 10/24/2023 CLINICAL DATA:  Acute aortic syndrome EXAM: CT ANGIOGRAPHY CHEST WITH CONTRAST TECHNIQUE: Multidetector CT imaging of the chest was performed using the standard protocol during bolus administration of intravenous contrast. Multiplanar CT image reconstructions and MIPs were obtained to evaluate the vascular anatomy. Multiplanar image (3D post-processing) reconstructions and MIPs were obtained to evaluate the vascular anatomy. RADIATION DOSE REDUCTION: This exam was performed according to the departmental dose-optimization program which includes automated exposure control, adjustment of the mA and/or kV according to patient size and/or use of iterative reconstruction  technique. CONTRAST:  75mL OMNIPAQUE  IOHEXOL  350 MG/ML SOLN COMPARISON:  None Available. FINDINGS: Cardiovascular: No aortic dissection or intramural hematoma. Three vessel aortic arch. Heart size is mildly enlarged.  No pericardial effusion. Main pulmonary artery: Within normal limits. Mediastinum/Nodes: Granulomatous calcifications. Lungs/Pleura: A 1.1 cm pulmonary nodule is present in the right lower lobe which is noncalcified and best seen on image 128 of series 12. Upper Abdomen: Postsurgical changes in the upper abdomen from prior gastric surgery. Musculoskeletal: Nothing significant. Review of the MIP images confirms the above findings. IMPRESSION: 1. No aortic dissection. 2. A noncalcified 1.1 cm right lower lobe pulmonary nodules present. Consider outpatient pulmonary consultation for further evaluation. 3. Postsurgical changes from prior gastric surgery. These results will be called to the ordering clinician or representative by the Radiologist Assistant, and communication documented in the PACS or [employer]. Electronically Signed: By: Reagan Camera M.D. On: 10/24/2023 13:07   DG Chest 2 View Result Date: 10/24/2023 CLINICAL DATA:  Chest pain EXAM: CHEST - 2 VIEW COMPARISON:  07/03/2022 FINDINGS:  Lungs clear no pneumothorax. Heart size and mediastinal contours are within normal limits. No effusion. Visualized bones unremarkable.  Surgical clips left upper abdomen. IMPRESSION: No acute cardiopulmonary disease. Electronically Signed   By: Nicoletta Barrier M.D.   On: 10/24/2023 11:04    ASSESSMENT: Iron  deficiency and B12 deficiency anemia  PLAN:   Iron  deficiency anemia: Likely secondary to poor absorption from patient's history of gastric bypass surgery.  Patient's hemoglobin is decreased at 10.2 and although her iron  stores are at the low level of normal limits she is symptomatic therefore we will proceed with 200 mg IV Venofer  today.  She does not require additional treatments.  Return to clinic  in 3 months with repeat laboratory work, further evaluation, and consideration of additional IV Venofer .   B12 deficiency: B12 levels are pending at time of dictation.  Patient reports she has not done home B12 injections in quite some time. Pulmonary nodule: Patient has a PET scan scheduled for November 06, 2023 and follow-up with pulmonary several weeks later.  I spent a total of 30 minutes reviewing chart data, face-to-face evaluation with the patient, counseling and coordination of care as detailed above.  Patient expressed understanding and was in agreement with this plan. She also understands that She can call clinic at any time with any questions, concerns, or complaints.    Shellie Dials, MD   10/30/2023 10:34 AM

## 2023-10-30 NOTE — Progress Notes (Signed)
 Patient states she feels so tired and has no energy. Wanted to have her B12 panel checked since she used to get injections.

## 2023-10-31 LAB — VITAMIN B12: Vitamin B-12: 92 pg/mL — ABNORMAL LOW (ref 180–914)

## 2023-10-31 LAB — THYROID PANEL WITH TSH
Free Thyroxine Index: 1.8 (ref 1.2–4.9)
T3 Uptake Ratio: 24 % (ref 24–39)
T4, Total: 7.4 ug/dL (ref 4.5–12.0)
TSH: 2.51 u[IU]/mL (ref 0.450–4.500)

## 2023-11-01 LAB — CERULOPLASMIN: Ceruloplasmin: 40 mg/dL — ABNORMAL HIGH (ref 19.0–39.0)

## 2023-11-02 ENCOUNTER — Other Ambulatory Visit: Payer: Self-pay | Admitting: *Deleted

## 2023-11-02 ENCOUNTER — Telehealth: Payer: Self-pay | Admitting: *Deleted

## 2023-11-02 LAB — ZINC: Zinc: 58 ug/dL (ref 44–115)

## 2023-11-02 MED ORDER — CYANOCOBALAMIN 1000 MCG/ML IJ SOLN: 1000.0000 ug | INTRAMUSCULAR | 0 refills | Status: DC

## 2023-11-02 MED ORDER — NEEDLES & SYRINGES MISC: 1.0000 mL | 0 refills | Status: AC

## 2023-11-02 NOTE — Telephone Encounter (Signed)
 Call placed to patient regarding B12 results, per Dr. Adrian Alba patient should continue B12 injections. Patient prefers to continue B12 injections at home. [facility]. Patient advised that after this prescription is complete primary care physician will need to prescribe further. Patient verbalized understanding and is in agreement with plan.

## 2023-11-04 LAB — HGB FRACTIONATION CASCADE
Hgb A2: 2.5 % (ref 1.8–3.2)
Hgb A: 97.5 % (ref 96.4–98.8)
Hgb F: 0 % (ref 0.0–2.0)
Hgb S: 0 %

## 2023-11-06 ENCOUNTER — Ambulatory Visit
Admission: RE | Admit: 2023-11-06 | Discharge: 2023-11-06 | Disposition: A | Discharge status: Home / Self Care | Source: Ambulatory Visit | Attending: Pulmonary Disease | Admitting: Pulmonary Disease

## 2023-11-06 DIAGNOSIS — K439 Ventral hernia without obstruction or gangrene: Secondary | ICD-10-CM | POA: Diagnosis not present

## 2023-11-06 DIAGNOSIS — I517 Cardiomegaly: Secondary | ICD-10-CM | POA: Diagnosis not present

## 2023-11-06 DIAGNOSIS — Z9884 Bariatric surgery status: Secondary | ICD-10-CM | POA: Diagnosis not present

## 2023-11-06 DIAGNOSIS — R911 Solitary pulmonary nodule: Secondary | ICD-10-CM | POA: Diagnosis present

## 2023-11-06 LAB — GLUCOSE, CAPILLARY: Glucose-Capillary: 92 mg/dL (ref 70–99)

## 2023-11-06 MED ORDER — FLUDEOXYGLUCOSE F - 18 (FDG) INJECTION
11.4800 | Freq: Once | INTRAVENOUS | Status: AC | PRN
  Administered 2023-11-06: 11.48 via INTRAVENOUS

## 2023-11-26 ENCOUNTER — Ambulatory Visit: Admitting: Pulmonary Disease

## 2023-11-26 ENCOUNTER — Encounter: Payer: Self-pay | Admitting: Pulmonary Disease

## 2023-11-26 VITALS — BP 122/74 | HR 61 | Ht 65.0 in | Wt 212.0 lb

## 2023-11-26 DIAGNOSIS — R911 Solitary pulmonary nodule: Secondary | ICD-10-CM | POA: Diagnosis not present

## 2023-11-26 DIAGNOSIS — R0602 Shortness of breath: Secondary | ICD-10-CM

## 2023-11-26 LAB — NITRIC OXIDE: Nitric Oxide: 22

## 2023-11-26 NOTE — Patient Instructions (Signed)
 VISIT SUMMARY:  Today, we discussed your concerns about a benign mass and recent shortness of breath. We reviewed your PET scan results and talked about your symptoms and possible causes.  YOUR PLAN:  -SHORTNESS OF BREATH: Shortness of breath can be caused by various factors, including exposure to cold environments and allergens.  Nitric oxide was low noting no type II inflammation.  Continue to monitor.  -BENIGN HAMARTOMA: A benign hamartoma is a non-cancerous nodule. Your PET scan showed no activity, indicating it is not cancerous. However, you should monitor for any changes such as pain, cough, or weight loss. We will schedule a follow-up in one year to check its stability.  INSTRUCTIONS:  Please follow up in one year to monitor the stability of the benign hamartoma.

## 2023-11-26 NOTE — Progress Notes (Signed)
 Subjective:    Patient ID: Rebecca Lang, female    DOB: September 06, 1975, 48 y.o.   MRN: [id number]  Patient Care Team: Normie Becton, FNP as PCP - General (Family Medicine) Constancia Delton, MD as PCP - Cardiology (Cardiology) Shellie Dials, MD as Consulting Physician (Hematology and Oncology)  Chief Complaint  Patient presents with   Follow-up    Having some increased SOB and cough this past wk off and on. Cough is non prod.     BACKGROUND/INTERVAL:This 48 year old lifelong never smoker, presents for follow-up of a lung nodule found incidentally during imaging to rule out PE. The patient also has issues with shortness of breath in the setting of significant anemia.  She was initially seen on 29 October 2023 for the details of that visit please refer to that note  HPI Discussed the use of AI scribe software for clinical note transcription with the patient, who gave verbal consent to proceed.  History of Present Illness   Rebecca Lang is a 48 year old female who presents with concerns about a benign lung nodule and recent shortness of breath.  She is concerned about a previously identified lung nodule. A PET scan was performed, showing no activity in the area of concern. She seeks clarification on whether the nodule could become cancerous in the future. No cough, or weight loss.  She has experienced shortness of breath over the past week, describing it as feeling like she is 'choking on my breath'. She attributes this to working in a cold environment and exposure to allergens. She has been working in a freezer, resetting freezers all week long, which she believes may have contributed to her symptoms.     Review of Systems A 10 point review of systems was performed and it is as noted above otherwise negative.   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   Elevated blood pressure reading without diagnosis of hypertension 12/17/2021   Bradycardia 03/20/2021   Fatigue 03/20/2021   Weight gain 03/20/2021   Galactorrhea of both breasts 01/18/2020   Light headedness 01/18/2020   Vitamin D insufficiency 01/18/2020   Hypoglycemia 01/18/2020   Menorrhagia with regular cycle 12/16/2019   Fracture of foot 12/16/2019   Blood sugar increased 12/16/2019   Encounter for screening mammogram for malignant neoplasm of breast 12/16/2019   History of Roux-en-Y gastric bypass- around age 28 12/16/2019   History of ectopic pregnancy 12/16/2019   Ectopic pregnancy, tubal 12/03/2018   Iron  deficiency anemia 03/02/2018    Social History   Tobacco Use   Smoking status: Never    Passive exposure: Yes   Smokeless tobacco: Never  Substance Use Topics   Alcohol use: Yes    Comment: socially beer    Allergies  Allergen Reactions   Clindamycin /Lincomycin     Rash    Current Meds  Medication Sig   cyanocobalamin  (VITAMIN B12) 1000 MCG/ML injection Inject 1 mL (1,000 mcg total) into the muscle every 30 (thirty) days.   Needles & Syringes MISC 1 mL by Does not apply route every 30 (thirty) days.     There is no immunization history on file for this patient.      Objective:     BP 122/74 (BP Location: Right Arm, Cuff Size: Large)   Pulse 61   Ht 5\' 5"  (1.651 m)   Wt  212 lb (96.2 kg)   LMP 11/06/2023 (Exact Date)   SpO2 98%   BMI 35.28 kg/m   SpO2: 98 % O2 Device: None (Room air)  GENERAL: Obese woman, no acute distress, fully ambulatory, no conversational dyspnea. HEAD: Normocephalic, atraumatic.  EYES: Pupils equal, round, reactive to light.  No scleral icterus.  MOUTH: Dentition intact, oral mucosa moist.  No thrush. NECK: Supple. No thyromegaly. Trachea midline. No JVD.  No adenopathy. PULMONARY: Good air entry bilaterally.  No adventitious sounds. CARDIOVASCULAR: S1 and S2. Regular rate and rhythm.  No rubs, murmurs or  gallops heard. ABDOMEN: Obese otherwise benign. MUSCULOSKELETAL: No joint deformity, no clubbing, no edema.  NEUROLOGIC: No overt focal deficit, no gait disturbance, speech is fluent. SKIN: Intact,warm,dry. PSYCH: Anxious mood, normal behavior.  Lab Results  Component Value Date   NITRICOXIDE 22 11/26/2023  *No evidence of type II inflammation  PET/CT performed 06 November 2023 independently reviewed and reviewed with the patient, this shows a pleural-based nodule on the right posterolateral costophrenic angle measures 1.1 x 0.8 cm and low SUV activity favors a benign process.  Recommend CT follow-up in a year.   Assessment & Plan:     ICD-10-CM   1. Lung nodule RLL 1.1 cm  R91.1 CT CHEST WO CONTRAST    2. Shortness of breath  R06.02 Nitric oxide       Orders Placed This Encounter  Procedures   CT CHEST WO CONTRAST    Standing Status:   Future    Expected Date:   11/04/2024    Expiration Date:   11/25/2024    Is patient pregnant?:   No    Preferred imaging location?:   [facility]   Nitric oxide    Discussion:    Shortness of breath She reports a sensation of choking on her breath for the past week, likely due to exposure to cold environments and allergens. Testing is necessary to evaluate airway inflammation and assess for asthma. - Nitric oxide  level normal today. - Patient wants to defer inhalers for now.  Benign hematoma PET scan indicates no activity in the area of concern, suggesting a benign etiology rather than a malignant process. Although the lesion is likely benign, it is located in an area that may cause pain during deep breaths if it changes. She should monitor for changes such as pain, cough, or weight loss. - Schedule follow-up in one year to monitor nodule stability.       Advised if symptoms do not improve or worsen, to please contact office for sooner follow up or seek emergency care.    I spent 30 minutes of dedicated to the care of this patient on  the date of this encounter to include pre-visit review of records, face-to-face time with the patient discussing conditions above, post visit ordering of testing, clinical documentation with the electronic health record, making appropriate referrals as documented, and communicating necessary findings to members of the patients care team.     C. Chloe Counter, MD Advanced Bronchoscopy PCCM [facility]    *This note was generated using voice recognition software/Dragon and/or AI transcription program.  Despite best efforts to proofread, errors can occur which can change the meaning. Any transcriptional errors that result from this process are unintentional and may not be fully corrected at the time of dictation.

## 2023-11-30 ENCOUNTER — Ambulatory Visit: Payer: Self-pay | Admitting: Cardiology

## 2023-11-30 ENCOUNTER — Ambulatory Visit: Attending: Cardiology

## 2023-11-30 DIAGNOSIS — R0602 Shortness of breath: Secondary | ICD-10-CM

## 2023-11-30 DIAGNOSIS — Z8679 Personal history of other diseases of the circulatory system: Secondary | ICD-10-CM

## 2023-11-30 DIAGNOSIS — R0609 Other forms of dyspnea: Secondary | ICD-10-CM

## 2023-11-30 LAB — ECHOCARDIOGRAM COMPLETE
AR max vel: 2.12 cm2
AV Area VTI: 2.12 cm2
AV Area mean vel: 2.04 cm2
AV Mean grad: 8 mmHg
AV Peak grad: 14.1 mmHg
Ao pk vel: 1.88 m/s
Area-P 1/2: 2.91 cm2
S' Lateral: 3.41 cm

## 2023-12-08 ENCOUNTER — Telehealth: Payer: Self-pay | Admitting: Oncology

## 2023-12-08 NOTE — Telephone Encounter (Signed)
Called to reschedule appointment no answer left voicemail ?

## 2023-12-13 ENCOUNTER — Encounter: Payer: Self-pay | Admitting: Pulmonary Disease

## 2024-01-09 ENCOUNTER — Telehealth: Admitting: Family Medicine

## 2024-01-09 DIAGNOSIS — S80869A Insect bite (nonvenomous), unspecified lower leg, initial encounter: Secondary | ICD-10-CM

## 2024-01-09 DIAGNOSIS — W57XXXA Bitten or stung by nonvenomous insect and other nonvenomous arthropods, initial encounter: Secondary | ICD-10-CM

## 2024-01-09 DIAGNOSIS — L03119 Cellulitis of unspecified part of limb: Secondary | ICD-10-CM

## 2024-01-09 MED ORDER — AMOXICILLIN-POT CLAVULANATE 875-125 MG PO TABS: 1.0000 | ORAL_TABLET | Freq: Two times a day (BID) | ORAL | 0 refills | Status: AC

## 2024-01-09 NOTE — Patient Instructions (Signed)
 Rebecca Lang, thank you for joining Roosvelt Mater, PA-C for today's virtual visit.  While this provider is not your primary care provider (PCP), if your PCP is located in our provider database this encounter information will be shared with them immediately following your visit.   A [facility] MyChart account gives you access to today's visit and all your visits, tests, and labs performed at [facility]  click here if you don't have a [facility] MyChart account or go to [email or web address]  Consent: (Patient) Rebecca Lang provided verbal consent for this virtual visit at the beginning of the encounter.  Current Medications:  Current Outpatient Medications:    amoxicillin -clavulanate (AUGMENTIN ) 875-125 MG tablet, Take 1 tablet by mouth 2 (two) times daily for 7 days., Disp: 14 tablet, Rfl: 0   cyanocobalamin  (VITAMIN B12) 1000 MCG/ML injection, Inject 1 mL (1,000 mcg total) into the muscle every 30 (thirty) days., Disp: 4 mL, Rfl: 0   Needles & Syringes MISC, 1 mL by Does not apply route every 30 (thirty) days., Disp: 4 Dose, Rfl: 0   Medications ordered in this encounter:  Meds ordered this encounter  Medications   amoxicillin -clavulanate (AUGMENTIN ) 875-125 MG tablet    Sig: Take 1 tablet by mouth 2 (two) times daily for 7 days.    Dispense:  14 tablet    Refill:  0     *If you need refills on other medications prior to your next appointment, please contact your pharmacy*  Follow-Up: Call back or seek an in-person evaluation if the symptoms worsen or if the condition fails to improve as anticipated.  [facility] [phone]  Other Instructions Cellulitis, Adult  Cellulitis is a skin infection. The infected area is usually warm, red, swollen, and tender. It most commonly occurs on the lower body, such as the legs, feet, and toes, but this condition can occur on any part of the body. The infection can travel to the muscles, blood, and  underlying tissue and become life-threatening without treatment. It is important to get medical treatment right away for this condition. What are the causes? Cellulitis is caused by bacteria. The bacteria enter through a break in the skin, such as a cut, burn, insect or animal bite, open sore, or crack. What increases the risk? This condition is more likely to occur in people who: Have a weak body's defense system (immune system). Are older than 46 years old. Have diabetes. Have a type of long-term (chronic) liver disease (cirrhosis) or kidney disease. Are obese. Have a skin condition such as: An itchy rash, such as eczema or psoriasis. A fungal rash on the feet or in skinfolds. Blistering rashes, such as shingles or chickenpox. Slow movement of blood in the veins (venous stasis). Fluid buildup below the skin (edema). Have open wounds on the skin, such as cuts, puncture wounds, burns, bites, scrapes, tattoos, piercings, or wounds from surgery. Have had radiation therapy. Use IV drugs. What are the signs or symptoms? Symptoms of this condition include: Skin that looks red, purple, or slightly darker than your usual skin color. Streaks or spots on the skin. Swollen area of the skin. Tenderness or pain when an area of the skin is touched. Warm skin. Fever or chills. Blisters. Tiredness (fatigue). How is this diagnosed? This condition is diagnosed based on a medical history and physical exam. You may also have tests, including: Blood tests. Imaging tests. Tests on a sample of fluid taken from the wound (wound culture). How  is this treated? Treatment for this condition may include: Medicines. These may include antibiotics or medicines to treat allergies (antihistamines). Rest. Applying cold or warm wet cloths (compresses) to the skin. If the condition is severe, you may need to stay in the hospital and get antibiotics through an IV. The infection usually starts to get better within  1-2 days of treatment. Follow these instructions at home: Medicines Take over-the-counter and prescription medicines only as told by your health care provider. If you were prescribed antibiotics, take them as told by your provider. Do not stop using the antibiotic even if you start to feel better. General instructions Drink enough fluid to keep your pee (urine) pale yellow. Do not touch or rub the infected area. Raise (elevate) the infected area above the level of your heart while you are sitting or lying down. Return to your normal activities as told by your provider. Ask your provider what activities are safe for you. Apply warm or cold compresses to the affected area as told by your provider. Keep all follow-up visits. Your provider will need to make sure that a more serious infection is not developing. Contact a health care provider if: You have a fever. Your symptoms do not improve within 1-2 days of starting treatment or you develop new symptoms. Your bone or joint underneath the infected area becomes painful after the skin has healed. Your infection returns in the same area or another area. Signs of this may include: You notice a swollen bump in the infected area. Your red area gets larger, turns dark in color, or becomes more painful. Drainage increases. Pus or a bad smell develops in your infected area. You have more pain. You feel ill and have muscle aches and weakness. You develop vomiting or diarrhea that will not go away. Get help right away if: You notice red streaks coming from the infected area. You notice the skin turns purple or black and falls off. This symptom may be an emergency. Get help right away. Call 911. Do not wait to see if the symptom will go away. Do not drive yourself to the hospital. This information is not intended to replace advice given to you by your health care provider. Make sure you discuss any questions you have with your health care  provider. Document Revised: 02/25/2022 Document Reviewed: 02/25/2022 Elsevier Patient Education  2024 [employer]vier [employer].   If you have been instructed to have an in-person evaluation today at a local Urgent Care facility, please use the link below. It will take you to a list of all of our available [facility], including address, phone number and hours of operation. Please do not delay care.  [facility]  If you or a family member do not have a primary care provider, use the link below to schedule a visit and establish care. When you choose a [facility] primary care physician or advanced practice provider, you gain a long-term partner in health. Find a Primary Care Provider  Learn more about [facility] in-office and virtual care options: [facility] - Get Care Now

## 2024-01-09 NOTE — Progress Notes (Signed)
 Virtual Visit Consent   Rebecca Lang, you are scheduled for a virtual visit with a [facility] provider today. Just as with appointments in the office, your consent must be obtained to participate. Your consent will be active for this visit and any virtual visit you may have with one of our providers in the next 365 days. If you have a MyChart account, a copy of this consent can be sent to you electronically.  As this is a virtual visit, video technology does not allow for your provider to perform a traditional examination. This may limit your provider's ability to fully assess your condition. If your provider identifies any concerns that need to be evaluated in person or the need to arrange testing (such as labs, EKG, etc.), we will make arrangements to do so. Although advances in technology are sophisticated, we cannot ensure that it will always work on either your end or our end. If the connection with a video visit is poor, the visit may have to be switched to a telephone visit. With either a video or telephone visit, we are not always able to ensure that we have a secure connection.  By engaging in this virtual visit, you consent to the provision of healthcare and authorize for your insurance to be billed (if applicable) for the services provided during this visit. Depending on your insurance coverage, you may receive a charge related to this service.  I need to obtain your verbal consent now. Are you willing to proceed with your visit today? Rebecca Lang has provided verbal consent on 01/09/2024 for a virtual visit (video or telephone). Roosvelt Mater, [place]  Date: 01/09/2024 2:07 PM   Virtual Visit via Video Note   I, Roosvelt Mater, connected with  Rebecca Lang  ([id number], [birth year]) on 01/09/24 at  2:00 PM EDT by a video-enabled telemedicine application and verified that I am speaking with the correct person using two identifiers.  Location: Patient: Virtual Visit Location  Patient: Home Provider: Virtual Visit Location Provider: Home Office   I discussed the limitations of evaluation and management by telemedicine and the availability of in person appointments. The patient expressed understanding and agreed to proceed.    History of Present Illness: Rebecca Lang is a 48 y.o. who identifies as a female who was assigned female at birth, and is being seen today for c/o having a couple of insect bites and not sure what insect it was.  Pt states they are hard, they sting and are leaking yellow fluid.  Pt states the bites are on her calf area. Pt states the area stings.  Pt denies fever, chills or N/V.  The area around the skin is warm to the touch.   HPI: HPI  Problems:  Patient Active Problem List   Diagnosis Date Noted   Panic attacks 12/17/2021   Depression, major, single episode, severe (HCC) 12/17/2021   Adjustment insomnia 12/17/2021   Elevated blood pressure reading without diagnosis of hypertension 12/17/2021   Bradycardia 03/20/2021   Fatigue 03/20/2021   Weight gain 03/20/2021   Galactorrhea of both breasts 01/18/2020   Light headedness 01/18/2020   Vitamin D insufficiency 01/18/2020   Hypoglycemia 01/18/2020   Menorrhagia with regular cycle 12/16/2019   Fracture of foot 12/16/2019   Blood sugar increased 12/16/2019   Encounter for screening mammogram for malignant neoplasm of breast 12/16/2019   History of Roux-en-Y gastric bypass- around age 31 12/16/2019   History of ectopic pregnancy 12/16/2019   Ectopic  pregnancy, tubal 12/03/2018   Iron  deficiency anemia 03/02/2018    Allergies:  Allergies  Allergen Reactions   Clindamycin /Lincomycin     Rash   Medications:  Current Outpatient Medications:    amoxicillin -clavulanate (AUGMENTIN ) 875-125 MG tablet, Take 1 tablet by mouth 2 (two) times daily for 7 days., Disp: 14 tablet, Rfl: 0   cyanocobalamin  (VITAMIN B12) 1000 MCG/ML injection, Inject 1 mL (1,000 mcg total) into the muscle every  30 (thirty) days., Disp: 4 mL, Rfl: 0   Needles & Syringes MISC, 1 mL by Does not apply route every 30 (thirty) days., Disp: 4 Dose, Rfl: 0  Observations/Objective: Patient is well-developed, well-nourished in no acute distress.  Resting comfortably at home.  Head is normocephalic, atraumatic.  No labored breathing.  Speech is clear and coherent with logical content.  Patient is alert and oriented at baseline.    Assessment and Plan: 1. Cellulitis of lower extremity, unspecified laterality (Primary) - amoxicillin -clavulanate (AUGMENTIN ) 875-125 MG tablet; Take 1 tablet by mouth 2 (two) times daily for 7 days.  Dispense: 14 tablet; Refill: 0  2. Insect bite of lower leg, unspecified laterality, initial encounter - amoxicillin -clavulanate (AUGMENTIN ) 875-125 MG tablet; Take 1 tablet by mouth 2 (two) times daily for 7 days.  Dispense: 14 tablet; Refill: 0  Start Augmentin  -Advised Pt to proceed to in person urgent care or emergency room for worsening symptoms  Follow Up Instructions: I discussed the assessment and treatment plan with the patient. The patient was provided an opportunity to ask questions and all were answered. The patient agreed with the plan and demonstrated an understanding of the instructions.  A copy of instructions were sent to the patient via MyChart unless otherwise noted below.    The patient was advised to call back or seek an in-person evaluation if the symptoms worsen or if the condition fails to improve as anticipated.    Roosvelt Mater, PA-C

## 2024-01-26 ENCOUNTER — Encounter: Payer: Self-pay | Admitting: Oncology

## 2024-01-28 ENCOUNTER — Ambulatory Visit: Admitting: Cardiology

## 2024-01-28 NOTE — Progress Notes (Deleted)
 Cardiology Office Note   Date:  01/28/2024  ID:  Rebecca Lang, DOB 06-26-1976, MRN [id number] PCP: Emilio Kelly DASEN, FNP  [facility] Providers Cardiologist:  Redell Cave, MD { Click to update primary MD,subspecialty MD or APP then REFRESH:1}    History of Present Illness Rebecca Lang is a 48 y.o. female with past medical history of iron  deficiency anemia, depression, bradycardia, fatigue, is here today for follow-up.   Was initially seen in clinic 03/25/2021 by Dr.Agbor-Etang.  At that time she was referred by her PCP for bradycardia.  She was noted to have heart rates in the 50s on exam.  Repeat heart rate was in the 40s.  She endorsed some fatigue.  At that time EKG showed sinus pericardia with a heart rate of 51.  She states gaining 25 pounds over the last few months.  Endorsing being very active and eating healthy but still not able to lose weight and would like to to try other options.  She follows up with the cancer center [facility] iron  infusions to help manage her anemia.  Overall she states feeling heavy but denies any chest pain dizziness or syncope.  There were no medication changes that were made or further testing and that was ordered at that time.  Has been lost to follow-up since that time.   She was evaluated at [facility] emergency department/12/25 complaining of left-sided chest pain radiating to her left arm into her left back for 1 day.  States pain started the day before and is intermittent.  Went to urgent care prior to presenting to the emergency department PVCs were noted on twelve-lead EKG.  Vital signs were stable.  Initial labs were reassuring.  CT scan without PTE or aortic pathology.  1.1 cm pulmonary nodule was noted.  She was discharged with Protonix  40 mg daily.   She was last seen in clinic 10/29/2023 stating overall she had been doing well from cardiac perspective.  She continued to have some chest discomfort on the left side of her chest that wraps  around under her breasts and her back that is a sharp stabbing feeling that she thought was acid reflux.  She did not going back to urgent care where she was evaluated and her EKG revealed she was having PVCs and felt that she should follow-up in the emergency department.  She was evaluated in the emergency department with an unremarkable workup.  She was scheduled for an echocardiogram to evaluate any structural abnormalities.  She returns to clinic today  ROS: 10 point review of systems has been reviewed and considered negative except ones were listed in the HPI  Studies Reviewed     2D echo 11/30/2023 1. Left ventricular ejection fraction, by estimation, is 55 to 60%. The  left ventricle has normal function. The left ventricle has no regional  wall motion abnormalities. Left ventricular diastolic parameters were  normal. The average left ventricular  global longitudinal strain is -22.6 %. The global longitudinal strain is  normal.   2. Right ventricular systolic function is normal. The right ventricular  size is normal.   3. The mitral valve is normal in structure. Mild mitral valve  regurgitation.   4. The aortic valve is tricuspid. Aortic valve regurgitation is not  visualized.   5. The inferior vena cava is normal in size with greater than 50%  respiratory variability, suggesting right atrial pressure of 3 mmHg.   Risk Assessment/Calculations   No BP recorded.  {Refresh Note  OR Click here to enter BP  :1}***       Physical Exam VS:  There were no vitals taken for this visit.       Wt Readings from Last 3 Encounters:  11/26/23 212 lb (96.2 kg)  10/30/23 215 lb 8 oz (97.8 kg)  10/29/23 216 lb (98 kg)    GEN: Well nourished, well developed in no acute distress NECK: No JVD; No carotid bruits CARDIAC: ***RRR, no murmurs, rubs, gallops RESPIRATORY:  Clear to auscultation without rales, wheezing or rhonchi  ABDOMEN: Soft, non-tender, non-distended EXTREMITIES:  No edema; No  deformity   ASSESSMENT AND PLAN Atypical chest discomfort Elevated blood pressure without a diagnosis of hypertension PVCs Pulmonary nodule    {Are you ordering a CV Procedure (e.g. stress test, cath, DCCV, TEE, etc)?   Press F2        :[id number]}  Dispo: ***  Signed, Dick Hark, NP

## 2024-02-08 ENCOUNTER — Ambulatory Visit: Payer: Self-pay | Admitting: Family Medicine

## 2024-02-08 ENCOUNTER — Encounter: Payer: Self-pay | Admitting: Family Medicine

## 2024-02-08 ENCOUNTER — Ambulatory Visit (INDEPENDENT_AMBULATORY_CARE_PROVIDER_SITE_OTHER): Admitting: Family Medicine

## 2024-02-08 VITALS — BP 133/68 | HR 55 | Temp 98.6°F | Ht 65.0 in | Wt 215.0 lb

## 2024-02-08 DIAGNOSIS — F320 Major depressive disorder, single episode, mild: Secondary | ICD-10-CM

## 2024-02-08 DIAGNOSIS — N870 Mild cervical dysplasia: Secondary | ICD-10-CM

## 2024-02-08 DIAGNOSIS — E538 Deficiency of other specified B group vitamins: Secondary | ICD-10-CM

## 2024-02-08 DIAGNOSIS — F419 Anxiety disorder, unspecified: Secondary | ICD-10-CM

## 2024-02-08 DIAGNOSIS — R4189 Other symptoms and signs involving cognitive functions and awareness: Secondary | ICD-10-CM

## 2024-02-08 DIAGNOSIS — Z9884 Bariatric surgery status: Secondary | ICD-10-CM

## 2024-02-08 DIAGNOSIS — G479 Sleep disorder, unspecified: Secondary | ICD-10-CM | POA: Diagnosis not present

## 2024-02-08 DIAGNOSIS — N951 Menopausal and female climacteric states: Secondary | ICD-10-CM | POA: Diagnosis not present

## 2024-02-08 DIAGNOSIS — R87810 Cervical high risk human papillomavirus (HPV) DNA test positive: Secondary | ICD-10-CM

## 2024-02-08 DIAGNOSIS — E559 Vitamin D deficiency, unspecified: Secondary | ICD-10-CM

## 2024-02-08 DIAGNOSIS — Z1211 Encounter for screening for malignant neoplasm of colon: Secondary | ICD-10-CM

## 2024-02-08 DIAGNOSIS — N921 Excessive and frequent menstruation with irregular cycle: Secondary | ICD-10-CM | POA: Diagnosis not present

## 2024-02-08 DIAGNOSIS — Z130 Encounter for screening for diseases of the blood and blood-forming organs and certain disorders involving the immune mechanism: Secondary | ICD-10-CM

## 2024-02-08 DIAGNOSIS — Z136 Encounter for screening for cardiovascular disorders: Secondary | ICD-10-CM

## 2024-02-08 DIAGNOSIS — K219 Gastro-esophageal reflux disease without esophagitis: Secondary | ICD-10-CM

## 2024-02-08 DIAGNOSIS — Z1231 Encounter for screening mammogram for malignant neoplasm of breast: Secondary | ICD-10-CM

## 2024-02-08 DIAGNOSIS — D509 Iron deficiency anemia, unspecified: Secondary | ICD-10-CM

## 2024-02-08 MED ORDER — ESCITALOPRAM OXALATE 10 MG PO TABS: 10.0000 mg | ORAL_TABLET | Freq: Every day | ORAL | 1 refills | Status: DC

## 2024-02-08 MED ORDER — OMEPRAZOLE 20 MG PO CPDR: 20.0000 mg | DELAYED_RELEASE_CAPSULE | Freq: Every day | ORAL | 0 refills | Status: AC

## 2024-02-08 NOTE — Progress Notes (Signed)
 Established patient visit   Patient: Rebecca Lang   DOB: 1975/07/22   48 y.o. Female  MRN: [id number] Visit Date: 02/08/2024  Today's healthcare provider: LAURAINE LOISE BUOY, DO   Chief Complaint  Patient presents with   Vaginal Bleeding    Appointment made for vaginal bleeding that has actually resolved.  She states her last period lasted from 01/04/24 until last week.     Gastroesophageal Reflux    Patient complains that her stomach has been burning for the last few days every time she eats it causes it to burn, regardless of what the food is.     Brain Fog    Patient complains of feeling like she can't focus, concentrate, she feels overwhelmed and like there is brain fog.  She states she has difficulty comprehending what people are saying.   Anemia    Patient states she is often anemic.  She usually gets an infusion at the cancer center.  She states they have given her an infusion on 10/30/23 despite the numbers not being that bad   Subjective    HPI Rebecca Lang is a 48 year old female who presents with perimenopausal symptoms and recent vaginal bleeding.  She has been experiencing vaginal bleeding, which has lasted about a month, after a prolonged period of amenorrhea. Her menstrual cycles typically last two to three days, with the first two days being particularly uncomfortable. She has had occasional spotting but not consistent bleeding. She has not been officially diagnosed with menopause, as she has not gone a full twelve months without bleeding.  She reports significant brain fog, which she describes as 'unbearable,' impacting her ability to multitask and perform daily activities. She has always managed multiple jobs but now struggles with basic tasks. She also experiences hot flashes and wakes up in the middle of the night feeling cold, which has been occurring for about a year, though not every night.  She has a history of reflux, with recent symptoms of a burning  sensation in the chest area after eating, accompanied by nausea. These symptoms have been present for a few days, and she has not tried any medications for relief. She has not previously taken medications like omeprazole  or Protonix , despite having had a Roux-en-Y bypass.  She is currently following up with hematology for iron  deficiency and receives regular infusions. She has been on and off B12 injections for years and administers them monthly. Despite recent infusions, she reports not feeling better.  She mentions weight gain despite unchanged eating habits, fluctuating between 20 to 30 pounds. Her diet consists mainly of vegetables and chicken, with occasional pasta and bread. She eats one full meal a day and avoids fast food. She has a history of significant weight loss from 325 pounds and has maintained a lower weight with some fluctuations.  She experiences back and hip pain, which she attributes to recent weight gain. She also reports difficulty sleeping, feeling exhausted, and waking up multiple times during the night. No snoring or waking up with headaches.  She has a history of multiple pregnancies, miscarriages, and a tubal pregnancy. She has experienced galactorrhea since her first child, with varying discharge colors. She has had mammograms in the past due to concerns about breast discharge.      Medications: Outpatient Medications Prior to Visit  Medication Sig   cyanocobalamin  (VITAMIN B12) 1000 MCG/ML injection Inject 1 mL (1,000 mcg total) into the muscle every 30 (thirty) days.  Needles & Syringes MISC 1 mL by Does not apply route every 30 (thirty) days.   No facility-administered medications prior to visit.        Objective    BP 133/68 (BP Location: Right Arm, Patient Position: Sitting, Cuff Size: Large)   Pulse (!) 55   Temp 98.6 F (37 C) (Oral)   Ht 5' 5 (1.651 m)   Wt 215 lb (97.5 kg)   SpO2 100%   BMI 35.78 kg/m     Physical Exam Vitals and nursing  note reviewed.  Constitutional:      General: She is not in acute distress.    Appearance: Normal appearance.  HENT:     Head: Normocephalic and atraumatic.  Eyes:     General: No scleral icterus.    Conjunctiva/sclera: Conjunctivae normal.  Cardiovascular:     Rate and Rhythm: Normal rate.  Pulmonary:     Effort: Pulmonary effort is normal.  Neurological:     Mental Status: She is alert and oriented to person, place, and time. Mental status is at baseline.  Psychiatric:        Mood and Affect: Mood normal.        Behavior: Behavior normal.      No results found for any visits on 02/08/24.  Assessment & Plan    Perimenopausal vasomotor symptoms -     Escitalopram  Oxalate; Take 1 tablet (10 mg total) by mouth daily.  Dispense: 30 tablet; Refill: 1  Brain fog -     Escitalopram  Oxalate; Take 1 tablet (10 mg total) by mouth daily.  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  Dispense: 30 tablet; Refill: 1  Sleep disturbance  Perimenopausal  Menorrhagia with irregular cycle -     CBC -     Ambulatory referral to Gynecology  CIN I (cervical intraepithelial neoplasia I) -     Ambulatory referral to Gynecology  Cervical high risk HPV (human papillomavirus) test positive -     Ambulatory referral to Gynecology  History of Roux-en-Y gastric bypass- around age 36  Vitamin B12 deficiency -     Vitamin B12  Iron  deficiency anemia, unspecified iron  deficiency anemia type -     Iron , TIBC and Ferritin Panel -     CBC  Vitamin D insufficiency -     VITAMIN D 25 Hydroxy (Vit-D Deficiency, Fractures)  Screening for endocrine, metabolic and immunity disorder -     Comprehensive metabolic panel with GFR  Encounter for screening for cardiovascular disorders -     Lipid panel  Encounter for colorectal cancer screening -     Ambulatory referral  to Gastroenterology  Encounter for screening mammogram for breast cancer -     3D Screening Mammogram, Left and Right; Future  Gastroesophageal reflux disease, unspecified whether esophagitis present -     Omeprazole ; Take 1 capsule (20 mg total) by mouth daily before breakfast.  Dispense: 30 capsule; Refill: 0      Perimenopausal vasomotor symptoms Perimenopausal symptoms include brain fog, hot flashes, and intermittent vaginal spotting. Hormonal treatment considered but requires evaluation due to abnormal uterine bleeding with previous LEEP procedure biopsy showing CIN-1. SSRIs considered for brain fog and anxiety. Discussed escitalopram  side effects. - Refer to OB/GYN for evaluation of menopause symptoms and abnormal uterine bleeding. - Prescribe escitalopram  for brain fog and associated  anxiety. - Follow up in 4-6 weeks to assess response to escitalopram .  Depression; sleep disturbance Addressed as noted under perimenopausal symptoms with SSRI, Lexapro  10 mg daily.  Menorrhagia; CIN-1; cervical high risk HPV positive Patient experienced over 1 month of ongoing menstrual cycle with recent resolution.  In combination with history of CIN-1 positive biopsy and high risk HPV positive status, we will go ahead and refer patient back to gynecology for further evaluation.  She appears to have been lost to follow-up previously, possibly due to her insurance change at the time, which caused her provider to become out of network for her.  Offered to perform Pap smear today, which patient declined, stating she would wait to do it at the specialist's office.  Gastroesophageal Reflux Disease (GERD) Recent onset of burning chest sensation and nausea post-eating. Omeprazole  prescribed for symptom management. - Prescribe omeprazole  to be taken 30 minutes before meals for one month. - Reassess symptoms after one month of omeprazole  treatment.  Iron  Deficiency Anemia Reports no improvement  post-infusion. Continuing B12 injections. - Order repeat iron  panel and complete blood count. - Continue B12 injections as per current regimen (one dose left). - Follow up with hematology as scheduled.  Weight Gain Recent weight gain post-gastric bypass. Decreased activity due to fatigue. Discussed dietary habits.  General Health Maintenance Due for routine screenings.  Recent abnormal uterine bleeding combined with previous CIN-1 positive biopsy warrant OB/GYN follow-up. Discussed importance of screenings. - Refer for colonoscopy to screen for colon cancer. - Refer for mammogram. - Refer to OB/GYN for follow-up on abnormal uterine bleeding and biopsy results.    Return in about 6 weeks (around 03/21/2024) for Recheck .      I discussed the assessment and treatment plan with the patient  The patient was provided an opportunity to ask questions and all were answered. The patient agreed with the plan and demonstrated an understanding of the instructions.   The patient was advised to call back or seek an in-person evaluation if the symptoms worsen or if the condition fails to improve as anticipated.  Total time was 40 minutes. That includes chart review before the visit, the actual patient visit, and time spent on documentation after the visit.     LAURAINE LOISE BUOY, DO  [facility] [phone] (phone) [phone] (fax)  [facility]

## 2024-02-08 NOTE — Telephone Encounter (Signed)
 FYI Only or Action Required?: FYI only for provider.  Patient was last seen in primary care on 12/17/2021 by Emilio Kelly DASEN, FNP.  Called Nurse Triage reporting Gastroesophageal Reflux.  Symptoms began about a month ago.  Symptoms are: unchanged.  Triage Disposition: See PCP Within 2 Weeks  Patient/caregiver understands and will follow disposition?: Yes                        Copied from CRM [id number]. Topic: Clinical - Red Word Triage >> Feb 08, 2024  8:52 AM Tobias CROME wrote: Red Word that prompted transfer to Nurse Triage: burns when eating, extremely fatigued Reason for Disposition  Periods last > 7 days  Answer Assessment - Initial Assessment Questions This RN scheduled patient for an appointment today in office. This RN educated pt on new-worsening symptoms and when to call back/seek emergent care. Pt verbalized understanding and agrees to plan.     DESCRIPTION: Describe how you are feeling.     Past couple days burns when eating; past couple months has been very tired; can barely do one thing at a time, can't focus, completely overwhelmed all the time  Started period on June 23rd and has not completely stopped bleeding; not having to wear anything but every time I wipe its there   ONSET: When did these symptoms begin? (e.g., hours, days, weeks, months)     Fatigue and vaginal bleeding x1 month   NEW MEDICINES:  Have you started on any new medicines recently? (e.g., opioid pain medicines, benzodiazepines, muscle relaxants, antidepressants, antihistamines, neuroleptics, beta blockers)     No  OTHER SYMPTOMS: Do you have any other symptoms? (e.g., chest pain, fever, cough, SOB, vomiting, diarrhea, bleeding, other areas of pain)     Not sleeping well, exhausted all the time; intense hot flashes; pt states symptoms are similar to perimenopausal  Protocols used: Weakness (Generalized) and Fatigue-A-AH, Vaginal Bleeding - Abnormal-A-AH

## 2024-02-12 ENCOUNTER — Ambulatory Visit: Payer: Self-pay | Admitting: Family Medicine

## 2024-02-12 DIAGNOSIS — D518 Other vitamin B12 deficiency anemias: Secondary | ICD-10-CM

## 2024-02-12 DIAGNOSIS — E559 Vitamin D deficiency, unspecified: Secondary | ICD-10-CM

## 2024-02-12 DIAGNOSIS — Z9884 Bariatric surgery status: Secondary | ICD-10-CM

## 2024-02-12 LAB — LIPID PANEL: Chol/HDL Ratio: 2.2 ratio (ref 0.0–4.4)

## 2024-02-13 LAB — CBC
Hematocrit: 32.6 % — ABNORMAL LOW (ref 34.0–46.6)
Hemoglobin: 9.9 g/dL — ABNORMAL LOW (ref 11.1–15.9)
MCH: 24.8 pg — ABNORMAL LOW (ref 26.6–33.0)
MCHC: 30.4 g/dL — ABNORMAL LOW (ref 31.5–35.7)
MCV: 82 fL (ref 79–97)
Platelets: 368 x10E3/uL (ref 150–450)
RBC: 3.99 x10E6/uL (ref 3.77–5.28)
RDW: 15.4 % (ref 11.7–15.4)
WBC: 7.1 x10E3/uL (ref 3.4–10.8)

## 2024-02-13 LAB — IRON,TIBC AND FERRITIN PANEL
Ferritin: 8 ng/mL — ABNORMAL LOW (ref 15–150)
Iron Saturation: 9 % — CL (ref 15–55)
Iron: 38 ug/dL (ref 27–159)
Total Iron Binding Capacity: 426 ug/dL (ref 250–450)
UIBC: 388 ug/dL (ref 131–425)

## 2024-02-13 LAB — COMPREHENSIVE METABOLIC PANEL WITH GFR
ALT: 11 IU/L (ref 0–32)
AST: 16 IU/L (ref 0–40)
Albumin: 3.8 g/dL — ABNORMAL LOW (ref 3.9–4.9)
Alkaline Phosphatase: 96 IU/L (ref 44–121)
BUN/Creatinine Ratio: 18 (ref 9–23)
BUN: 11 mg/dL (ref 6–24)
Bilirubin Total: 0.4 mg/dL (ref 0.0–1.2)
CO2: 17 mmol/L — ABNORMAL LOW (ref 20–29)
Calcium: 9.2 mg/dL (ref 8.7–10.2)
Chloride: 107 mmol/L — ABNORMAL HIGH (ref 96–106)
Creatinine, Ser: 0.6 mg/dL (ref 0.57–1.00)
Globulin, Total: 2.5 g/dL (ref 1.5–4.5)
Glucose: 86 mg/dL (ref 70–99)
Potassium: 4.3 mmol/L (ref 3.5–5.2)
Sodium: 139 mmol/L (ref 134–144)
Total Protein: 6.3 g/dL (ref 6.0–8.5)
eGFR: 112 mL/min/1.73 (ref >59)

## 2024-02-13 LAB — LIPID PANEL
Chol/HDL Ratio: 2.2 ratio (ref 0.0–4.4)
Cholesterol, Total: 156 mg/dL (ref 100–199)
HDL: 72 mg/dL (ref >39)
LDL Chol Calc (NIH): 70 mg/dL (ref 0–99)
Triglycerides: 69 mg/dL (ref 0–149)
VLDL Cholesterol Cal: 14 mg/dL (ref 5–40)

## 2024-02-13 LAB — VITAMIN B12: Vitamin B-12: 187 pg/mL — ABNORMAL LOW (ref 232–1245)

## 2024-02-13 LAB — VITAMIN D 25 HYDROXY (VIT D DEFICIENCY, FRACTURES): Vit D, 25-Hydroxy: 22.1 ng/mL — ABNORMAL LOW (ref 30.0–100.0)

## 2024-02-17 MED ORDER — CYANOCOBALAMIN 1000 MCG/ML IJ SOLN: 1000.0000 ug | INTRAMUSCULAR | 0 refills | Status: AC

## 2024-02-17 MED ORDER — VITAMIN D (ERGOCALCIFEROL) 1.25 MG (50000 UNIT) PO CAPS: 50000.0000 [IU] | ORAL_CAPSULE | ORAL | 1 refills | Status: AC

## 2024-02-29 ENCOUNTER — Ambulatory Visit: Attending: Cardiology | Admitting: Cardiology

## 2024-02-29 NOTE — Progress Notes (Deleted)
 Cardiology Office Note    Date:  02/29/2024   ID:  TERYL GUBLER, DOB Jan 28, 1976, MRN [id number]  PCP:  Donzella Lauraine SAILOR, DO  Cardiologist:  Redell Cave, MD  Electrophysiologist:  None   Chief Complaint: Follow up  History of Present Illness:   Rebecca Lang is a 49 y.o. female with history of iron  deficiency anemia, depression, bradycardia, and fatigue who presents for follow up on testing.    Patient was initially seen in clinic 03/2021 by Dr. Cave.  At that time, she was referred by her PCP for bradycardia. She endorsed some fatigue.  At that time, EKG showed sinus bradycardia with heart rate of 51 bpm.  She endorsed significant weight gain over the past few months despite activity and healthy eating.  She has had several urgent care and emergency department visits over the years for varying complaints.  She was seen in the ED 10/2023 for complaint of left-sided chest pain radiating down her left arm and into her back.  PVCs were noted on twelve-lead EKG.  Labs were reassuring.  CT scan without PE or aortic pathology.  1.1 cm incidental nodule noted.  She was discharged with Protonix  for presumed symptoms related to acid reflux.   Patient was most recently seen in the cardiology clinic 10/29/2023 and denied any recurrent symptoms.  Echocardiogram was ordered and completed 11/30/2023 and showed normal LV systolic function with EF 55 to 60%, no RWMA, and mild MR.    Labs independently reviewed: 02/11/2024 Hgb 9.9, HCT 32.6, platelets 368, iron  saturation 9, BUN 11, creatinine 0.6, sodium 139, potassium 4.3, normal LFTs, TC 156, TG 69, HDL 72, LDL 70 10/30/2023 TSH and T4 wnl  Objective   Past Medical History:  Diagnosis Date   Anemia    Anxiety    Back pain    Depression    Hypertrophic cardiomyopathy (HCC)     Current Medications: No outpatient medications have been marked as taking for the 02/29/24 encounter (Appointment) with Gerard Frederick, NP.     Allergies:   Clindamycin /lincomycin   Social History   Socioeconomic History   Marital status: Single    Spouse name: Not on file   Number of children: Not on file   Years of education: Not on file   Highest education level: Not on file  Occupational History   Not on file  Tobacco Use   Smoking status: Never    Passive exposure: Yes   Smokeless tobacco: Never  Vaping Use   Vaping status: Never Used  Substance and Sexual Activity   Alcohol use: Yes    Comment: socially beer   Drug use: No   Sexual activity: Yes  Other Topics Concern   Not on file  Social History Narrative   Not on file   Social Drivers of Health   Financial Resource Strain: Not on file  Food Insecurity: Not on file  Transportation Needs: Not on file  Physical Activity: Not on file  Stress: Not on file  Social Connections: Not on file     Family History:  The patient's family history includes Arthritis in her mother; Behavior problems in her sister; Cancer in her paternal grandfather; Diabetes in her maternal grandfather; Intestinal polyp in her father; Kidney disease in her father and paternal grandmother; Other in her mother.  ROS:   12-point review of systems is negative unless otherwise noted in the HPI.  EKGs/Other Studies Reviewed:    Studies reviewed were summarized above. The  additional studies were reviewed today:  11/30/2023 Echo complete 1. Left ventricular ejection fraction, by estimation, is 55 to 60%. The  left ventricle has normal function. The left ventricle has no regional  wall motion abnormalities. Left ventricular diastolic parameters were  normal. The average left ventricular  global longitudinal strain is -22.6 %. The global longitudinal strain is  normal.   2. Right ventricular systolic function is normal. The right ventricular  size is normal.   3. The mitral valve is normal in structure. Mild mitral valve  regurgitation.   4. The aortic valve is tricuspid. Aortic  valve regurgitation is not  visualized.   5. The inferior vena cava is normal in size with greater than 50%  respiratory variability, suggesting right atrial pressure of 3 mmHg.   EKG:  EKG personally reviewed by me today    PHYSICAL EXAM:    VS:  There were no vitals taken for this visit.  BMI: There is no height or weight on file to calculate BMI.  Physical Exam  Wt Readings from Last 3 Encounters:  02/08/24 215 lb (97.5 kg)  11/26/23 212 lb (96.2 kg)  10/30/23 215 lb 8 oz (97.8 kg)        ASSESSMENT & PLAN:   Atypical chest pain   Elevated BP   PVCs    {Are you ordering a CV Procedure (e.g. stress test, cath, DCCV, TEE, etc)?   Press F2        :[id number]}   Disposition: F/u with Dr. Darliss or an APP in ***.   Medication Adjustments/Labs and Tests Ordered: Current medicines are reviewed at length with the patient today.  Concerns regarding medicines are outlined above. Medication changes, Labs and Tests ordered today are summarized above and listed in the Patient Instructions accessible in Encounters.   Bonney Lesley Maffucci, PA-C 02/29/2024 12:52 PM     [facility] [place] [address] [place], [place] [address] [phone]

## 2024-03-01 ENCOUNTER — Inpatient Hospital Stay: Attending: Oncology

## 2024-03-01 DIAGNOSIS — I422 Other hypertrophic cardiomyopathy: Secondary | ICD-10-CM | POA: Insufficient documentation

## 2024-03-01 DIAGNOSIS — D509 Iron deficiency anemia, unspecified: Secondary | ICD-10-CM | POA: Diagnosis present

## 2024-03-01 DIAGNOSIS — Z79899 Other long term (current) drug therapy: Secondary | ICD-10-CM | POA: Diagnosis not present

## 2024-03-01 DIAGNOSIS — E538 Deficiency of other specified B group vitamins: Secondary | ICD-10-CM | POA: Insufficient documentation

## 2024-03-01 DIAGNOSIS — Z809 Family history of malignant neoplasm, unspecified: Secondary | ICD-10-CM | POA: Diagnosis not present

## 2024-03-01 DIAGNOSIS — R5383 Other fatigue: Secondary | ICD-10-CM | POA: Diagnosis not present

## 2024-03-01 DIAGNOSIS — R911 Solitary pulmonary nodule: Secondary | ICD-10-CM | POA: Insufficient documentation

## 2024-03-01 LAB — CBC WITH DIFFERENTIAL/PLATELET
Abs Immature Granulocytes: 0.02 K/uL (ref 0.00–0.07)
Basophils Absolute: 0 K/uL (ref 0.0–0.1)
Basophils Relative: 0 %
Eosinophils Absolute: 0 K/uL (ref 0.0–0.5)
Eosinophils Relative: 1 %
HCT: 31.3 % — ABNORMAL LOW (ref 36.0–46.0)
Hemoglobin: 9.8 g/dL — ABNORMAL LOW (ref 12.0–15.0)
Immature Granulocytes: 0 %
Lymphocytes Relative: 28 %
Lymphs Abs: 1.5 K/uL (ref 0.7–4.0)
MCH: 25 pg — ABNORMAL LOW (ref 26.0–34.0)
MCHC: 31.3 g/dL (ref 30.0–36.0)
MCV: 79.8 fL — ABNORMAL LOW (ref 80.0–100.0)
Monocytes Absolute: 0.5 K/uL (ref 0.1–1.0)
Monocytes Relative: 8 %
Neutro Abs: 3.5 K/uL (ref 1.7–7.7)
Neutrophils Relative %: 63 %
Platelets: 289 K/uL (ref 150–400)
RBC: 3.92 MIL/uL (ref 3.87–5.11)
RDW: 17.2 % — ABNORMAL HIGH (ref 11.5–15.5)
WBC: 5.5 K/uL (ref 4.0–10.5)
nRBC: 0 % (ref 0.0–0.2)

## 2024-03-01 LAB — IRON AND TIBC
Iron: 32 ug/dL (ref 28–170)
Saturation Ratios: 7 % — ABNORMAL LOW (ref 10.4–31.8)
TIBC: 483 ug/dL — ABNORMAL HIGH (ref 250–450)
UIBC: 451 ug/dL

## 2024-03-01 LAB — FERRITIN: Ferritin: 4 ng/mL — ABNORMAL LOW (ref 11–307)

## 2024-03-03 ENCOUNTER — Ambulatory Visit

## 2024-03-03 ENCOUNTER — Ambulatory Visit: Admitting: Oncology

## 2024-03-08 ENCOUNTER — Inpatient Hospital Stay: Admitting: Oncology

## 2024-03-08 ENCOUNTER — Inpatient Hospital Stay

## 2024-03-09 ENCOUNTER — Encounter: Payer: Self-pay | Admitting: Oncology

## 2024-03-09 ENCOUNTER — Inpatient Hospital Stay (HOSPITAL_BASED_OUTPATIENT_CLINIC_OR_DEPARTMENT_OTHER): Admitting: Oncology

## 2024-03-09 ENCOUNTER — Inpatient Hospital Stay

## 2024-03-09 VITALS — BP 136/69 | HR 51

## 2024-03-09 VITALS — BP 119/59 | HR 58 | Temp 97.5°F | Resp 18 | Ht 65.0 in | Wt 213.0 lb

## 2024-03-09 DIAGNOSIS — D509 Iron deficiency anemia, unspecified: Secondary | ICD-10-CM | POA: Diagnosis not present

## 2024-03-09 MED ORDER — SODIUM CHLORIDE 0.9% FLUSH
10.0000 mL | Freq: Once | INTRAVENOUS | Status: AC | PRN
  Administered 2024-03-09: 10 mL
  Filled 2024-03-09: qty 10

## 2024-03-09 MED ORDER — IRON SUCROSE 20 MG/ML IV SOLN
200.0000 mg | Freq: Once | INTRAVENOUS | Status: AC
  Administered 2024-03-09: 200 mg via INTRAVENOUS
  Filled 2024-03-09: qty 10

## 2024-03-09 NOTE — Progress Notes (Unsigned)
 [facility]  [phone] [phone]  ID: Rebecca Lang Husband OB: 1975-07-29  MR#: [id number]  RDW#:[id number]  Patient Care Team: Donzella Lauraine SAILOR, DO as PCP - General (Family Medicine) Darliss Rogue, MD as PCP - Cardiology (Cardiology) Jacobo Rebecca PARAS, MD as Consulting Physician (Oncology)  CHIEF COMPLAINT: Iron  deficiency and B12 deficiency anemia.  INTERVAL HISTORY: Patient returns to clinic today for repeat laboratory work, further evaluation, consideration of IV Venofer .  She continues to complain of significant fatigue, but otherwise feels well.  She has no neurologic complaints.  She denies any recent fevers or illnesses.  She has a good appetite and denies weight loss.  She has no chest pain, shortness of breath, cough, or hemoptysis.  She denies any nausea, vomiting, constipation, or diarrhea.  She has no melena or hematochezia.  She has no urinary complaints.  Patient offers no further specific complaints today.  REVIEW OF SYSTEMS:   Review of Systems  Constitutional:  Positive for malaise/fatigue. Negative for fever and weight loss.  Respiratory: Negative.  Negative for cough and shortness of breath.   Cardiovascular: Negative.  Negative for chest pain and leg swelling.  Gastrointestinal: Negative.  Negative for abdominal pain, blood in stool and melena.  Genitourinary: Negative.  Negative for dysuria and hematuria.  Musculoskeletal: Negative.  Negative for back pain.  Skin: Negative.  Negative for rash.  Neurological:  Positive for weakness. Negative for focal weakness and headaches.  Psychiatric/Behavioral: Negative.  The patient is not nervous/anxious.     As per HPI. Otherwise, a complete review of systems is negative.  PAST MEDICAL HISTORY: Past Medical History:  Diagnosis Date   Anemia    Anxiety    Back pain    Depression    Hypertrophic cardiomyopathy (HCC)     PAST SURGICAL HISTORY: Past Surgical History:   Procedure Laterality Date   ABDOMINAL SURGERY  2004   gastric bypass   CESAREAN SECTION     DIAGNOSTIC LAPAROSCOPY WITH REMOVAL OF ECTOPIC PREGNANCY Right 12/03/2018   Procedure: DIAGNOSTIC LAPAROSCOPY WITH REMOVAL OF RIGHT ECTOPIC PREGNANCY AND RIGHT OVARY;  Surgeon: Verdon Keen, MD;  Location: [facility] ORS;  Service: Gynecology;  Laterality: Right;   INNER EAR SURGERY     LAPAROSCOPIC GASTRIC BYPASS  2004   tubal rupture      FAMILY HISTORY: Family History  Problem Relation Age of Onset   Arthritis Mother    Other Mother        back pain w stimulator ( internal )   Kidney disease Father    Intestinal polyp Father    Behavior problems Sister    Diabetes Maternal Grandfather    Kidney disease Paternal Grandmother    Cancer Paternal Grandfather     ADVANCED DIRECTIVES (Y/N):  N  HEALTH MAINTENANCE: Social History   Tobacco Use   Smoking status: Never    Passive exposure: Yes   Smokeless tobacco: Never  Vaping Use   Vaping status: Never Used  Substance Use Topics   Alcohol use: Yes    Comment: socially beer   Drug use: No     Colonoscopy:  PAP:  Bone density:  Lipid panel:  Allergies  Allergen Reactions   Clindamycin /Lincomycin     Rash    Current Outpatient Medications  Medication Sig Dispense Refill   cyanocobalamin  (VITAMIN B12) 1000 MCG/ML injection Inject 1 mL (1,000 mcg total) into the muscle every 30 (thirty) days. 4 mL 0   escitalopram  (LEXAPRO ) 10 MG tablet Take  1 tablet (10 mg total) by mouth daily. 30 tablet 1   Needles & Syringes MISC 1 mL by Does not apply route every 30 (thirty) days. 4 Dose 0   omeprazole  (PRILOSEC) 20 MG capsule Take 1 capsule (20 mg total) by mouth daily before breakfast. 30 capsule 0   Vitamin D , Ergocalciferol , (DRISDOL ) 1.25 MG (50000 UNIT) CAPS capsule Take 1 capsule (50,000 Units total) by mouth every 7 (seven) days. 12 capsule 1   No current facility-administered medications for this visit.    OBJECTIVE: Vitals:    03/09/24 1057  BP: (!) 119/59  Pulse: (!) 58  Resp: 18  Temp: (!) 97.5 F (36.4 C)  SpO2: 100%     Body mass index is 35.45 kg/m.    ECOG FS:1 - Symptomatic but completely ambulatory  General: Well-developed, well-nourished, no acute distress. Eyes: Pink conjunctiva, anicteric sclera. HEENT: Normocephalic, moist mucous membranes. Lungs: No audible wheezing or coughing. Heart: Regular rate and rhythm. Abdomen: Soft, nontender, no obvious distention. Musculoskeletal: No edema, cyanosis, or clubbing. Neuro: Alert, answering all questions appropriately. Cranial nerves grossly intact. Skin: No rashes or petechiae noted. Psych: Normal affect.  LAB RESULTS:  Lab Results  Component Value Date   NA 139 02/11/2024   K 4.3 02/11/2024   CL 107 (H) 02/11/2024   CO2 17 (L) 02/11/2024   GLUCOSE 86 02/11/2024   BUN 11 02/11/2024   CREATININE 0.60 02/11/2024   CALCIUM 9.2 02/11/2024   PROT 6.3 02/11/2024   ALBUMIN 3.8 (L) 02/11/2024   AST 16 02/11/2024   ALT 11 02/11/2024   ALKPHOS 96 02/11/2024   BILITOT 0.4 02/11/2024   GFRNONAA >60 10/24/2023   GFRAA 130 12/16/2019    Lab Results  Component Value Date   WBC 5.5 03/01/2024   NEUTROABS 3.5 03/01/2024   HGB 9.8 (L) 03/01/2024   HCT 31.3 (L) 03/01/2024   MCV 79.8 (L) 03/01/2024   PLT 289 03/01/2024   Lab Results  Component Value Date   IRON  32 03/01/2024   TIBC 483 (H) 03/01/2024   IRONPCTSAT 7 (L) 03/01/2024   Lab Results  Component Value Date   FERRITIN 4 (L) 03/01/2024     STUDIES: No results found.   ASSESSMENT: Iron  deficiency and B12 deficiency anemia  PLAN:   Iron  deficiency anemia: Likely secondary to poor absorption from patient's history of gastric bypass surgery.  Patient's hemoglobin and iron  stores remain decreased and she is symptomatic.  Proceed with 200 mg IV Venofer  today.  Return to clinic 4 times over the next 1 to 2 weeks for additional treatment.  Patient will then return to clinic in 4  months with repeat laboratory work, further evaluation, and continuation of treatment if needed.  B12 deficiency: Patient's most recent B12 levels were decreased.  She reports that she gives herself monthly B12 injections at home. Pulmonary nodule: Likely benign.  Follow-up with pulmonary as indicated.   Patient expressed understanding and was in agreement with this plan. She also understands that She can call clinic at any time with any questions, concerns, or complaints.    Rebecca JINNY Reusing, MD   03/09/2024 11:04 AM

## 2024-03-09 NOTE — Progress Notes (Signed)
 Patient declined to wait the 30 minutes for post iron infusion observation today. Tolerated infusion well. VSS.

## 2024-03-09 NOTE — Progress Notes (Unsigned)
 Patient has been having brain fog for a while now. Extremely tired. Pens and needles in her legs and feet at night.

## 2024-03-10 ENCOUNTER — Encounter: Payer: Self-pay | Admitting: Oncology

## 2024-03-11 ENCOUNTER — Inpatient Hospital Stay

## 2024-03-11 VITALS — BP 133/68 | HR 57 | Resp 18

## 2024-03-11 DIAGNOSIS — D509 Iron deficiency anemia, unspecified: Secondary | ICD-10-CM | POA: Diagnosis not present

## 2024-03-11 MED ORDER — IRON SUCROSE 20 MG/ML IV SOLN
200.0000 mg | Freq: Once | INTRAVENOUS | Status: AC
  Administered 2024-03-11: 200 mg via INTRAVENOUS
  Filled 2024-03-11: qty 10

## 2024-03-17 ENCOUNTER — Inpatient Hospital Stay

## 2024-03-21 ENCOUNTER — Ambulatory Visit (INDEPENDENT_AMBULATORY_CARE_PROVIDER_SITE_OTHER): Admitting: Family Medicine

## 2024-03-21 ENCOUNTER — Encounter: Payer: Self-pay | Admitting: Family Medicine

## 2024-03-21 VITALS — BP 129/75 | HR 61 | Ht 65.0 in | Wt 214.0 lb

## 2024-03-21 DIAGNOSIS — D509 Iron deficiency anemia, unspecified: Secondary | ICD-10-CM

## 2024-03-21 DIAGNOSIS — Z9884 Bariatric surgery status: Secondary | ICD-10-CM

## 2024-03-21 DIAGNOSIS — N951 Menopausal and female climacteric states: Secondary | ICD-10-CM | POA: Diagnosis not present

## 2024-03-21 DIAGNOSIS — D518 Other vitamin B12 deficiency anemias: Secondary | ICD-10-CM

## 2024-03-21 DIAGNOSIS — E559 Vitamin D deficiency, unspecified: Secondary | ICD-10-CM

## 2024-03-21 DIAGNOSIS — R5383 Other fatigue: Secondary | ICD-10-CM

## 2024-03-21 MED ORDER — SERTRALINE HCL 50 MG PO TABS: ORAL_TABLET | ORAL | 1 refills | Status: DC

## 2024-03-21 NOTE — Progress Notes (Signed)
 Established patient visit   Patient: Rebecca Lang   DOB: [birth year]   48 y.o. Female  MRN: [id number] Visit Date: 03/21/2024  Today's healthcare provider: LAURAINE LOISE BUOY, DO   Chief Complaint  Patient presents with   Menopause    Possible peri menopause   Subjective    HPI Rebecca Lang is a 48 year old female who presents with perimenopausal symptoms and associated fatigue.  She has been experiencing perimenopausal symptoms, including fatigue, memory issues, and occasional hot flashes, for approximately two years. The severity of these symptoms has progressively worsened. She describes persistent exhaustion, even after resting, and experiences brain fog, making it difficult to remember things and complete tasks she previously managed easily.  She is currently taking Lexapro  but feels it exacerbates her symptoms. She is also on vitamin B12 injections and vitamin D  supplements, both of which she has not been able to pick up recently. She has been receiving iron  infusions, which she believes have been beneficial.  She experiences periodic hot flashes, especially at night, waking up drenched in sweat. Over the past year, she has gained weight, with fluctuations between 180 and 214 pounds. She becomes winded easily during physical activities, such as walking up a hill or spending extended periods at the grocery store. She occasionally experiences swelling in her legs, which is inconsistent.  Her sleep is not restful, as she wakes up periodically and sometimes struggles to fall asleep despite feeling exhausted. She has a history of significant weight fluctuations since undergoing surgery over 20 years ago, with her weight typically varying within a 30-pound range. Her current weight gain is resistant to her usual weight loss efforts.  She believes she has not been experiencing feelings of depression, but she acknowledges anxiety due to her inability to perform daily tasks. No  recent changes in her menstrual cycle, describing it as 'normal for me' with three days of heavy bleeding followed by spotting. No recent vaginal bleeding outside of her normal cycle.      Medications: Outpatient Medications Prior to Visit  Medication Sig Note   cyanocobalamin  (VITAMIN B12) 1000 MCG/ML injection Inject 1 mL (1,000 mcg total) into the muscle every 30 (thirty) days.    Needles & Syringes MISC 1 mL by Does not apply route every 30 (thirty) days.    omeprazole  (PRILOSEC) 20 MG capsule Take 1 capsule (20 mg total) by mouth daily before breakfast.    Vitamin D , Ergocalciferol , (DRISDOL ) 1.25 MG (50000 UNIT) CAPS capsule Take 1 capsule (50,000 Units total) by mouth every 7 (seven) days.    [DISCONTINUED] escitalopram  (LEXAPRO ) 10 MG tablet Take 1 tablet (10 mg total) by mouth daily. 03/21/2024: worse symptoms   No facility-administered medications prior to visit.        Objective    BP 129/75 (BP Location: Left Arm, Patient Position: Sitting, Cuff Size: Normal)   Pulse 61   Ht 5' 5 (1.651 m)   Wt 214 lb (97.1 kg)   SpO2 100%   BMI 35.61 kg/m     Physical Exam Vitals and nursing note reviewed.  Constitutional:      General: She is not in acute distress.    Appearance: Normal appearance.  HENT:     Head: Normocephalic and atraumatic.  Eyes:     General: No scleral icterus.    Conjunctiva/sclera: Conjunctivae normal.  Cardiovascular:     Rate and Rhythm: Normal rate.  Pulmonary:     Effort: Pulmonary effort  is normal.  Neurological:     Mental Status: She is alert and oriented to person, place, and time. Mental status is at baseline.  Psychiatric:        Mood and Affect: Mood normal.        Behavior: Behavior normal.      No results found for any visits on 03/21/24.  Assessment & Plan    Perimenopausal vasomotor symptoms -     Sertraline  HCl; Take 0.5 tablet for 8 days, then increase to one tablet daily.  Dispense: 30 tablet; Refill: 1  History of  Roux-en-Y gastric bypass- around age 7 -     Vitamin B1 -     Vitamin B6  Fatigue, unspecified type -     Vitamin B1 -     Vitamin B6  Vitamin D  insufficiency  Vitamin B12 deficiency (dietary) anemia  Iron  deficiency anemia, unspecified iron  deficiency anemia type      Perimenopausal symptoms with chronic fatigue and cognitive impairment Chronic fatigue and cognitive impairment likely due to perimenopausal symptoms. Lexapro  was ineffective and worsened symptoms. Alternative SSRI treatment considered. Hormonal therapy deferred pending OB GYN consultation. SSRIs like sertraline  may take 4-8 weeks for full effect. Echocardiogram in May 2025 was overall unremarkable. - Discontinue Lexapro . - Start sertraline , half tablet for eight days, then full tablet. - Follow-up in six weeks to assess sertraline  response and symptom management. - Coordinate with OB GYN for consideration of hormonal therapy if sertraline  ineffective.  Vitamin B12 deficiency Vitamin B12 deficiency contributing to fatigue and cognitive impairment. Inconsistent with B12 injections due to forgetfulness. Emphasized B12 importance in managing symptoms. - Restart vitamin B12 injections at home.  Vitamin D  deficiency Vitamin D  deficiency contributing to fatigue. Inconsistent with supplementation. Emphasized vitamin D  role in reducing fatigue. - Restart vitamin D  supplementation.  Iron  deficiency anemia (on iron  infusions) Iron  deficiency anemia managed with iron  infusions. Full series administered to optimize iron  levels. - Continue scheduled iron  infusions.  Obesity, post-bariatric surgery Weight gain over past year, current weight 214 lbs. Weight gain may contribute to fatigue and decreased physical activity tolerance. Discussed impact of weight on fatigue and importance of lifestyle modifications.    Return in about 6 weeks (around 05/02/2024) for Perimeno sx.      I discussed the assessment and treatment plan  with the patient  The patient was provided an opportunity to ask questions and all were answered. The patient agreed with the plan and demonstrated an understanding of the instructions.   The patient was advised to call back or seek an in-person evaluation if the symptoms worsen or if the condition fails to improve as anticipated.    LAURAINE LOISE BUOY, DO  [facility] [phone] (phone) [phone] (fax)  [facility]

## 2024-03-21 NOTE — Patient Instructions (Signed)
 Pick up vitamin D  and vitamin B12 from pharmacy

## 2024-03-22 ENCOUNTER — Inpatient Hospital Stay: Attending: Oncology

## 2024-03-22 VITALS — BP 123/67 | HR 56 | Temp 98.8°F

## 2024-03-22 DIAGNOSIS — D509 Iron deficiency anemia, unspecified: Secondary | ICD-10-CM | POA: Insufficient documentation

## 2024-03-22 DIAGNOSIS — Z79899 Other long term (current) drug therapy: Secondary | ICD-10-CM | POA: Diagnosis not present

## 2024-03-22 MED ORDER — SODIUM CHLORIDE 0.9% FLUSH
10.0000 mL | Freq: Once | INTRAVENOUS | Status: AC | PRN
  Administered 2024-03-22: 10 mL
  Filled 2024-03-22: qty 10

## 2024-03-22 MED ORDER — IRON SUCROSE 20 MG/ML IV SOLN
200.0000 mg | Freq: Once | INTRAVENOUS | Status: AC
  Administered 2024-03-22: 200 mg via INTRAVENOUS
  Filled 2024-03-22: qty 10

## 2024-03-22 NOTE — Patient Instructions (Signed)

## 2024-03-22 NOTE — Progress Notes (Signed)
 Patient tolerated Venofer  infusion well. Explained recommendation of 30 min post monitoring. Patient refused to wait post monitoring. Educated on what signs to watch for & to call with any concerns. No questions, discharged. Stable

## 2024-03-24 ENCOUNTER — Inpatient Hospital Stay

## 2024-03-24 VITALS — BP 140/67 | HR 60 | Temp 95.0°F | Resp 19

## 2024-03-24 DIAGNOSIS — D509 Iron deficiency anemia, unspecified: Secondary | ICD-10-CM | POA: Diagnosis not present

## 2024-03-24 MED ORDER — IRON SUCROSE 20 MG/ML IV SOLN
200.0000 mg | Freq: Once | INTRAVENOUS | Status: AC
  Administered 2024-03-24: 200 mg via INTRAVENOUS
  Filled 2024-03-24: qty 10

## 2024-03-24 MED ORDER — SODIUM CHLORIDE 0.9% FLUSH
10.0000 mL | Freq: Once | INTRAVENOUS | Status: AC | PRN
  Administered 2024-03-24: 10 mL
  Filled 2024-03-24: qty 10

## 2024-03-28 ENCOUNTER — Inpatient Hospital Stay

## 2024-03-28 ENCOUNTER — Ambulatory Visit: Payer: Self-pay | Admitting: Family Medicine

## 2024-03-28 VITALS — BP 119/63 | HR 52 | Temp 98.4°F | Resp 18

## 2024-03-28 DIAGNOSIS — D509 Iron deficiency anemia, unspecified: Secondary | ICD-10-CM

## 2024-03-28 LAB — VITAMIN B1: Thiamine: 117.1 nmol/L (ref 66.5–200.0)

## 2024-03-28 LAB — VITAMIN B6: Vitamin B6: 7.6 ug/L (ref 3.4–65.2)

## 2024-03-28 MED ORDER — SODIUM CHLORIDE 0.9% FLUSH
10.0000 mL | Freq: Once | INTRAVENOUS | Status: AC | PRN
  Administered 2024-03-28: 10 mL
  Filled 2024-03-28: qty 10

## 2024-03-28 MED ORDER — IRON SUCROSE 20 MG/ML IV SOLN
200.0000 mg | Freq: Once | INTRAVENOUS | Status: AC
  Administered 2024-03-28: 200 mg via INTRAVENOUS
  Filled 2024-03-28: qty 10

## 2024-03-28 NOTE — Progress Notes (Signed)
 Patient declined to wait the 30 minutes for post iron infusion observation today. Tolerated infusion well. VSS.

## 2024-05-02 ENCOUNTER — Ambulatory Visit: Admitting: Family Medicine

## 2024-05-04 ENCOUNTER — Encounter: Payer: Self-pay | Admitting: Family Medicine

## 2024-05-04 ENCOUNTER — Ambulatory Visit (INDEPENDENT_AMBULATORY_CARE_PROVIDER_SITE_OTHER): Admitting: Family Medicine

## 2024-05-04 VITALS — BP 115/80 | HR 61 | Temp 98.9°F | Ht 65.0 in | Wt 219.5 lb

## 2024-05-04 DIAGNOSIS — D509 Iron deficiency anemia, unspecified: Secondary | ICD-10-CM

## 2024-05-04 DIAGNOSIS — F902 Attention-deficit hyperactivity disorder, combined type: Secondary | ICD-10-CM | POA: Diagnosis not present

## 2024-05-04 DIAGNOSIS — Z23 Encounter for immunization: Secondary | ICD-10-CM

## 2024-05-04 DIAGNOSIS — D518 Other vitamin B12 deficiency anemias: Secondary | ICD-10-CM

## 2024-05-04 DIAGNOSIS — E559 Vitamin D deficiency, unspecified: Secondary | ICD-10-CM

## 2024-05-04 DIAGNOSIS — Z1211 Encounter for screening for malignant neoplasm of colon: Secondary | ICD-10-CM

## 2024-05-04 NOTE — Patient Instructions (Signed)
 Please call the [facility] [phone]) to schedule a routine screening mammogram.

## 2024-05-04 NOTE — Progress Notes (Signed)
 Established patient visit   Patient: Rebecca Lang   DOB: Oct 04, 1975   48 y.o. Female  MRN: [id number] Visit Date: 05/04/2024  Today's healthcare provider: LAURAINE LOISE BUOY, DO   Chief Complaint  Patient presents with   Follow-up    Patient is here for 6 weeks for perimenopausal symptoms, went to OBGYN she was put on estrodiol patches and progesterone.  States that she is feeling a 100% better the only problem that she is having problems trying to focus.  Reports that she could almost tell immediately the difference from being put on those medications.   Tdap-yes Hepatitis B-yes  Colonoscopy- put in referral   Subjective    HPI Rebecca Lang is a 48 year old female who presents with difficulty focusing and concerns about possible ADHD.  She has been experiencing ongoing difficulty focusing, which has become a significant concern as she is currently in her fifth year of school. She has not been previously diagnosed with ADHD but is experiencing challenges in maintaining attention and concentration. Historically, she performed well academically without much effort but now finds it difficult to focus. She describes her past work experience as having multiple jobs simultaneously and being able to hyper-focus, but currently feels she is not able to do so.  She reports a history of hypertrophic cardiomyopathy diagnosed in 1999, which she recalls was attributed by her cardiologist at the time to stress from a traumatic event. She was under cardiology care during her pregnancy in 2015 due to her heart condition but has not had significant issues since then. She recalls a recent report indicating a slightly enlarged heart but has not experienced further cardiac symptoms. No current cardiac symptoms are reported. - Echocardiogram 11/30/2023 was fairly unremarkable overall, with normal ejection fraction, no wall motion abnormalities, and only mild leakage of the mitral valve noted.  She  has been receiving iron  infusions and B12 injections, with the last B12 injection occurring recently. She has not restarted sertraline . She occasionally takes vitamin D  but forgets to do so regularly. She has not had any recent issues with eating or gastrointestinal symptoms. She reports improved physical health, including sleeping through the night and an inability to nap during the day, which she attributes to starting a estradiol patches.       Medications: Outpatient Medications Prior to Visit  Medication Sig   estradiol (CLIMARA - DOSED IN MG/24 HR) 0.1 mg/24hr patch Place 0.1 mg onto the skin once a week.   progesterone (PROMETRIUM) 200 MG capsule Take 200 mg by mouth daily.   cyanocobalamin  (VITAMIN B12) 1000 MCG/ML injection Inject 1 mL (1,000 mcg total) into the muscle every 30 (thirty) days.   Needles & Syringes MISC 1 mL by Does not apply route every 30 (thirty) days.   omeprazole  (PRILOSEC) 20 MG capsule Take 1 capsule (20 mg total) by mouth daily before breakfast.   Vitamin D , Ergocalciferol , (DRISDOL ) 1.25 MG (50000 UNIT) CAPS capsule Take 1 capsule (50,000 Units total) by mouth every 7 (seven) days.   [DISCONTINUED] sertraline  (ZOLOFT ) 50 MG tablet Take 0.5 tablet for 8 days, then increase to one tablet daily.   No facility-administered medications prior to visit.        Objective    BP 115/80 (BP Location: Right Arm, Patient Position: Sitting, Cuff Size: Normal)   Pulse 61   Temp 98.9 F (37.2 C) (Oral)   Ht 5' 5 (1.651 m)   Wt 219 lb 8 oz (  99.6 kg)   SpO2 98%   BMI 36.53 kg/m     Physical Exam Vitals and nursing note reviewed.  Constitutional:      General: She is not in acute distress.    Appearance: Normal appearance.  HENT:     Head: Normocephalic and atraumatic.  Eyes:     General: No scleral icterus.    Conjunctiva/sclera: Conjunctivae normal.  Cardiovascular:     Rate and Rhythm: Normal rate.  Pulmonary:     Effort: Pulmonary effort is normal.   Neurological:     Mental Status: She is alert and oriented to person, place, and time. Mental status is at baseline.  Psychiatric:        Mood and Affect: Mood normal.        Behavior: Behavior normal.      No results found for any visits on 05/04/24.  Assessment & Plan    Attention deficit hyperactivity disorder (ADHD), combined type -     Ambulatory referral to Psychiatry  Iron  deficiency anemia, unspecified iron  deficiency anemia type  Vitamin B12 deficiency (dietary) anemia -     Vitamin B12; Future  Vitamin D  deficiency  Need for Tdap vaccination -     Tdap vaccine greater than or equal to 7yo IM  Normal screening colonoscopy -     Ambulatory referral to Gastroenterology  Need for hepatitis B vaccination -     Heplisav-B (HepB-CPG) Vaccine     Attention deficit hyperactivity disorder (ADHD), combined type Ongoing focus and attention issues, possibly ADHD or perimenopause-related. No prior ADHD diagnosis. Significant academic challenges. Initial assessment with self-reporting questionnaire. - Provide self-reporting questionnaire for ADHD assessment. - Refer for comprehensive ADHD evaluation if indicated.  Iron  Deficiency Treated with Infusions Completed iron  infusions. Follow-up planned for December to evaluate treatment effectiveness. - Order blood work to assess iron  levels in December.  Vitamin B12 Deficiency Receiving B12 injections. Follow-up B12 level to be checked in December. - Order B12 level assessment in December.  Vitamin D  Deficiency Prescribed once-weekly vitamin D  supplement. Reports inconsistent adherence. - Encourage setting reminders for vitamin D  supplementation. - Recheck level when prescription vitamin D  is completed.     Return if symptoms worsen or fail to improve.      I discussed the assessment and treatment plan with the patient  The patient was provided an opportunity to ask questions and all were answered. The patient agreed  with the plan and demonstrated an understanding of the instructions.   The patient was advised to call back or seek an in-person evaluation if the symptoms worsen or if the condition fails to improve as anticipated.    Rebecca LOISE BUOY, DO  [facility] [phone] (phone) [phone] (fax)  [facility]

## 2024-05-16 ENCOUNTER — Encounter: Payer: Self-pay | Admitting: Family Medicine

## 2024-06-14 ENCOUNTER — Other Ambulatory Visit: Payer: Self-pay | Admitting: Oncology

## 2024-06-22 ENCOUNTER — Telehealth: Payer: Self-pay | Admitting: Oncology

## 2024-06-22 NOTE — Telephone Encounter (Signed)
 Pt was scheduled 12/30 lab and 12/31 md/iron   Due to the holidays we are not supposed to schedule iron  day before/after holidays and we need chairs for chemo pts.   I called and spoke with pt to r/s her appts to January, pt agreed. Dates/times confirmed with pt.  Pt also mentioned that her pcp wants us  to draw labs for them here.

## 2024-07-12 ENCOUNTER — Other Ambulatory Visit

## 2024-07-13 ENCOUNTER — Ambulatory Visit

## 2024-07-13 ENCOUNTER — Ambulatory Visit: Admitting: Oncology

## 2024-07-14 ENCOUNTER — Encounter: Payer: Self-pay | Admitting: Oncology

## 2024-07-16 ENCOUNTER — Other Ambulatory Visit: Payer: Self-pay | Admitting: Family Medicine

## 2024-07-16 DIAGNOSIS — Z9884 Bariatric surgery status: Secondary | ICD-10-CM

## 2024-07-16 DIAGNOSIS — D518 Other vitamin B12 deficiency anemias: Secondary | ICD-10-CM

## 2024-07-18 ENCOUNTER — Encounter: Payer: Self-pay | Admitting: Oncology

## 2024-07-18 NOTE — Telephone Encounter (Signed)
 LOV- 05/04/2024 NOV- None [facility]- 02/17/2024 Outpatient Medication Detail   Disp Refills Start End   cyanocobalamin  (VITAMIN B12) 1000 MCG/ML injection 4 mL 0 02/17/2024 --   Sig - Route: Inject 1 mL (1,000 mcg total) into the muscle every 30 (thirty) days. - Intramuscular   Sent to pharmacy as: cyanocobalamin  (VITAMIN B12) 1000 MCG/ML injection   E-Prescribing Status: Receipt confirmed by pharmacy (02/17/2024  7:51 AM EDT)    Last Vitamin B12 level- Order for 05/04/2024 not drawn, 02/11/2024 was 187.

## 2024-07-19 ENCOUNTER — Inpatient Hospital Stay: Attending: Oncology

## 2024-07-19 DIAGNOSIS — D509 Iron deficiency anemia, unspecified: Secondary | ICD-10-CM | POA: Insufficient documentation

## 2024-07-19 LAB — CBC WITH DIFFERENTIAL/PLATELET
Abs Immature Granulocytes: 0.02 K/uL (ref 0.00–0.07)
Basophils Absolute: 0 K/uL (ref 0.0–0.1)
Basophils Relative: 0 %
Eosinophils Absolute: 0.1 K/uL (ref 0.0–0.5)
Eosinophils Relative: 1 %
HCT: 36.7 % (ref 36.0–46.0)
Hemoglobin: 12.3 g/dL (ref 12.0–15.0)
Immature Granulocytes: 0 %
Lymphocytes Relative: 24 %
Lymphs Abs: 1.5 K/uL (ref 0.7–4.0)
MCH: 31.3 pg (ref 26.0–34.0)
MCHC: 33.5 g/dL (ref 30.0–36.0)
MCV: 93.4 fL (ref 80.0–100.0)
Monocytes Absolute: 0.4 K/uL (ref 0.1–1.0)
Monocytes Relative: 6 %
Neutro Abs: 4.3 K/uL (ref 1.7–7.7)
Neutrophils Relative %: 69 %
Platelets: 302 K/uL (ref 150–400)
RBC: 3.93 MIL/uL (ref 3.87–5.11)
RDW: 13.2 % (ref 11.5–15.5)
WBC: 6.3 K/uL (ref 4.0–10.5)
nRBC: 0 % (ref 0.0–0.2)

## 2024-07-19 LAB — IRON AND TIBC
Iron: 119 ug/dL (ref 28–170)
Saturation Ratios: 35 % — ABNORMAL HIGH (ref 10.4–31.8)
TIBC: 344 ug/dL (ref 250–450)
UIBC: 225 ug/dL

## 2024-07-19 LAB — FERRITIN: Ferritin: 51 ng/mL (ref 11–307)

## 2024-07-20 ENCOUNTER — Inpatient Hospital Stay: Admitting: Oncology

## 2024-07-20 ENCOUNTER — Inpatient Hospital Stay

## 2024-08-19 ENCOUNTER — Encounter: Payer: Self-pay | Admitting: Oncology

## 2024-09-09 ENCOUNTER — Encounter

## 2024-11-23 ENCOUNTER — Inpatient Hospital Stay

## 2024-11-24 ENCOUNTER — Inpatient Hospital Stay: Admitting: Oncology

## 2024-11-24 ENCOUNTER — Inpatient Hospital Stay
